# Patient Record
Sex: Female | Born: 1940 | Race: White | Hispanic: No | Smoking: Former smoker
Health system: Southern US, Community
[De-identification: ages and names within clinical notes are randomized; demographics above are authoritative.]

## PROBLEM LIST (undated history)

## (undated) DIAGNOSIS — E785 Hyperlipidemia, unspecified: Secondary | ICD-10-CM

## (undated) DIAGNOSIS — K589 Irritable bowel syndrome without diarrhea: Secondary | ICD-10-CM

## (undated) DIAGNOSIS — K5900 Constipation, unspecified: Secondary | ICD-10-CM

## (undated) DIAGNOSIS — K219 Gastro-esophageal reflux disease without esophagitis: Secondary | ICD-10-CM

## (undated) DIAGNOSIS — K222 Esophageal obstruction: Secondary | ICD-10-CM

## (undated) DIAGNOSIS — K449 Diaphragmatic hernia without obstruction or gangrene: Secondary | ICD-10-CM

## (undated) DIAGNOSIS — I7300 Raynaud's syndrome without gangrene: Secondary | ICD-10-CM

## (undated) DIAGNOSIS — E039 Hypothyroidism, unspecified: Secondary | ICD-10-CM

## (undated) DIAGNOSIS — G43009 Migraine without aura, not intractable, without status migrainosus: Secondary | ICD-10-CM

## (undated) DIAGNOSIS — Z9289 Personal history of other medical treatment: Secondary | ICD-10-CM

## (undated) DIAGNOSIS — R079 Chest pain, unspecified: Secondary | ICD-10-CM

## (undated) HISTORY — DX: Hypothyroidism, unspecified: E03.9

## (undated) HISTORY — DX: Diaphragmatic hernia without obstruction or gangrene: K44.9

## (undated) HISTORY — DX: Raynaud's syndrome without gangrene: I73.00

## (undated) HISTORY — DX: Personal history of other medical treatment: Z92.89

## (undated) HISTORY — DX: Esophageal obstruction: K22.2

## (undated) HISTORY — DX: Irritable bowel syndrome without diarrhea: K58.9

## (undated) HISTORY — DX: Hyperlipidemia, unspecified: E78.5

## (undated) HISTORY — DX: Chest pain, unspecified: R07.9

## (undated) HISTORY — PX: UPPER GASTROINTESTINAL ENDOSCOPY: SHX188

## (undated) HISTORY — DX: Gastro-esophageal reflux disease without esophagitis: K21.9

## (undated) HISTORY — DX: Constipation, unspecified: K59.00

## (undated) HISTORY — PX: COLONOSCOPY: SHX174

## (undated) HISTORY — DX: Migraine without aura, not intractable, without status migrainosus: G43.009

---

## 1968-11-22 HISTORY — PX: ECTOPIC PREGNANCY SURGERY: SHX613

## 1974-11-22 HISTORY — PX: ABDOMINAL HYSTERECTOMY: SHX81

## 1978-11-22 HISTORY — PX: RHINOPLASTY: SUR1284

## 1988-11-22 HISTORY — PX: TONSILLECTOMY: SUR1361

## 1995-11-23 HISTORY — PX: OTHER SURGICAL HISTORY: SHX169

## 1999-11-13 ENCOUNTER — Other Ambulatory Visit: Admission: RE | Admit: 1999-11-13 | Discharge: 1999-11-13 | Payer: Self-pay | Admitting: Obstetrics and Gynecology

## 1999-12-31 ENCOUNTER — Encounter: Payer: Self-pay | Admitting: Obstetrics and Gynecology

## 1999-12-31 ENCOUNTER — Encounter: Admission: RE | Admit: 1999-12-31 | Discharge: 1999-12-31 | Payer: Self-pay | Admitting: Obstetrics and Gynecology

## 2000-04-07 ENCOUNTER — Other Ambulatory Visit: Admission: RE | Admit: 2000-04-07 | Discharge: 2000-04-07 | Payer: Self-pay | Admitting: Gastroenterology

## 2000-07-19 ENCOUNTER — Encounter: Payer: Self-pay | Admitting: Obstetrics and Gynecology

## 2000-07-19 ENCOUNTER — Encounter: Admission: RE | Admit: 2000-07-19 | Discharge: 2000-07-19 | Payer: Self-pay | Admitting: Obstetrics and Gynecology

## 2000-10-05 ENCOUNTER — Ambulatory Visit (HOSPITAL_COMMUNITY): Admission: RE | Admit: 2000-10-05 | Discharge: 2000-10-05 | Payer: Self-pay | Admitting: Neurology

## 2000-10-05 ENCOUNTER — Encounter: Payer: Self-pay | Admitting: Neurology

## 2000-12-06 ENCOUNTER — Other Ambulatory Visit: Admission: RE | Admit: 2000-12-06 | Discharge: 2000-12-06 | Payer: Self-pay | Admitting: Obstetrics and Gynecology

## 2001-01-06 ENCOUNTER — Encounter: Admission: RE | Admit: 2001-01-06 | Discharge: 2001-01-06 | Payer: Self-pay | Admitting: Obstetrics and Gynecology

## 2001-01-06 ENCOUNTER — Encounter: Payer: Self-pay | Admitting: Obstetrics and Gynecology

## 2001-01-16 ENCOUNTER — Encounter: Payer: Self-pay | Admitting: Obstetrics and Gynecology

## 2001-01-16 ENCOUNTER — Encounter: Admission: RE | Admit: 2001-01-16 | Discharge: 2001-01-16 | Payer: Self-pay | Admitting: Obstetrics and Gynecology

## 2001-04-16 ENCOUNTER — Inpatient Hospital Stay (HOSPITAL_COMMUNITY): Admission: EM | Admit: 2001-04-16 | Discharge: 2001-04-18 | Payer: Self-pay

## 2001-04-16 ENCOUNTER — Encounter: Payer: Self-pay | Admitting: Pediatrics

## 2001-04-17 ENCOUNTER — Encounter: Payer: Self-pay | Admitting: Pediatrics

## 2001-08-16 ENCOUNTER — Encounter (INDEPENDENT_AMBULATORY_CARE_PROVIDER_SITE_OTHER): Payer: Self-pay | Admitting: Specialist

## 2001-08-16 ENCOUNTER — Encounter: Payer: Self-pay | Admitting: Gastroenterology

## 2001-08-16 ENCOUNTER — Other Ambulatory Visit: Admission: RE | Admit: 2001-08-16 | Discharge: 2001-08-16 | Payer: Self-pay | Admitting: Gastroenterology

## 2001-08-16 LAB — HM COLONOSCOPY

## 2002-01-24 ENCOUNTER — Encounter: Payer: Self-pay | Admitting: Internal Medicine

## 2002-01-24 ENCOUNTER — Encounter: Admission: RE | Admit: 2002-01-24 | Discharge: 2002-01-24 | Payer: Self-pay | Admitting: Internal Medicine

## 2002-01-31 ENCOUNTER — Encounter: Payer: Self-pay | Admitting: Internal Medicine

## 2002-01-31 ENCOUNTER — Encounter: Admission: RE | Admit: 2002-01-31 | Discharge: 2002-01-31 | Payer: Self-pay | Admitting: Internal Medicine

## 2003-01-11 ENCOUNTER — Encounter: Payer: Self-pay | Admitting: Internal Medicine

## 2003-01-11 ENCOUNTER — Encounter: Admission: RE | Admit: 2003-01-11 | Discharge: 2003-01-11 | Payer: Self-pay | Admitting: Internal Medicine

## 2003-02-07 ENCOUNTER — Encounter: Admission: RE | Admit: 2003-02-07 | Discharge: 2003-02-07 | Payer: Self-pay | Admitting: Internal Medicine

## 2003-02-07 ENCOUNTER — Encounter: Payer: Self-pay | Admitting: Internal Medicine

## 2004-02-17 ENCOUNTER — Encounter: Admission: RE | Admit: 2004-02-17 | Discharge: 2004-02-17 | Payer: Self-pay | Admitting: Internal Medicine

## 2004-06-12 ENCOUNTER — Encounter: Admission: RE | Admit: 2004-06-12 | Discharge: 2004-06-12 | Payer: Self-pay | Admitting: Internal Medicine

## 2004-06-30 ENCOUNTER — Encounter: Admission: RE | Admit: 2004-06-30 | Discharge: 2004-06-30 | Payer: Self-pay | Admitting: Internal Medicine

## 2005-04-29 ENCOUNTER — Encounter: Admission: RE | Admit: 2005-04-29 | Discharge: 2005-04-29 | Payer: Self-pay | Admitting: Sports Medicine

## 2005-06-10 ENCOUNTER — Encounter: Admission: RE | Admit: 2005-06-10 | Discharge: 2005-06-10 | Payer: Self-pay | Admitting: Internal Medicine

## 2005-09-10 ENCOUNTER — Ambulatory Visit: Payer: Self-pay | Admitting: Gastroenterology

## 2005-09-30 ENCOUNTER — Ambulatory Visit: Payer: Self-pay | Admitting: Gastroenterology

## 2006-03-10 ENCOUNTER — Ambulatory Visit: Payer: Self-pay | Admitting: Gastroenterology

## 2006-06-16 ENCOUNTER — Encounter: Admission: RE | Admit: 2006-06-16 | Discharge: 2006-06-16 | Payer: Self-pay | Admitting: Internal Medicine

## 2007-01-23 ENCOUNTER — Ambulatory Visit: Payer: Self-pay | Admitting: Internal Medicine

## 2007-06-20 ENCOUNTER — Encounter: Admission: RE | Admit: 2007-06-20 | Discharge: 2007-06-20 | Payer: Self-pay | Admitting: Internal Medicine

## 2007-10-15 ENCOUNTER — Ambulatory Visit: Payer: Self-pay | Admitting: Cardiology

## 2007-10-15 ENCOUNTER — Emergency Department (HOSPITAL_COMMUNITY): Admission: EM | Admit: 2007-10-15 | Discharge: 2007-10-15 | Payer: Self-pay | Admitting: Emergency Medicine

## 2007-11-08 ENCOUNTER — Ambulatory Visit: Payer: Self-pay | Admitting: Cardiology

## 2007-11-13 ENCOUNTER — Ambulatory Visit: Payer: Self-pay

## 2008-01-11 ENCOUNTER — Encounter: Payer: Self-pay | Admitting: Internal Medicine

## 2008-07-18 ENCOUNTER — Encounter: Admission: RE | Admit: 2008-07-18 | Discharge: 2008-07-18 | Payer: Self-pay | Admitting: Internal Medicine

## 2009-01-09 ENCOUNTER — Encounter: Payer: Self-pay | Admitting: Internal Medicine

## 2009-04-30 DIAGNOSIS — K222 Esophageal obstruction: Secondary | ICD-10-CM

## 2009-04-30 DIAGNOSIS — K449 Diaphragmatic hernia without obstruction or gangrene: Secondary | ICD-10-CM

## 2009-04-30 DIAGNOSIS — I7300 Raynaud's syndrome without gangrene: Secondary | ICD-10-CM | POA: Insufficient documentation

## 2009-04-30 DIAGNOSIS — K5900 Constipation, unspecified: Secondary | ICD-10-CM

## 2009-04-30 DIAGNOSIS — R079 Chest pain, unspecified: Secondary | ICD-10-CM

## 2009-04-30 DIAGNOSIS — K589 Irritable bowel syndrome without diarrhea: Secondary | ICD-10-CM | POA: Insufficient documentation

## 2009-04-30 DIAGNOSIS — K219 Gastro-esophageal reflux disease without esophagitis: Secondary | ICD-10-CM | POA: Insufficient documentation

## 2009-04-30 DIAGNOSIS — G43009 Migraine without aura, not intractable, without status migrainosus: Secondary | ICD-10-CM | POA: Insufficient documentation

## 2009-04-30 DIAGNOSIS — E039 Hypothyroidism, unspecified: Secondary | ICD-10-CM

## 2009-04-30 HISTORY — DX: Raynaud's syndrome without gangrene: I73.00

## 2009-04-30 HISTORY — DX: Irritable bowel syndrome, unspecified: K58.9

## 2009-04-30 HISTORY — DX: Migraine without aura, not intractable, without status migrainosus: G43.009

## 2009-04-30 HISTORY — DX: Chest pain, unspecified: R07.9

## 2009-04-30 HISTORY — DX: Esophageal obstruction: K22.2

## 2009-04-30 HISTORY — DX: Hypothyroidism, unspecified: E03.9

## 2009-04-30 HISTORY — DX: Gastro-esophageal reflux disease without esophagitis: K21.9

## 2009-04-30 HISTORY — DX: Diaphragmatic hernia without obstruction or gangrene: K44.9

## 2009-04-30 HISTORY — DX: Constipation, unspecified: K59.00

## 2009-05-01 ENCOUNTER — Ambulatory Visit: Payer: Self-pay | Admitting: Gastroenterology

## 2009-06-20 ENCOUNTER — Encounter: Admission: RE | Admit: 2009-06-20 | Discharge: 2009-06-20 | Payer: Self-pay | Admitting: Family Medicine

## 2009-07-21 ENCOUNTER — Encounter: Admission: RE | Admit: 2009-07-21 | Discharge: 2009-07-21 | Payer: Self-pay | Admitting: Internal Medicine

## 2010-01-12 ENCOUNTER — Ambulatory Visit: Payer: Self-pay | Admitting: Internal Medicine

## 2010-01-14 ENCOUNTER — Telehealth: Payer: Self-pay | Admitting: Internal Medicine

## 2010-01-14 ENCOUNTER — Encounter: Payer: Self-pay | Admitting: Internal Medicine

## 2010-04-13 ENCOUNTER — Encounter: Payer: Self-pay | Admitting: Internal Medicine

## 2010-07-23 ENCOUNTER — Encounter: Admission: RE | Admit: 2010-07-23 | Discharge: 2010-07-23 | Payer: Self-pay | Admitting: Internal Medicine

## 2010-09-28 ENCOUNTER — Encounter: Payer: Self-pay | Admitting: Internal Medicine

## 2010-09-30 ENCOUNTER — Encounter: Payer: Self-pay | Admitting: Internal Medicine

## 2010-09-30 ENCOUNTER — Ambulatory Visit: Payer: Self-pay | Admitting: Internal Medicine

## 2010-09-30 LAB — CONVERTED CEMR LAB
ALT: 20 units/L (ref 0–35)
Albumin: 3.7 g/dL (ref 3.5–5.2)
BUN: 12 mg/dL (ref 6–23)
CO2: 32 meq/L (ref 19–32)
Chloride: 104 meq/L (ref 96–112)
Creatinine, Ser: 0.6 mg/dL (ref 0.4–1.2)
Eosinophils Absolute: 0.1 10*3/uL (ref 0.0–0.7)
Eosinophils Relative: 1.6 % (ref 0.0–5.0)
Glucose, Bld: 103 mg/dL — ABNORMAL HIGH (ref 70–99)
Hemoglobin: 12.8 g/dL (ref 12.0–15.0)
Monocytes Absolute: 0.5 10*3/uL (ref 0.1–1.0)
Neutrophils Relative %: 55.5 % (ref 43.0–77.0)
Potassium: 3.9 meq/L (ref 3.5–5.1)
RDW: 13.6 % (ref 11.5–14.6)
Total Protein: 6.1 g/dL (ref 6.0–8.3)

## 2010-10-01 ENCOUNTER — Encounter: Payer: Self-pay | Admitting: Internal Medicine

## 2010-10-07 ENCOUNTER — Ambulatory Visit: Payer: Self-pay | Admitting: Internal Medicine

## 2010-10-07 DIAGNOSIS — J020 Streptococcal pharyngitis: Secondary | ICD-10-CM

## 2010-10-07 LAB — CONVERTED CEMR LAB: Rapid Strep: POSITIVE

## 2010-10-28 ENCOUNTER — Encounter (INDEPENDENT_AMBULATORY_CARE_PROVIDER_SITE_OTHER): Payer: Self-pay | Admitting: *Deleted

## 2010-10-28 ENCOUNTER — Telehealth: Payer: Self-pay | Admitting: Gastroenterology

## 2010-10-29 ENCOUNTER — Ambulatory Visit: Payer: Self-pay | Admitting: Gastroenterology

## 2010-11-04 ENCOUNTER — Ambulatory Visit: Payer: Self-pay | Admitting: Gastroenterology

## 2010-12-24 NOTE — Progress Notes (Signed)
Summary: dysphagia   Phone Note Call from Patient Call back at Work Phone [phone]   Caller: Patient Call For: Dr. Jarold Motto Reason for Call: Talk to Nurse Summary of Call: after taking thyroid med and Protonix, pt feels like it is stuck in her esophagus Initial call taken by: Vallarie Mare,  October 28, 2010 9:44 AM  Follow-up for Phone Call        I spoke with the patient and states yesterday when taking her protonix and synthroid she feel one got "stuck".  She reports chest discomfort "like something is stuck there".  She reports that she has no history of recent dysphagia to liquids or solids.  Is tolerating a normal diet eating and drinking normally yesterday and this am.  Dr Jarold Motto please advise. Follow-up by: Darcey Nora RN, CGRN,  October 28, 2010 9:59 AM  Additional Follow-up for Phone Call Additional follow up Details #1::        HX. OS SCHATZSKI RING...NEEDS REPEAT ENDO/DILATION Additional Follow-up by: Mardella Layman MD [facility],  October 28, 2010 11:46 AM    Additional Follow-up for Phone Call Additional follow up Details #2::    EGD scheduled for 11/04/10 11:30, pre-visit 10/29/10 10:00 Follow-up by: Darcey Nora RN, CGRN,  October 28, 2010 2:54 PM

## 2010-12-24 NOTE — Procedures (Signed)
Summary: Upper Endoscopy  Patient: [name] Note: All result statuses are Final unless otherwise noted.  Tests: (1) Upper Endoscopy (EGD)   EGD Upper Endoscopy       DONE     [facility]     [address]     [place], [place]  [address]           ENDOSCOPY PROCEDURE REPORT           PATIENT:  [name], [name]  MR#:  [id number]     BIRTHDATE:  04-07-1941, 69 yrs. old  GENDER:  female           ENDOSCOPIST:  Vania Rea. Jarold Motto, MD, [facility]     Referred by:           PROCEDURE DATE:  11/04/2010     PROCEDURE:  EGD, diagnostic 43235, Maloney Dilation of Esophagus     ASA CLASS:  Class II     INDICATIONS:  dysphagia           MEDICATIONS:   Fentanyl 50 mcg IV, Versed 4 mg IV     TOPICAL ANESTHETIC:           DESCRIPTION OF PROCEDURE:   After the risks benefits and     alternatives of the procedure were thoroughly explained, informed     consent was obtained.  The LB GIF-H180 [id number] endoscope was     introduced through the mouth and advanced to the second portion of     the duodenum, without limitations.  The instrument was slowly     withdrawn as the mucosa was fully examined.     <<PROCEDUREIMAGES>>           A Schatzki's ring was found at the gastroesophageal junction.     Dilation with maloney dilator #73F PASSED EASILY.NO HEME OR PAIN.     Otherwise the examination was normal.    Retroflexed views revealed     a hiatal hernia.  3 CM HH NOTED,NO ESOPHAGITIS.  The scope was     then withdrawn from the patient and the procedure completed.           COMPLICATIONS:  None           ENDOSCOPIC IMPRESSION:     1) Schatzki's ring at the gastroesophageal junction     2) Otherwise normal examination     3) A hiatal hernia     RECOMMENDATIONS:     1) continue current medications     2) post dilation instructions     DILATE PRN.           REPEAT EXAM:  No           ______________________________     Vania Rea. Jarold Motto, MD, [name]           CC:  [name],  MD           n.     [name] Kitchen   Vania Rea. Sadat Sliwa at 11/04/2010 12:30 PM           Cleora Fleet, [phone]  Note: An exclamation mark (!) indicates a result that was not dispersed into the flowsheet. Document Creation Date: 11/04/2010 1:09 PM _______________________________________________________________________  (1) Order result status: Final Collection or observation date-time: 11/04/2010 12:24 Requested date-time:  Receipt date-time:  Reported date-time:  Referring Physician:   Ordering Physician: Sheryn Bison [phone]) Specimen Source:  Source: Launa Grill Order Number: [id number] Lab site:

## 2010-12-24 NOTE — Letter (Signed)
Summary: Patient's Medical Hx/Patient  Patient's Medical Hx/Patient   Imported By: Sherian Rein 10/05/2010 10:45:45  _____________________________________________________________________  External Attachment:    Type:   Image     Comment:   External Document

## 2010-12-24 NOTE — Progress Notes (Signed)
Summary: Calling about a medication   Phone Note Call from Patient Call back at [facility] Phone [phone]   Caller: Patient Summary of Call: Pt calling regarding a medication that Dr.Christean Silvestri was going to call in for her. Initial call taken by: Judie Grieve,  January 14, 2010 9:31 AM  Follow-up for Phone Call        Called home and work phones .Marland Kitchen..no answer at either number...unable to leave a message. Follow-up by: J REISS RN     Appended Document: Calling about a medication Patient called back...she states that Dr.Keron Koffman forgot to send in a script for Protonix 40 mg for her at her office visit the other day...she wants it sent to [facility] in [place]..order completed...will let Dr.Mela Perham know.

## 2010-12-24 NOTE — Assessment & Plan Note (Signed)
Summary: f2y per pt call/jss  Medications Added ARMOUR THYROID 120 MG TABS (THYROID) 1 tab once daily RA VITAMIN A 67893 UNIT CAPS (VITAMIN A) take one by mouth once daily prn * CITRAMINS Take 2 tablets daily( pt out) * ZINC daily * VITAMIN D daily * VITAMIN K daily XANAX 0.25 MG TABS (ALPRAZOLAM) as needed      Allergies Added: NKDA  Visit Type:  Follow-up Primary Provider:  Allena Napoleon, MD   History of Present Illness: [name] is a 70 year old with a hisotry of chest pain,  She was last seen in 2008. She said that last Thursday night at about 2 AM she developed chest pressure.  She denied shortness of breath.  No diaphoresis.  The discomfort went away on its on.  ON Friday she felt fine. She notes occasional L arm pain not associated with activity.  Occasional back pain She is activel.  Works out 3x per week on an ellipiticall.  No change in her ability to do this.  Current Medications (verified): 1)  Aspirin 325 Mg Tabs (Aspirin) .... Take One By Mouth Once Daily 2)  Armour Thyroid 120 Mg Tabs (Thyroid) .Marland Kitchen.. 1 Tab Once Daily 3)  Co Q-10 120 Mg Caps (Coenzyme Q10) .... Take One By Mouth Once Daily 4)  Fish Oil 1000 Mg Caps (Omega-3 Fatty Acids) .... Take One By Mouth Once Daily 5)  Ra Vitamin A 81017 Unit Caps (Vitamin A) .... Take One By Mouth Once Daily Prn 6)  B-12 1500 Mcg Cr-Tabs (Cyanocobalamin) .... Take One By Mouth Once Daily 7)  Vitamin C 1000 Mg Tabs (Ascorbic Acid) .... Take One By Mouth Once Daily 8)  Calmatrix 1800mg  .... Once Daily 9)  Ultra Synergist E 500iu .... Once Daily 10)  Citramins .... Take 2 Tablets Daily( Pt Out) 11)  Selenium 200 Mcg Tabs (Selenium) .... Once Daily 12)  Zinc .... Daily 13)  Vitamin D .... Daily 14)  Vitamin K .... Daily 15)  Xanax 0.25 Mg Tabs (Alprazolam) .... As Needed  Allergies (verified): No Known Drug Allergies  Past History:  Past Medical History: Last updated: 04/30/2009 Current Problems:  CHEST PAIN  (ICD-786.50) COMMON MIGRAINE (ICD-346.10) HYPOTHYROIDISM (ICD-244.9) RAYNAUD'S DISEASE (ICD-443.0) HIATAL HERNIA (ICD-553.3) GERD (ICD-530.81) ESOPHAGEAL STRICTURE (ICD-530.3) IRRITABLE BOWEL SYNDROME (ICD-564.1) CONSTIPATION (ICD-564.00)  Past Surgical History: Last updated: 04/30/2009 Hysterectomy 1976 Tonsillectomy 1990 ectopic preg 1970 Rhinoplasty 1980 Foot surgery 1997  Social History: Last updated: 04/30/2009 Patient is a former smoker. quit 1985 Alcohol Use - yes 1-2 per week Illicit Drug Use - no Patient gets regular exercise. Occupation: Nature conservation officer Married with 2 children  Review of Systems       All systems reivewed.  Negative to the above problem except as noted above.  Vital Signs:  Patient profile:   70 year old female Height:      60 inches Weight:      122 pounds BMI:     23.91 Pulse rate:   96 / minute BP sitting:   119 / 64  (left arm) Cuff size:   regular  Vitals Entered By: Burnett Kanaris, CNA (January 12, 2010 2:13 PM)  Physical Exam  Additional Exam:  pateint is in NAD HEENT:  Normocephalic, atraumatic. EOMI, PERRLA.  Neck: JVP is normal. No thyromegaly. No bruits.  Lungs: clear to auscultation. No rales no wheezes.  Heart: Regular rate and rhythm. Normal S1, S2. No S3.   No significant murmurs. PMI not displaced.  Abdomen:  Supple, nontender. Normal bowel sounds. No masses. No hepatomegaly.  Extremities:   Good distal pulses throughout. No lower extremity edema.  Musculoskeletal :moving all extremities.  Neuro:   alert and oriented x3.    EKG  Procedure date:  01/12/2010  Findings:      NSR>  96 bpm.  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  I encouraged her to stay active.  She has had a normal myoview in 12/08. Her updated medication list for this problem includes:    Aspirin 325 Mg Tabs (Aspirin) .Marland Kitchen... Take  one by mouth once daily  Orders: EKG w/ Interpretation (93000)  Patient Instructions: 1)  Continue to stay active.  f/u as needed.

## 2010-12-24 NOTE — Letter (Signed)
Summary: Results Follow-up Letter  [facility]  [address] [place], [place] [address]   Phone: [phone]  Fax: [phone]    10/01/2010  [address] [place], [place]  [address]  Dear Ms. Mayford Knife,   The following are the results of your recent test(s):  Test     Result     Thyroid     dose is too high CBC       normal Liver/kidney   normal Chest xray     normal   _________________________________________________________  Please call for an appointment soon _________________________________________________________ _________________________________________________________ _________________________________________________________  Sincerely,  Sanda Linger MD [facility]

## 2010-12-24 NOTE — Assessment & Plan Note (Signed)
Summary: NEW/ SECURE HORIZIONS/[employer]  #   Vital Signs:  Patient profile:   70 year old female Menstrual status:  hysterectomy Height:      60 inches Weight:      120 pounds BMI:     23.52 O2 Sat:      99 % on Room air Temp:     98.6 degrees F oral Pulse rate:   95 / minute Pulse rhythm:   regular Resp:     16 per minute BP sitting:   126 / 70  (left arm) Cuff size:   regular  Vitals Entered By: Alysia Penna (September 30, 2010 2:44 PM)  O2 Flow:  Room air CC: pt here as new pt. /cp sma Is Patient Diabetic? No Pain Assessment Patient in pain? no          Menstrual Status hysterectomy   Primary Care Catelin Manthe:  Allena Napoleon, MD  CC:  pt here as new pt. /cp sma.  History of Present Illness:       This is a 70 year old female who presents with Chest pain.  The symptoms began 4 weeks ago.  On a scale of 1 to 10, the intensity is described as a 2.  The patient reports resting chest pain, but denies exertional chest pain, nausea, vomiting, diaphoresis, shortness of breath, palpitations, dizziness, light headedness, syncope, and indigestion.  The pain is described as intermittent and sharp.  The pain is located in the left anterior chest and left upper chest.  The pain radiates to the back.  Episodes of chest pain last 2-5 minutes.  The pain is brought on or made worse by upper body movement.    Dyspepsia History:      She has no alarm features of dyspepsia including no history of melena, hematochezia, dysphagia, persistent vomiting, or involuntary weight loss > 5%.  There is a prior history of GERD.  The patient does not have a prior history of documented ulcer disease.  The dominant symptom is heartburn or acid reflux.  An H-2 blocker medication is currently being taken.  She notes that the symptoms have improved with the H-2 blocker therapy.  Symptoms have not persisted after 4 weeks of H-2 blocker treatment.  A prior EGD has been done.    Preventive Screening-Counseling &  Management  Alcohol-Tobacco     Alcohol drinks/day: <1     Alcohol type: wine     >5/day in last 3 mos: no     Alcohol Counseling: not indicated; use of alcohol is not excessive or problematic     Feels need to cut down: no     Feels annoyed by complaints: no     Feels guilty re: drinking: no     Needs 'eye opener' in am: no     Smoking Status: quit     Tobacco Counseling: to remain off tobacco products  Hep-HIV-STD-Contraception     Hepatitis Risk: no risk noted     HIV Risk: no risk noted     STD Risk: no risk noted      Sexual History:  currently monogamous.        Drug Use:  never.        Blood Transfusions:  no.    Medications Prior to Update: 1)  Aspirin 325 Mg Tabs (Aspirin) .... Take One By Mouth Once Daily 2)  Armour Thyroid 120 Mg Tabs (Thyroid) .Marland Kitchen.. 1 Tab Once Daily 3)  Co Q-10 120 Mg Caps (  Coenzyme Q10) .... Take One By Mouth Once Daily 4)  Fish Oil 1000 Mg Caps (Omega-3 Fatty Acids) .... Take One By Mouth Once Daily 5)  Ra Vitamin A 60454 Unit Caps (Vitamin A) .... Take One By Mouth Once Daily Prn 6)  B-12 1500 Mcg Cr-Tabs (Cyanocobalamin) .... Take One By Mouth Once Daily 7)  Vitamin C 1000 Mg Tabs (Ascorbic Acid) .... Take One By Mouth Once Daily 8)  Calmatrix 1800mg  .... Once Daily 9)  Ultra Synergist E 500iu .... Once Daily 10)  Citramins .... Take 2 Tablets Daily( Pt Out) 11)  Selenium 200 Mcg Tabs (Selenium) .... Once Daily 12)  Zinc .... Daily 13)  Vitamin D .... Daily 14)  Vitamin K .... Daily 15)  Xanax 0.25 Mg Tabs (Alprazolam) .... As Needed 16)  Protonix 40 Mg Tbec (Pantoprazole Sodium) .Marland Kitchen.. 1 Pill Every Day  Current Medications (verified): 1)  Aspirin 325 Mg Tabs (Aspirin) .... Take One By Mouth Once Daily 2)  Armour Thyroid 120 Mg Tabs (Thyroid) .Marland Kitchen.. 1 Tab Once Daily 3)  Co Q-10 120 Mg Caps (Coenzyme Q10) .... Take One By Mouth Once Daily 4)  Fish Oil 1000 Mg Caps (Omega-3 Fatty Acids) .... Take One By Mouth Once Daily 5)  Ra Vitamin A 09811  Unit Caps (Vitamin A) .... Take One By Mouth Once Daily Prn 6)  B-12 1500 Mcg Cr-Tabs (Cyanocobalamin) .... Take One By Mouth Once Daily 7)  Vitamin C 1000 Mg Tabs (Ascorbic Acid) .... Take One By Mouth Once Daily 8)  Calmatrix 1800mg  .... Once Daily 9)  Ultra Synergist E 500iu .... Once Daily 10)  Citramins .... Take 2 Tablets Daily( Pt Out) 11)  Selenium 200 Mcg Tabs (Selenium) .... Once Daily 12)  Zinc .... Daily 13)  Vitamin D .... Daily 14)  Vitamin K .... Daily 15)  Xanax 0.25 Mg Tabs (Alprazolam) .... As Needed 16)  Protonix 40 Mg Tbec (Pantoprazole Sodium) .Marland Kitchen.. 1 Pill Every Day  Allergies (verified): No Known Drug Allergies  Past History:  Past Medical History: Last updated: 04/30/2009 Current Problems:  CHEST PAIN (ICD-786.50) COMMON MIGRAINE (ICD-346.10) HYPOTHYROIDISM (ICD-244.9) RAYNAUD'S DISEASE (ICD-443.0) HIATAL HERNIA (ICD-553.3) GERD (ICD-530.81) ESOPHAGEAL STRICTURE (ICD-530.3) IRRITABLE BOWEL SYNDROME (ICD-564.1) CONSTIPATION (ICD-564.00)  Past Surgical History: Last updated: 04/30/2009 Hysterectomy 1976 Tonsillectomy 1990 ectopic preg 1970 Rhinoplasty 1980 Foot surgery 1997  Family History: Last updated: 04/30/2009 Family History of Breast Cancer: sister No FH of Colon Cancer: Family History of Diabetes: grandmother, aunt, uncle Family History of Heart Disease: father, paternal grandparents Family History of Lung cancer: aunt  Social History: Last updated: 04/30/2009 Patient is a former smoker. quit 1985 Alcohol Use - yes 1-2 per week Illicit Drug Use - no Patient gets regular exercise. Occupation: Nature conservation officer Married with 2 children  Risk Factors: Alcohol Use: <1 (09/30/2010) >5 drinks/d w/in last 3 months: no (09/30/2010) Exercise: yes (04/30/2009)  Risk Factors: Smoking Status: quit (09/30/2010)  Family History: Reviewed history from 04/30/2009 and no changes required. Family History of Breast Cancer: sister No FH of  Colon Cancer: Family History of Diabetes: grandmother, aunt, uncle Family History of Heart Disease: father, paternal grandparents Family History of Lung cancer: aunt  Social History: Reviewed history from 04/30/2009 and no changes required. Patient is a former smoker. quit 1985 Alcohol Use - yes 1-2 per week Illicit Drug Use - no Patient gets regular exercise. 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  The patient denies decreased hearing, hoarseness, syncope, dyspnea on exertion, peripheral edema, prolonged cough, headaches, hemoptysis, abdominal pain, melena, hematochezia, severe indigestion/heartburn, hematuria, suspicious skin lesions, transient blindness, difficulty walking, depression, abnormal bleeding, enlarged lymph nodes, angioedema, and breast masses.   General:  Denies chills, fatigue, fever, loss of appetite, malaise, and sleep disorder. CV:  Complains of chest pain or discomfort; denies bluish discoloration of lips or nails, difficulty breathing at night, difficulty breathing while lying down, fainting, fatigue, leg cramps with exertion, lightheadness, near fainting, palpitations, shortness of breath with exertion, and swelling of feet. Resp:  Denies chest pain with inspiration, cough, coughing up blood, pleuritic, shortness of breath, sputum productive, and wheezing. Endo:  Denies cold intolerance, excessive hunger, excessive thirst, excessive urination, heat intolerance, polyuria, and weight change.  Physical Exam  General:  alert, well-developed, well-nourished, well-hydrated, appropriate dress, normal appearance, healthy-appearing, cooperative to examination, and good hygiene.   Head:  normocephalic, atraumatic, no abnormalities  observed, and no abnormalities palpated.   Eyes:  vision grossly intact, pupils equal, pupils round, and pupils reactive to light.   Ears:  R ear normal and L ear normal.   Nose:  External nasal examination shows no deformity or inflammation. Nasal mucosa are pink and moist without lesions or exudates. Mouth:  Oral mucosa and oropharynx without lesions or exudates.  Teeth in good repair. Neck:  supple, full ROM, no masses, no thyromegaly, no JVD, normal carotid upstroke, no carotid bruits, no cervical lymphadenopathy, and no neck tenderness.   Chest Wall:  no deformities, no tenderness, and no mass.   Breasts:  No mass, nodules, thickening, tenderness, bulging, retraction, inflamation, nipple discharge or skin changes noted.   Lungs:  normal respiratory effort, no intercostal retractions, no accessory muscle use, normal breath sounds, no dullness, no fremitus, no crackles, and no wheezes.   Heart:  normal rate, regular rhythm, no murmur, no gallop, no rub, and no JVD.   Abdomen:  soft, non-tender, normal bowel sounds, no distention, no masses, no guarding, no rigidity, no rebound tenderness, no abdominal hernia, no inguinal hernia, no hepatomegaly, and no splenomegaly.   Msk:  normal ROM, no joint tenderness, no joint swelling, no joint warmth, no redness over joints, no joint deformities, no joint instability, and no crepitation.   Pulses:  R and L carotid,radial,femoral,dorsalis pedis and posterior tibial pulses are full and equal bilaterally Extremities:  No clubbing, cyanosis, edema, or deformity noted with normal full range of motion of all joints.   Neurologic:  No cranial nerve deficits noted. Station and gait are normal. Plantar reflexes are down-going bilaterally. DTRs are symmetrical throughout. Sensory, motor and coordinative functions appear intact. Skin:  turgor normal, color normal, no rashes, no suspicious lesions, no ecchymoses, no petechiae, no purpura, no ulcerations, and no edema.     Cervical Nodes:  no anterior cervical adenopathy and no posterior cervical adenopathy.   Axillary Nodes:  no R axillary adenopathy and no L axillary adenopathy.   Inguinal Nodes:  no R inguinal adenopathy and no L inguinal adenopathy.   Psych:  Cognition and judgment appear intact. Alert and cooperative with normal attention span and concentration. No apparent delusions, illusions, hallucinations   Impression & Recommendations:  Problem # 1:  CHEST PAIN (ICD-786.50) Assessment Deteriorated she has had an extensive prior  work-up for cardiac pain and all was negative, today her EKG is normal, I will look at a chest xray for structural and pulmonary lesions and check labs for PE and other systemic causes, I think this is musculoskeletal and that she should take tylenol +/- motrin Orders: Venipuncture (41324) TLB-BMP (Basic Metabolic Panel-BMET) (80048-METABOL) TLB-CBC Platelet - w/Differential (85025-CBCD) TLB-Hepatic/Liver Function Pnl (80076-HEPATIC) TLB-TSH (Thyroid Stimulating Hormone) (40102-VOZ) T-D-Dimer Fibrin Derivatives Quantitive (36644-03474) T-2 View CXR (71020TC) EKG w/ Interpretation (93000)  Problem # 2:  HYPOTHYROIDISM (ICD-244.9) Assessment: Unchanged  Her updated medication list for this problem includes:    Armour Thyroid 120 Mg Tabs (Thyroid) .Marland Kitchen... 1 tab once daily  Orders: Venipuncture (25956) TLB-BMP (Basic Metabolic Panel-BMET) (80048-METABOL) TLB-CBC Platelet - w/Differential (85025-CBCD) TLB-Hepatic/Liver Function Pnl (80076-HEPATIC) TLB-TSH (Thyroid Stimulating Hormone) (38756-EPP) T-D-Dimer Fibrin Derivatives Quantitive ([id number])  Problem # 3:  GERD (ICD-530.81) Assessment: Improved  Her updated medication list for this problem includes:    Protonix 40 Mg Tbec (Pantoprazole sodium) .Marland Kitchen... 1 pill every day  EGD: Location: [facility]   (08/16/2001)  Complete Medication List: 1)  Aspirin 325 Mg Tabs (Aspirin) .... Take one by  mouth once daily 2)  Armour Thyroid 120 Mg Tabs (Thyroid) .Marland Kitchen.. 1 tab once daily 3)  Co Q-10 120 Mg Caps (Coenzyme q10) .... Take one by mouth once daily 4)  Fish Oil 1000 Mg Caps (Omega-3 fatty acids) .... Take one by mouth once daily 5)  Ra Vitamin A 06301 Unit Caps (Vitamin a) .... Take one by mouth once daily prn 6)  B-12 1500 Mcg Cr-tabs (Cyanocobalamin) .... Take one by mouth once daily 7)  Vitamin C 1000 Mg Tabs (Ascorbic acid) .... Take one by mouth once daily 8)  Calmatrix 1800mg   .... Once daily 9)  Ultra Synergist E 500iu  .... Once daily 10)  Citramins  .... Take 2 tablets daily( pt out) 11)  Selenium 200 Mcg Tabs (Selenium) .... Once daily 12)  Zinc  .... Daily 13)  Vitamin D  .... Daily 14)  Vitamin K  .... Daily 15)  Xanax 0.25 Mg Tabs (Alprazolam) .... As needed 16)  Protonix 40 Mg Tbec (Pantoprazole sodium) .Marland Kitchen.. 1 pill every day  Mammogram Screening:    Last Mammogram:  07/23/2010  Mammogram Results:    Date of Exam:  07/23/2010    Results:  Normal Bilateral  Osteoporosis Risk Assessment:  Risk Factors for Fracture or Low Bone Density:   Race (White or Asian):     yes   Smoking status:       quit   Thyroid replacement:     yes   Thyroid disease:     yes  Immunization & Chemoprophylaxis:    Tetanus vaccine: Tdap  (09/28/2010)    Influenza vaccine: Fluvax MCR  (08/13/2010)    Pneumovax: Pneumovax  (08/13/2010)   Patient Instructions: 1)  Please schedule a follow-up appointment in 1 month. 2)  Avoid foods high in acid (tomatoes, citrus juices, spicy foods). Avoid eating within two hours of lying down or before exercising. Do not over eat; try smaller more frequent meals. Elevate head of bed twelve inches when sleeping. 3)  Take 650-1000mg  of Tylenol every 4-6 hours as needed for relief of pain or comfort of fever AVOID taking more than 4000mg   in a 24 hour period (can cause liver damage in higher doses). 4)  Take 400-600mg  of Ibuprofen (Advil, Motrin) with food  every 4-6 hours as needed for relief of pain or comfort  of fever.   Orders Added: 1)  Venipuncture [36415] 2)  TLB-BMP (Basic Metabolic Panel-BMET) [80048-METABOL] 3)  TLB-CBC Platelet - w/Differential [85025-CBCD] 4)  TLB-Hepatic/Liver Function Pnl [80076-HEPATIC] 5)  TLB-TSH (Thyroid Stimulating Hormone) [84443-TSH] 6)  T-D-Dimer Fibrin Derivatives Quantitive [[id number]] 7)  T-2 View CXR [71020TC] 8)  Est. Patient Level IV [21308] 9)  EKG w/ Interpretation [93000]   Immunization History:  Tetanus/Td Immunization History:    Tetanus/Td:  historical (04/30/2008)    Tetanus/Td:  tdap (09/28/2010)  Influenza Immunization History:    Influenza:  fluvax mcr (08/13/2010)  Pneumovax Immunization History:    Pneumovax:  pneumovax (08/13/2010)   Immunization History:  Tetanus/Td Immunization History:    Tetanus/Td:  Historical (04/30/2008)    Tetanus/Td:  Tdap (09/28/2010)  Influenza Immunization History:    Influenza:  Fluvax MCR (08/13/2010)  Pneumovax Immunization History:    Pneumovax:  Pneumovax (08/13/2010)

## 2010-12-24 NOTE — Assessment & Plan Note (Signed)
Summary: thyoid med dosage too high/#/cd   Vital Signs:  Patient profile:   70 year old female Menstrual status:  hysterectomy Height:      60 inches Weight:      121 pounds BMI:     23.72 O2 Sat:      99 % on Room air Temp:     98.1 degrees F oral Pulse rate:   80 / minute Pulse rhythm:   regular Resp:     16 per minute BP sitting:   112 / 62  (left arm) Cuff size:   regular  Vitals Entered By: Rock Nephew CMA (October 07, 2010 3:32 PM)  O2 Flow:  Room air CC: Patient c/o R ear pain pressure, sore throat, bodyache, URI symptoms Is Patient Diabetic? No Pain Assessment Patient in pain? no       Does patient need assistance? Functional Status Self care Ambulation Normal   Primary Care Danaisha Celli:  [name] MD  CC:  Patient c/o R ear pain pressure, sore throat, bodyache, and URI symptoms.  History of Present Illness:  URI Symptoms      This is a 70 year old woman who presents with URI symptoms.  The symptoms began 3 days ago.  The severity is described as moderate.  The patient reports sore throat, but denies nasal congestion, clear nasal discharge, purulent nasal discharge, dry cough, productive cough, earache, and sick contacts.  Associated symptoms include low-grade fever (<100.5 degrees).  The patient denies stiff neck, dyspnea, wheezing, rash, vomiting, diarrhea, use of an antipyretic, and response to antipyretic.  The patient also reports muscle aches and severe fatigue.  The patient denies itchy throat, sneezing, and headache.  Risk factors for Strep sinusitis include Strep exposure, tender adenopathy, and absence of cough.  The patient denies the following risk factors for Strep sinusitis: unilateral facial pain and unilateral nasal discharge.    Preventive Screening-Counseling & Management  Alcohol-Tobacco     Alcohol drinks/day: <1     Alcohol type: wine     >5/day in last 3 mos: no     Alcohol Counseling: not indicated; use of alcohol is not excessive  or problematic     Feels need to cut down: no     Feels annoyed by complaints: no     Feels guilty re: drinking: no     Needs 'eye opener' in am: no     Smoking Status: quit     Tobacco Counseling: to remain off tobacco products  Hep-HIV-STD-Contraception     Hepatitis Risk: no risk noted     HIV Risk: no risk noted     STD Risk: no risk noted      Sexual History:  currently monogamous.        Drug Use:  never.        Blood Transfusions:  no.    Clinical Review Panels:  Prevention   Last Mammogram:  Normal Bilateral (07/23/2010)   Last Colonoscopy:  Location:  [facility].  (08/16/2001)  Immunizations   Last Tetanus Booster:  Tdap (09/28/2010)   Last Flu Vaccine:  Fluvax MCR (08/13/2010)   Last Pneumovax:  Pneumovax (08/13/2010)  Diabetes Management   Creatinine:  0.6 (09/30/2010)   Last Flu Vaccine:  Fluvax MCR (08/13/2010)   Last Pneumovax:  Pneumovax (08/13/2010)  CBC   WBC:  4.2 (09/30/2010)   RBC:  4.24 (09/30/2010)   Hgb:  12.8 (09/30/2010)   Hct:  37.3 (09/30/2010)   Platelets:  193.0 (09/30/2010)   MCV  88.2 (09/30/2010)   MCHC  34.4 (09/30/2010)   RDW  13.6 (09/30/2010)   PMN:  55.5 (09/30/2010)   Lymphs:  31.3 (09/30/2010)   Monos:  11.2 (09/30/2010)   Eosinophils:  1.6 (09/30/2010)   Basophil:  0.4 (09/30/2010)  Complete Metabolic Panel   Glucose:  103 (09/30/2010)   Sodium:  142 (09/30/2010)   Potassium:  3.9 (09/30/2010)   Chloride:  104 (09/30/2010)   CO2:  32 (09/30/2010)   BUN:  12 (09/30/2010)   Creatinine:  0.6 (09/30/2010)   Albumin:  3.7 (09/30/2010)   Total Protein:  6.1 (09/30/2010)   Calcium:  9.3 (09/30/2010)   Total Bili:  0.6 (09/30/2010)   Alk Phos:  81 (09/30/2010)   SGPT (ALT):  20 (09/30/2010)   SGOT (AST):  23 (09/30/2010)   Medications Prior to Update: 1)  Aspirin 325 Mg Tabs (Aspirin) .... Take One By Mouth Once Daily 2)  Armour Thyroid 120 Mg Tabs (Thyroid) .Marland Kitchen.. 1 Tab Once Daily 3)  Co Q-10 120 Mg  Caps (Coenzyme Q10) .... Take One By Mouth Once Daily 4)  Fish Oil 1000 Mg Caps (Omega-3 Fatty Acids) .... Take One By Mouth Once Daily 5)  Ra Vitamin A 16109 Unit Caps (Vitamin A) .... Take One By Mouth Once Daily Prn 6)  B-12 1500 Mcg Cr-Tabs (Cyanocobalamin) .... Take One By Mouth Once Daily 7)  Vitamin C 1000 Mg Tabs (Ascorbic Acid) .... Take One By Mouth Once Daily 8)  Calmatrix 1800mg  .... Once Daily 9)  Ultra Synergist E 500iu .... Once Daily 10)  Citramins .... Take 2 Tablets Daily( Pt Out) 11)  Selenium 200 Mcg Tabs (Selenium) .... Once Daily 12)  Zinc .... Daily 13)  Vitamin D .... Daily 14)  Vitamin K .... Daily 15)  Xanax 0.25 Mg Tabs (Alprazolam) .... As Needed 16)  Protonix 40 Mg Tbec (Pantoprazole Sodium) .Marland Kitchen.. 1 Pill Every Day  Current Medications (verified): 1)  Aspirin 325 Mg Tabs (Aspirin) .... Take One By Mouth Once Daily 2)  Co Q-10 120 Mg Caps (Coenzyme Q10) .... Take One By Mouth Once Daily 3)  Fish Oil 1000 Mg Caps (Omega-3 Fatty Acids) .... Take One By Mouth Once Daily 4)  Ra Vitamin A 60454 Unit Caps (Vitamin A) .... Take One By Mouth Once Daily Prn 5)  B-12 1500 Mcg Cr-Tabs (Cyanocobalamin) .... Take One By Mouth Once Daily 6)  Vitamin C 1000 Mg Tabs (Ascorbic Acid) .... Take One By Mouth Once Daily 7)  Calmatrix 1800mg  .... Once Daily 8)  Ultra Synergist E 500iu .... Once Daily 9)  Citramins .... Take 2 Tablets Daily( Pt Out) 10)  Selenium 200 Mcg Tabs (Selenium) .... Once Daily 11)  Zinc .... Daily 12)  Vitamin D .... Daily 13)  Vitamin K .... Daily 14)  Xanax 0.25 Mg Tabs (Alprazolam) .... As Needed 15)  Protonix 40 Mg Tbec (Pantoprazole Sodium) .Marland Kitchen.. 1 Pill Every Day 16)  Armour Thyroid 90 Mg Tabs (Thyroid) .... One By Mouth Once Daily 17)  Amoxicillin 500 Mg Cap (Amoxicillin) .... Take 1 Capsule By Mouth Three Times A Day X 10 Days  Allergies (verified): No Known Drug Allergies  Past History:  Past Medical History: Last updated:  04/30/2009 Current Problems:  CHEST PAIN (ICD-786.50) COMMON MIGRAINE (ICD-346.10) HYPOTHYROIDISM (ICD-244.9) RAYNAUD'S DISEASE (ICD-443.0) HIATAL HERNIA (ICD-553.3) GERD (ICD-530.81) ESOPHAGEAL STRICTURE (ICD-530.3) IRRITABLE BOWEL SYNDROME (ICD-564.1) CONSTIPATION (ICD-564.00)  Past Surgical History: Last updated: 04/30/2009 Hysterectomy 1976 Tonsillectomy 1990  ectopic preg 1970 Rhinoplasty 1980 Foot surgery 1997  Family History: Last updated: 04/30/2009 Family History of Breast Cancer: sister No FH of Colon Cancer: Family History of Diabetes: grandmother, aunt, uncle Family History of Heart Disease: father, paternal grandparents Family History of Lung cancer: aunt  Social History: Last updated: 04/30/2009 Patient is a former smoker. quit 1985 Alcohol Use - yes 1-2 per week Illicit Drug Use - no Patient gets regular exercise. Occupation: Nature conservation officer Married with 2 children  Risk Factors: Alcohol Use: <1 (10/07/2010) >5 drinks/d w/in last 3 months: no (10/07/2010) Exercise: yes (04/30/2009)  Risk Factors: Smoking Status: quit (10/07/2010)  Family History: Reviewed history from 04/30/2009 and no changes required. Family History of Breast Cancer: sister No FH of Colon Cancer: Family History of Diabetes: grandmother, aunt, uncle Family History of Heart Disease: father, paternal grandparents Family History of Lung cancer: aunt  Social History: Reviewed history from 04/30/2009 and no changes required. Patient is a former smoker. quit 1985 Alcohol Use - yes 1-2 per week Illicit Drug Use - no Patient gets regular exercise. 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   Endo:  Denies cold  intolerance, excessive hunger, excessive thirst, excessive urination, heat intolerance, polyuria, and weight change.  Physical Exam  General:  alert, well-developed, well-nourished, well-hydrated, appropriate dress, normal appearance, healthy-appearing, cooperative to examination, and good hygiene.   Head:  normocephalic, atraumatic, no abnormalities observed, and no abnormalities palpated.   Eyes:  vision grossly intact, pupils equal, pupils round, and pupils reactive to light.   Ears:  R ear normal and L ear normal.   Nose:  External nasal examination shows no deformity or inflammation. Nasal mucosa are pink and moist without lesions or exudates. Mouth:  good dentition, no exudates, no posterior lymphoid hypertrophy, no postnasal drip, no pharyngeal crowing, no lesions, no aphthous ulcers, no erosions, no tongue abnormalities, no leukoplakia, no petechiae, and pharyngeal erythema.   Neck:  supple, full ROM, no masses, no thyromegaly, no thyroid nodules or tenderness, normal carotid upstroke, and no cervical lymphadenopathy.   Lungs:  normal respiratory effort, no intercostal retractions, no accessory muscle use, normal breath sounds, no dullness, no fremitus, no crackles, and no wheezes.   Heart:  normal rate, regular rhythm, no murmur, no gallop, no rub, and no JVD.   Abdomen:  soft, non-tender, normal bowel sounds, no distention, no masses, no guarding, no rigidity, no rebound tenderness, no abdominal hernia, no inguinal hernia, no hepatomegaly, and no splenomegaly.   Msk:  No deformity or scoliosis noted of thoracic or lumbar spine.   Pulses:  R and L carotid,radial,femoral,dorsalis pedis and posterior tibial pulses are full and equal bilaterally Extremities:  No clubbing, cyanosis, edema, or deformity noted with normal full range of motion of all joints.   Neurologic:  No cranial nerve deficits noted. Station and gait are normal. Plantar reflexes are down-going bilaterally. DTRs are  symmetrical throughout. Sensory, motor and coordinative functions appear intact. Skin:  Intact without suspicious lesions or rashes Cervical Nodes:  no anterior cervical adenopathy and no posterior cervical adenopathy.   Axillary Nodes:  no R axillary adenopathy and no L axillary adenopathy.   Psych:  Cognition and judgment appear intact. Alert and cooperative with normal attention span and concentration. No apparent delusions, illusions, hallucinations   Impression &  Recommendations:  Problem # 1:  STREP THROAT (ICD-034.0) Assessment New  Her updated medication list for this problem includes:    Aspirin 325 Mg Tabs (Aspirin) .Marland Kitchen... Take one by mouth once daily    Amoxicillin 500 Mg Cap (Amoxicillin) .Marland Kitchen... Take 1 capsule by mouth three times a day x 10 days  Problem # 2:  HYPOTHYROIDISM (ICD-244.9) Assessment: Unchanged  The following medications were removed from the medication list:    Armour Thyroid 120 Mg Tabs (Thyroid) .Marland Kitchen... 1 tab once daily Her updated medication list for this problem includes:    Armour Thyroid 90 Mg Tabs (Thyroid) ..... One by mouth once daily  Labs Reviewed: TSH: 0.01 (09/30/2010)     Complete Medication List: 1)  Aspirin 325 Mg Tabs (Aspirin) .... Take one by mouth once daily 2)  Co Q-10 120 Mg Caps (Coenzyme q10) .... Take one by mouth once daily 3)  Fish Oil 1000 Mg Caps (Omega-3 fatty acids) .... Take one by mouth once daily 4)  Ra Vitamin A 16109 Unit Caps (Vitamin a) .... Take one by mouth once daily prn 5)  B-12 1500 Mcg Cr-tabs (Cyanocobalamin) .... Take one by mouth once daily 6)  Vitamin C 1000 Mg Tabs (Ascorbic acid) .... Take one by mouth once daily 7)  Calmatrix 1800mg   .... Once daily 8)  Ultra Synergist E 500iu  .... Once daily 9)  Citramins  .... Take 2 tablets daily( pt out) 10)  Selenium 200 Mcg Tabs (Selenium) .... Once daily 11)  Zinc  .... Daily 12)  Vitamin D  .... Daily 13)  Vitamin K  .... Daily 14)  Xanax 0.25 Mg Tabs  (Alprazolam) .... As needed 15)  Protonix 40 Mg Tbec (Pantoprazole sodium) .Marland Kitchen.. 1 pill every day 16)  Armour Thyroid 90 Mg Tabs (Thyroid) .... One by mouth once daily 17)  Amoxicillin 500 Mg Cap (Amoxicillin) .... Take 1 capsule by mouth three times a day x 10 days  Patient Instructions: 1)  Please schedule a follow-up appointment in 2 months. 2)  Get plenty of rest, drink lots of clear liquids, and use Tylenol or Ibuprofen for fever and comfort. Return in 7-10 days if you're not better:sooner if you're feeling worse. 3)  Take 650-1000mg  of Tylenol every 4-6 hours as needed for relief of pain or comfort of fever AVOID taking more than 4000mg   in a 24 hour period (can cause liver damage in higher doses). 4)  Take your antibiotic as prescribed until ALL of it is gone, but stop if you develop a rash or swelling and contact our office as soon as possible. Prescriptions: AMOXICILLIN 500 MG CAP (AMOXICILLIN) Take 1 capsule by mouth three times a day X 10 days  #30 x 0   Entered and Authorized by:   [name] MD   Signed by:   [name] MD on 10/07/2010   Method used:   Print then Give to Patient   RxID:   [id number] ARMOUR THYROID 90 MG TABS (THYROID) One by mouth once daily  #30 x 11   Entered and Authorized by:   [name] MD   Signed by:   [name] MD on 10/07/2010   Method used:   Print then Give to Patient   RxID:   [id number]    Orders Added: 1)  Est. Patient Level IV [29528]    Laboratory Results    Other Tests  Rapid Strep: positive

## 2010-12-24 NOTE — Miscellaneous (Signed)
Summary: [facility] Pervisit/prep  Clinical Lists Changes  Observations: Added new observation of NKA: T (10/29/2010 9:53)

## 2010-12-24 NOTE — Miscellaneous (Signed)
  Clinical Lists Changes  Medications: Added new medication of PROTONIX 40 MG TBEC (PANTOPRAZOLE SODIUM) 1 pill every day - Signed Rx of PROTONIX 40 MG TBEC (PANTOPRAZOLE SODIUM) 1 pill every day;  #30 x 6;  Signed;  Entered by: Layne Benton, RN, BSN;  Authorized by: Sherrill Raring, MD, [facility];  Method used: Electronically to [facility], [place], [place], [place]  [address], Ph: [phone], Fax: [phone]    Prescriptions: PROTONIX 40 MG TBEC (PANTOPRAZOLE SODIUM) 1 pill every day  #30 x 6   Entered by:   Layne Benton, RN, BSN   Authorized by:   Sherrill Raring, MD, [facility]   Signed by:   Layne Benton, RN, BSN on 01/14/2010   Method used:   Electronically to        [facility]  [address] (retail)       [address]       [place], [place]  [address]       Ph: [phone]       Fax: [phone]   RxID:   [id number]

## 2010-12-24 NOTE — Letter (Signed)
Summary: EGD Instructions  [facility]  [address] [place], [place] [address]   Phone: [phone]  Fax: [phone]       [name]    [birth year]    MRN: [id number]       Procedure Day Dorna Bloom:  [facility]  11/04/10     Arrival Time:   10:30AM     Procedure Time:  11:30AM     Location of Procedure:                    _ X _ [facility] (4th Floor)  PREPARATION FOR ENDOSCOPY   On 11/04/10 THE DAY OF THE PROCEDURE:  1.   No solid foods, milk or milk products are allowed after midnight the night before your procedure.  2.   Do not drink anything colored red or purple.  Avoid juices with pulp.  No orange juice.  3.  You may drink clear liquids until 9:30AM, which is 2 hours before your procedure.                                                                                                CLEAR LIQUIDS INCLUDE: Water Jello Ice Popsicles Tea (sugar ok, no milk/cream) Powdered fruit flavored drinks Coffee (sugar ok, no milk/cream) Gatorade Juice: apple, white grape, white cranberry  Lemonade Clear bullion, consomm, broth Carbonated beverages (any kind) Strained chicken noodle soup Hard Candy   MEDICATION INSTRUCTIONS  Unless otherwise instructed, you should take regular prescription medications with a small sip of water as early as possible the morning of your procedure.        OTHER INSTRUCTIONS  You will need a responsible adult at least 69 years of age to accompany you and drive you home.   This person must remain in the waiting room during your procedure.  Wear loose fitting clothing that is easily removed.  Leave jewelry and other valuables at home.  However, you may wish to bring a book to read or an iPod/MP3 player to listen to music as you wait for your procedure to start.  Remove all body piercing jewelry and leave at home.  Total time from sign-in until discharge is approximately 2-3 hours.  You should go home  directly after your procedure and rest.  You can resume normal activities the day after your procedure.  The day of your procedure you should not:   Drive   Make legal decisions   Operate machinery   Drink alcohol   Return to work  You will receive specific instructions about eating, activities and medications before you leave.    The above instructions have been reviewed and explained to me by   Wyona Almas RN  October 29, 2010 10:32 AM     I fully understand and can verbalize these instructions _____________________________ Date _________

## 2011-04-06 NOTE — Consult Note (Signed)
NAME:  [name], BEEVERS NO.:  [id number]   MEDICAL RECORD NO.:  [id number]          PATIENT TYPE:  EMS   LOCATION:  [facility]                         FACILITY:  [facility]   PHYSICIAN:  Luis Abed, MD, FACCDATE OF BIRTH:  [birth year]   DATE OF CONSULTATION:  10/15/2007  DATE OF DISCHARGE:  10/15/2007                                 CONSULTATION   REASON FOR EVALUATION:  The patient complains of chest pain and neck  pain.   HISTORY OF PRESENT ILLNESS:  A 70 year old Caucasian female with no  known history of coronary artery disease but history of TIA on the left  in 2002, was admitted to the ER with complaints of left-sided chest pain  and left neck pain, felt as if blood is not getting to the left side of  her body.  It began yesterday in the morning with symptoms worsening  today with no associated symptoms of shortness of breath, nausea,  vomiting, dizziness, focal deficits or weakness.  The patient normally  sees a primary care physician, Dr. Allena Napoleon, who is also a  holistic physician, and when she has these episodes the patient states  that she gets a shot of magnesium and symptoms are usually alleviated.  She states that she has not been taking her magnesium like she should at  home.   The patient did have a neurologic workup by Dr. Sharene Skeans back in 2002  which did include an MRI, carotid Dopplers, and this was found to be  negative for any acute findings or findings associated with carotid  artery disease.  She was diagnosed with a migraine and has been treated  as an outpatient.  She has seen Dr. Dietrich Pates back in March 2008, and  at that time, the patient was diagnosed with GERD placed on Protonix  which seemed to help the symptoms for a short period of time.  The  patient also had a stress Myoview in 2004 which was negative for  ischemia signs of prior infarction.  The patient was last seen by  neurology and was to follow up with her primary care  physician  concerning her migraines.  She states that these symptoms are very  similar to the type of symptoms she had when she had her migraines.  She  did not feel it was related to her heart, but ER physician had requested  our evaluation.   REVIEW OF SYSTEMS:  Positive for a left-sided neck pain, facial  numbness, tingling, and weakness on the left side.  I feel like blood  is leaving the left side of my body.  The patient has no focal deficits  however.   PAST MEDICAL HISTORY:  1. Right TIA in 2002.  2. Migraine headaches.  3. Raynaud syndrome.  4. Hypothyroidism.  5. 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  6. Remote tobacco abuse.   The patient had a stress Myoview in 2004 revealing normal, no evidence  of ischemia or infarction with an EF of 80%.   SOCIAL HISTORY:  She lives in [place] with her husband.  She is retired.  She has 2 children and a grandchild.  She walks everyday.  Drinks wine  occasionally.   FAMILY HISTORY:  Father died at age 21 of CHF.  She has 2 sisters and  her mother who is alive and well.   CURRENT MEDICATIONS:  1. Aspirin 325 once a day.  2. Armour Thyroid 30 mg once a day.  3. Aciphex 20 mg once a day.  4. __________ 1800 mg once a day.  5. Coenzyme Q10 daily.  6. Fish oil one gram daily.  7. Magnesium tablets once a day.  8. Multivitamins once a day.  9. Vitamin C, vitamin B-12 and B-6 once a day.   ALLERGIES:  No known drug allergies.   CURRENT LABS:  Sodium 131, potassium 3.7, chloride 98, CO2 26, BUN 11,  creatinine 0.6, glucose 127.  Hemoglobin 13.7, hematocrit 40.5, white  blood cells 4.5, platelets 236.  AST 35, ALT 33.  Total protein 6.9,  albumin 3.8, calcium 9.0, magnesium 2.1.  Troponin less than 0.001.  EKG  revealed a normal sinus tachycardia with ventricular of 90-107 beats per  minute.   PHYSICAL EXAM:  VITAL SIGNS:  Blood pressure 117/68, heart rate 90-100  respirations 16, temperature 97.3, O2 sat 96% on room  air.  HEENT:  Head is normocephalic and atraumatic.  Eyes:  PERRLA.  Mucous  membranes and mouth pink and moist.  Tongue midline.  NECK:  Supple.  No JVD.  No carotid bruits. No thyromegaly noted.  She  does complain of neck tingling on palpation.  CARDIOVASCULAR:  Regular rate and rhythm.  Sinus tachycardia without  murmurs, rubs, or gallops.  Pulses are 2+ and equal bilaterally.  LUNGS:  Clear to auscultation without wheezes, rales, or rhonchi.  ABDOMEN:  Soft, nontender, with 2+ bowel sounds.  EXTREMITIES:  Without clubbing, cyanosis, or edema.  NEUROLOGIC:  Cranial nerves II-XII are grossly intact.  No focal  deficits; however, the patient expresses weakness on the left.   IMPRESSION:  1. Atypical chest pain.  2. History of left-sided transient ischemic attack in 2002.  3. History of hypothyroidism.   PLAN:  The patient was seen and examined by myself and Dr. Zackery Barefoot  in the emergency room.  At this time, the patient symptoms are not  cardiac etiology.  We will not suggest a cardiac workup at this time in  the hospital.  We will suggest further evaluation by the ER physician or  internal medicine or neurology.  This has been discussed with the ER  physician and the patient who verbalizes understanding.      Bettey Mare. Lyman Bishop, NP      Luis Abed, MD, [facility]  Electronically Signed    KML/MEDQ  D:  10/15/2007  T:  10/15/2007  Job:  [id number]   cc:   Allena Napoleon

## 2011-04-06 NOTE — Assessment & Plan Note (Signed)
[facility]                            CARDIOLOGY OFFICE NOTE   NAME:WILLIAMSLataisha, Colan                     MRN:          [id number]  DATE:11/08/2007                            DOB:          January 15, 1941    This is a patient of Dr. Dietrich Pates.  This is a 69 year old white female  patient who has known history of coronary artery disease, but was seen  in the emergency room by Dr. Myrtis Ser on October 15, 2007.  She has a  history of TIA in 2002 and a negative stress Myoview in 2004.  She  presented to the emergency room with left-sided chest and neck pain, and  felt as if her blood was not getting to the left side of her body.  She  typically is treated with shots of magnesium by Dr. Alessandra Bevels, but has not  been taking her magnesium like she should.  Dr. Myrtis Ser saw her with  Metta Clines, nurse practitioner, and did not feel her symptoms  were cardiac in origin, and suggested further workup by internal  medicine or neurology.  She did have a negative head CT, and blood work  was within normal limits, including negative CK-MB and troponin.  Today  the patient says that she still occasionally has sharp, shooting chest  pain.  She says they happen at any time.  They are short lived.  She has  no associated tightness, shortness of breath, dizziness, palpitations,  or presyncope.  She said she can be sitting down reading, and she will  just have a sharp shooting pain through the left side of her chest that  goes away as quickly as it starts.  She walks 2 miles without difficulty  and cares for her great granddaughter without any problems.  She does  not feel like she strained herself in any way.  She does have acid  reflux, but this is not necessarily associated when she has her reflux  symptoms.   CURRENT MEDICATIONS:  1. Aspirin 325 mg.  2. Armour thyroid 30 mg daily.  3. Aciphex 20 mg daily.  4. Cal Matrix 1800 mg daily.  5. Co-Q10 120 mg daily.  6. Fish  oil 1000 mg daily.  7. Magnesium glycinate 400 mg daily.  8. Ultra Nutrient 6 daily.  9. Vitamin A 10,000 international units daily.  10.Vitamin B12 1500 mg daily.  11.Vitamin C 1000 mg daily.  12.Vitamin D 2 drops daily.  13.Zinc 40 mg daily.   Cardiac risk factors are negative for hypertension, diabetes, negative  for hyperlipidemia.  Her father died at 35 of an MI, but had no cardiac  problems up until that point, and she is a nonsmoker.   PHYSICAL EXAMINATION:  This is a pleasant but anxious 70 year old white  female in no acute distress.  Blood pressure 118/76, pulse 67, weight 132.  NECK:  Without JVD, HJR, bruit or thyroid enlargement.  LUNGS:  Clear anterior, posterior and lateral.  HEART:  Regular rate and rhythm at 67 beats per minute.  Normal S1 and  S2, no murmur, rub, bruit, thrill, or heave  noted.  ABDOMEN:  Soft without organomegaly, masses, lesions, or abnormal  tenderness.  EXTREMITIES:  Without cyanosis, clubbing, or edema.  She had good distal  pulses.   EKG normal sinus rhythm, poor R-wave progression.  No acute change.   IMPRESSION:  1. Chest pains, somewhat atypical.  2. History of TIA in 2002.  3. History of migraine headaches.  4. Raynaud's syndrome.  5. Hyperthyroidism.  6. 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  7. Remote tobacco abuse.  8. Father died at 72 of an myocardial infarction.   PLAN:  At this time, to rule out ischemia, we will order a stress  Cardiolite as well as to give her some peace of mind.  If this is  normal, I told her she could follow up with her medical doctor, and will  not need further cardiac workup at this time.  If there is any  abnormality, she needs to see Dietrich Pates back in followup.      Jacolyn Reedy, PA-C  Electronically Signed      Noralyn Pick. Eden Emms, MD, [facility]  Electronically Signed   ML/MedQ  DD: 11/08/2007  DT: 11/08/2007  Job #: [id number]

## 2011-04-09 NOTE — Assessment & Plan Note (Signed)
[facility]                            CARDIOLOGY OFFICE NOTE   NAME:[name], [name]                     MRN:          [id number]  DATE:01/23/2007                            DOB:          [birth year]    [name] Little is a woman who is 70.  I actually saw her in the past back  in 2005.  No known history of coronary artery disease.  Has had chest  pain in the past.  In 2004, I saw her and Myoview scan was negative.  In  2005, her spell I was convinced was more musculoskeletal.   The patient comes in today for continued episodes of chest pain.  She  says that since January she has noted episodes a couple of times per  week that occur with and without activity.  It can be either stabbing, a  pressure or a heaviness.  They can last minutes to the whole day.  Again, no exacerbating or alleviating thing.   Since January, she also notes that she is preparing taxes and she is  busier with this.  Prior to this, she was walking and doing Pilates  without a problem.   She denies chest pressure with activity.  No shortness of breath with  activity.   CURRENT MEDICATIONS:  1. Include aspirin 325.  2. Armour Thyroid 30.  3. AcipHex 20.  4. Kalmex 1800.  5. Coenzyme Q10 120 daily.  6. Fish oil 1 gram daily.  7. Magnesium gluconate 400 daily.  8. Ultra Nutrient 6 times x3 days.  9. Vitamin A, vitamin B12, vitamin C, vitamin D, vitamin E and zinc.   PHYSICAL EXAMINATION:  The patient is in no distress.  Blood pressure  117/73, pulse is 92, weight 129.  LUNGS:  Clear.  CARDIAC EXAM:  Regular rate and rhythm.  S1-S2.  No S3, no murmurs.  ABDOMEN:  Benign.  CHEST:  Nontender.  EXTREMITIES:  Good pulses.  No edema.   EKG normal sinus rhythm, 75 beats per minute.  A 12-lead EKG shows  cholesterol of 507, LDL was 92, HDL was 56.   IMPRESSION:  The patient is a 70 year old I have seen in the past.  Family history is not strong for coronary disease.  She has  excellent  lipid control.  Her episodes of chest pain are very atypical for  cardiac.  She does have a history of reflux and actually esophageal  strictures.  She denies problems swallowing her pills.  Still, I would  recommend doubling up on her AcipHex.  I have also given her samples of  Protonix.  If her symptoms do not resolve in the next 10 days,  then I have scheduled for a stress test.  Otherwise, take activities as  tolerated.  She will call for follow-up and follow up p.r.n.     Pricilla Riffle, MD, [facility]  Electronically Signed    PVR/MedQ  DD: 01/23/2007  DT: 01/24/2007  Job #: [id number]   cc:   Allena Napoleon

## 2011-04-09 NOTE — Discharge Summary (Signed)
[place]  Patient:    [name], [name]                     MRN: [id number] Adm. Date:  [id number] Disc. Date: [id number] Attending:  Mick Sell                           Discharge Summary  DATE OF BIRTH:  1940/11/23  ADMISSION DIAGNOSES: 1. Right brain transient ischemic attack. 2. History of migraine.  DISCHARGE DIAGNOSES: 1. Episode of orthostasis with hypotension. 2. Right brain transient ischemic attack versus tiny subcortical infarct. 3. Transient left-sided numbness secondary to #2. 4. History of migraine.  CONDITION AT DISCHARGE:  Good.  DIET AT DISCHARGE:  Low salt/low fat/low cholesterol.  ACTIVITY:  Ad lib.  MEDICATIONS AT DISCHARGE: 1. Enteric-coated aspirin 325 mg p.o. q.d. 2. Multivitamin q.d. 3. Aleve and Tylenol No. 3 as needed.  STUDIES: 1. 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 2. 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 3. MRA of the brain revealing focal stenosis of left MCA branch at the    trifurcation. 4. Carotid Doppler study performed Apr 17, 2001 which revealed no significant    carotid artery stenosis. 5. Echocardiogram performed Apr 17, 2001 revealing normal LV systolic function    and no ventricular wall motion abnormalities. 6. EKG performed Apr 17, 2001 revealing nonspecific T wave abnormality.  HOSPITAL COURSE:  Please see admission H&P for full admission details. Briefly, this is a 70 year old woman with a history of migraine who was admitted for numbness involving the left face, arm, and leg occurring without headache.  She had no known risk factors for stroke.  She was admitted for a presumed stroke event, underwent the usual stroke workup with the above results.  By the next morning her numbness had essentially  resolved and was minimal at discharge with only some slight residual numbness in the face.  She was placed on heparin and when the results of her workup were complete she was stepped down to aspirin as this was deemed her appropriate stroke prophylaxis. On Apr 17, 2001 she had a brief episode of hypotension with orthostasis.  This resolved with recumbent positioning and IV fluids and she had no further problems with this throughout her hospitalization.  She was monitored on telemetry and this revealed no events.  Patient was deemed stable and was discharged to home on Apr 18, 2001.  FOLLOW-UP:  She will follow up with Dr. Orlin Hilding, her primary neurologist, as needed for her migraines and will pursue evaluation by primary care physician. D:  04/18/01 TD:  04/19/01 Job: [id number]

## 2011-04-09 NOTE — Consult Note (Signed)
[place]  Patient:    KAMIYA, ACORD                     MRN: [id number] Proc. Date: 04/16/01 Adm. Date:  [id number] Attending:  Mick Sell CC:         Gustavus Messing. Orlin Hilding, M.D.   Consultation Report  CHIEF COMPLAINT:  Numb left face, arm, and leg.  HISTORY OF PRESENT ILLNESS:  [name] is a 70 year old, right-handed, married, Caucasian woman who had onset of numbness and paresthesias in her left arm and leg when she came out of the shower around 11:00 this morning. The symptoms passed in 30 minutes and she did not think further of it.  She got ready to take her granddaughter to [place] (she lives in [place]) and she had the onset of a Novocain-like numbness in her left face on the inside and the outside, in the VII and VIII, at around 1 p.m. which has gradually improved.  She presented at [facility] at 15:54 hours.  A call was placed to me at 17:24 hours.  Her symptoms have since subsided.  PAST MEDICAL HISTORY: 1. Positive for migraine headaches without aura about two times a week since    young adulthood. 2. Fibrocystic breast disease. 3. Heavy menstrual periods. 4. Bunions.  REVIEW OF SYSTEMS:  The patient denied current headache, although she had one yesterday.  She had been out working in the head.  She has not had any other signs or symptoms.  No closed head injuries or nervous system infections.  No general infections in head, neck, lung, GI, or GU.  No bleeding dyscrasias. She has had palpitations, but no other cardiovascular complaints.  Review of systems in the 12 major systems is otherwise negative.  PAST SURGICAL HISTORY: 1. Hysterectomy. 2. Aspiration of cysts in her breasts. 3. Tonsillectomy and adenoidectomy. 4. Bunionectomies x 2.  MEDICATIONS: 1. Biotin 4000 mg to 5000 mg per day. 2. Centrum Silver 1 per day. 3. Tylenol #3 plus 600 mg ibuprofen as needed for migraines. 4.  Phenergan, rarely needed for nausea and vomiting.  ALLERGIES:  None known.  FAMILY HISTORY:  Mother is 45 years of age as of yesterday.  She is alive and well.  Father died at age 24 of complications of myocardial infarction.  The patients sister has breast cancer.  The other sister has fibrocystic breast disease.  Her children and grandchildren are healthy.  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  She has a high school degree and had one to two years of college.  She quit smoking tobacco 15 years ago.  She drinks occasional alcohol.  PHYSICAL EXAMINATION:  GENERAL:  On examination today, this is a very pleasant woman lying on the stretcher in no distress.  She is right-handed.  VITAL SIGNS:  Temperature 97, blood pressure 138/68, resting pulse 76, respirations 16, pulse oximetry 99% on room air.  ENT:  No signs of infection.  No bruits.  NECK:  Supple.  LUNGS:  Clear.  HEART:  No murmurs.  Pulse is normal.  ABDOMEN:  Soft.  Bowel sounds normal.  No hepatosplenomegaly.  EXTREMITIES:  Normal.  SKIN:  Normal.  NEUROLOGICAL:  Awake, alert.  No dysphasia or dyspraxia.  CRANIAL NERVE EXAMINATION:  Round reactive pupils.  Normal fundi.  Visual fields full to double simultaneous stimuli.  OKN responses equal bilaterally. Symmetric facial  strength and sensation.  Midline tongue and uvula.  Air conduction greater than bone conduction bilaterally.  MOTOR EXAMINATION:  Normal strength, tone, and mass.  Good fine motor movements.  No pronator drift.  Sensation intact to primary and cortical modalities.  CEREBELLAR EXAMINATION:  Good finger-to-nose and rapid repetitive movement. Gait and station normal.  Deep tendon reflexes are normal.  The patient had bilateral flexor plantar responses.  IMPRESSION:  Transient ischemic attack, right brain.  I have reviewed the CT scan personally, both November 2001,  and the one carried out today.  It shows a remote left frontal subcortical white matter lesion that measures under 0.5 cm in diameter.  This was present in both scans.  The ventricles and other gray and white matter structures of the brain appear entirely normal.  PLAN:  Admit with IV heparin.  MRI/MRA intracranial and extracranial, 2-D echocardiogram, and workup for other causes including fasting lipid profile, serum homocystine, and glycohemoglobin.  We will institute secondary stroke prevention as indicated.  We will likely place this patient on antiplatelet therapy.  There is no need for a therapist because her symptoms have subsided.  I suspect that this is a subcortical right brain process, possibly more posterior in the posterior limit of the internal capsule of her thalamus.  I appreciate the opportunity to see Ms. Carneal. DD:  04/16/01 TD:  04/17/01 Job: [id number]

## 2011-04-13 ENCOUNTER — Encounter: Payer: Self-pay | Admitting: Internal Medicine

## 2011-04-13 DIAGNOSIS — Z0000 Encounter for general adult medical examination without abnormal findings: Secondary | ICD-10-CM | POA: Insufficient documentation

## 2011-04-15 ENCOUNTER — Ambulatory Visit (INDEPENDENT_AMBULATORY_CARE_PROVIDER_SITE_OTHER): Payer: Medicare Other | Admitting: Internal Medicine

## 2011-04-15 ENCOUNTER — Telehealth: Payer: Self-pay | Admitting: Internal Medicine

## 2011-04-15 ENCOUNTER — Other Ambulatory Visit (INDEPENDENT_AMBULATORY_CARE_PROVIDER_SITE_OTHER): Payer: Medicare Other

## 2011-04-15 ENCOUNTER — Encounter: Payer: Self-pay | Admitting: Internal Medicine

## 2011-04-15 ENCOUNTER — Ambulatory Visit (INDEPENDENT_AMBULATORY_CARE_PROVIDER_SITE_OTHER)
Admission: RE | Admit: 2011-04-15 | Discharge: 2011-04-15 | Disposition: A | Discharge status: Home / Self Care | Payer: Medicare Other | Source: Ambulatory Visit | Attending: Internal Medicine | Admitting: Internal Medicine

## 2011-04-15 DIAGNOSIS — R079 Chest pain, unspecified: Secondary | ICD-10-CM | POA: Insufficient documentation

## 2011-04-15 DIAGNOSIS — M549 Dorsalgia, unspecified: Secondary | ICD-10-CM

## 2011-04-15 DIAGNOSIS — E039 Hypothyroidism, unspecified: Secondary | ICD-10-CM

## 2011-04-15 DIAGNOSIS — F410 Panic disorder [episodic paroxysmal anxiety] without agoraphobia: Secondary | ICD-10-CM

## 2011-04-15 LAB — TSH: TSH: 0.01 u[IU]/mL — ABNORMAL LOW (ref 0.35–5.50)

## 2011-04-15 MED ORDER — THYROID 30 MG PO TABS: 30.0000 mg | ORAL_TABLET | Freq: Every day | ORAL | Status: DC

## 2011-04-15 NOTE — Telephone Encounter (Signed)
Please call pt - TSH still near zero  (meaning on too much thyroid med)      1) stop the armour thyroid 90     2)   Start the armour thryoid 30     3)   Return for LAB only  In 4 wks:   TSH  244.8

## 2011-04-15 NOTE — Assessment & Plan Note (Signed)
For t-spine films as discussed for CP

## 2011-04-15 NOTE — Patient Instructions (Signed)
Continue all other medications as before You can take ibuprofen OTC prn pain for now Please go to XRAY in the Basement for the x-ray test Please go to LAB in the Basement for the blood and/or urine tests to be done today - the thyroid test Please call the phone number [phone] (the PhoneTree System) for results of testing in 2-3 days;  When calling, simply dial the number, and when prompted enter the MRN number above (the Medical Record Number) and the # key, then the message should start.

## 2011-04-15 NOTE — Assessment & Plan Note (Signed)
I suspect MSK strain, but cant r/o neuritic pain possibly even due to spine fx with lifting the window, has known osteopenia , and pain in radicular level approx t5 - will check cxr, left rib films and t-spine films;  If neg most likely MSK vs neuritic, and I suspect will cont to improve in the next few wks further; pt educated and reassured

## 2011-04-15 NOTE — Assessment & Plan Note (Signed)
onvercontrolled with reduction in thyroid med nov 2011, due for f/u TSH - will order

## 2011-04-15 NOTE — Progress Notes (Signed)
Quick Note:  Voice message left on PhoneTree system - lab is negative, normal or otherwise stable, pt to continue same tx ______ 

## 2011-04-15 NOTE — Assessment & Plan Note (Signed)
Ongoing, pt declines specific tx at this time such as ssri trial

## 2011-04-15 NOTE — Progress Notes (Signed)
Subjective:    Patient ID: [name], female    DOB: 1940/12/21, 70 y.o.   MRN: [id number]  HPI  Here to f/u ; pt of Dr Yetta Barre who is not available today; states was cleaing the window about 2 wks ago, and lifted it, felt a "pop" to the left side near the breast, unfort pain has persisted, severe to start, milder now, not worse to breath but worse to turning over in bed, with some soreness diffusely to the left back and chest as well.  No ST, cough, fever, wheezing, sob.  No radiation, palp's, n/v , diaphoresis, nonexertional. Pt mostly concerned b/c pain is persisted though milder.  Denies worsening depressive symptoms, suicidal ideation, and tends to panic attack occasionally, but not more than usual lately.  Takes prn xanax and does not want more than that.  Has not yet had a f/u TSH since thyroid med reduced nov 2011.   Past Medical History  Diagnosis Date  . CHEST PAIN 04/30/2009  . COMMON MIGRAINE 04/30/2009  . CONSTIPATION 04/30/2009  . ESOPHAGEAL STRICTURE 04/30/2009  . GERD 04/30/2009  . HIATAL HERNIA 04/30/2009  . HYPOTHYROIDISM 04/30/2009  . Irritable bowel syndrome 04/30/2009  . RAYNAUD'S DISEASE 04/30/2009   Past Surgical History  Procedure Date  . Abdominal hysterectomy 1976  . Tonsillectomy 1990  . Ectopic pregnancy surgery 1970  . Rhinoplasty 1980  . Foot surgury 1997    reports that she has quit smoking. She does not have any smokeless tobacco history on file. She reports that she drinks alcohol. She reports that she does not use illicit drugs. family history includes Cancer in her paternal aunt and sister; Diabetes in her maternal aunt, maternal grandmother, and maternal uncle; and Heart disease in her father, paternal grandfather, and paternal grandmother. No Known Allergies Current Outpatient Prescriptions on File Prior to Visit  Medication Sig Dispense Refill  . ALPRAZolam (XANAX) 0.25 MG tablet Take 0.25 mg by mouth as needed.        . Ascorbic Acid (VITAMIN C) 1000 MG tablet  Take 1,000 mg by mouth daily.        Marland Kitchen aspirin 325 MG tablet Take 325 mg by mouth daily.        . Coenzyme Q10 (CO Q 10) 10 MG CAPS Take by mouth daily.        . Cyanocobalamin (VITAMIN B-12 CR) 1500 MCG TBCR Take by mouth daily.        . Omega-3 Fatty Acids (FISH OIL) 1000 MG CAPS Take by mouth daily.        . pantoprazole (PROTONIX) 40 MG tablet Take 40 mg by mouth daily.        Marland Kitchen thyroid (ARMOUR) 90 MG tablet Take 90 mg by mouth daily.        . vitamin A (RA VITAMIN A) 10000 UNIT capsule Take 10,000 Units by mouth daily.        Marland Kitchen VITAMIN D, CHOLECALCIFEROL, PO Take by mouth daily.        Marland Kitchen DISCONTD: Selenium 200 MCG CAPS Take by mouth daily.        Marland Kitchen DISCONTD: VITAMIN K PO Take by mouth daily.         Review of Systems All otherwise neg per pt     Objective:   Physical Exam BP 122/82  Pulse 93  Temp(Src) 98.3 F (36.8 C) (Oral)  Ht 5' (1.524 m)  Wt 123 lb 2 oz (55.849 kg)  BMI 24.05 kg/m2  SpO2 99% Physical Exam  VS noted,  Constitutional: Pt appears well-developed and well-nourished.  HENT: Head: Normocephalic.  Right Ear: External ear normal.  Left Ear: External ear normal.  Eyes: Conjunctivae and EOM are normal. Pupils are equal, round, and reactive to light.  Neck: Normal range of motion. Neck supple.  Cardiovascular: Normal rate and regular rhythm.   Pulmonary/Chest: Effort normal and breath sounds normal.  No rash or palpable tender to left lateral chest Abd:  Soft, NT, non-distended, + BS Neurological: Pt is alert. No cranial nerve deficit. motor/sens/dtr's intact Skin: Skin is warm. No erythema.  Psychiatric: Pt behavior is normal. Thought content normal. 2-3+ nervous Has some left back paraspinal tender about t5 level, no swelling, rash       Assessment & Plan:

## 2011-04-16 NOTE — Telephone Encounter (Signed)
Pt advised, Rx has already pick up reduced Rx and also been scheduled for repeat TSH. Pt has been advised of Xray results as well

## 2011-04-26 ENCOUNTER — Emergency Department (HOSPITAL_COMMUNITY): Payer: Medicare Other

## 2011-04-26 ENCOUNTER — Inpatient Hospital Stay (HOSPITAL_COMMUNITY)
Admission: EM | Admit: 2011-04-26 | Discharge: 2011-04-27 | DRG: 313 | Disposition: A | Discharge status: Home / Self Care | Payer: Medicare Other | Attending: Cardiology | Admitting: Cardiology

## 2011-04-26 DIAGNOSIS — Z7982 Long term (current) use of aspirin: Secondary | ICD-10-CM

## 2011-04-26 DIAGNOSIS — Z79899 Other long term (current) drug therapy: Secondary | ICD-10-CM

## 2011-04-26 DIAGNOSIS — R0789 Other chest pain: Principal | ICD-10-CM | POA: Diagnosis present

## 2011-04-26 DIAGNOSIS — E039 Hypothyroidism, unspecified: Secondary | ICD-10-CM | POA: Diagnosis present

## 2011-04-26 DIAGNOSIS — K449 Diaphragmatic hernia without obstruction or gangrene: Secondary | ICD-10-CM | POA: Diagnosis present

## 2011-04-26 DIAGNOSIS — Z8673 Personal history of transient ischemic attack (TIA), and cerebral infarction without residual deficits: Secondary | ICD-10-CM

## 2011-04-26 DIAGNOSIS — K219 Gastro-esophageal reflux disease without esophagitis: Secondary | ICD-10-CM | POA: Diagnosis present

## 2011-04-26 DIAGNOSIS — R079 Chest pain, unspecified: Secondary | ICD-10-CM

## 2011-04-26 LAB — CBC
Hemoglobin: 14.9 g/dL (ref 12.0–15.0)
MCH: 30 pg (ref 26.0–34.0)
MCV: 87.5 fL (ref 78.0–100.0)
RBC: 4.96 MIL/uL (ref 3.87–5.11)
WBC: 7.2 10*3/uL (ref 4.0–10.5)

## 2011-04-26 LAB — CARDIAC PANEL(CRET KIN+CKTOT+MB+TROPI)
CK, MB: 1.1 ng/mL (ref 0.3–4.0)
Troponin I: <0.3 ng/mL (ref <0.30)

## 2011-04-26 LAB — DIFFERENTIAL
Basophils Relative: 0 % (ref 0–1)
Lymphs Abs: 0.5 10*3/uL — ABNORMAL LOW (ref 0.7–4.0)
Monocytes Relative: 9 % (ref 3–12)
Neutro Abs: 6 10*3/uL (ref 1.7–7.7)
Neutrophils Relative %: 83 % — ABNORMAL HIGH (ref 43–77)

## 2011-04-26 LAB — BASIC METABOLIC PANEL
CO2: 27 mEq/L (ref 19–32)
Chloride: 100 mEq/L (ref 96–112)
Creatinine, Ser: 0.55 mg/dL (ref 0.4–1.2)
GFR calc Af Amer: >60 mL/min (ref >60)
Potassium: 3.7 mEq/L (ref 3.5–5.1)

## 2011-04-26 LAB — TROPONIN I: Troponin I: <0.3 ng/mL (ref <0.30)

## 2011-04-26 LAB — CK TOTAL AND CKMB (NOT AT ARMC): Total CK: 35 U/L (ref 7–177)

## 2011-04-26 LAB — D-DIMER, QUANTITATIVE: D-Dimer, Quant: 0.61 ug/mL-FEU — ABNORMAL HIGH (ref 0.00–0.48)

## 2011-04-27 ENCOUNTER — Inpatient Hospital Stay (HOSPITAL_COMMUNITY): Payer: Medicare Other

## 2011-04-27 DIAGNOSIS — R079 Chest pain, unspecified: Secondary | ICD-10-CM

## 2011-04-27 LAB — CBC
HCT: 41.2 % (ref 36.0–46.0)
Hemoglobin: 14.1 g/dL (ref 12.0–15.0)
MCH: 29.7 pg (ref 26.0–34.0)
MCV: 86.9 fL (ref 78.0–100.0)
Platelets: 201 10*3/uL (ref 150–400)
RBC: 4.74 MIL/uL (ref 3.87–5.11)

## 2011-04-27 LAB — BASIC METABOLIC PANEL
Chloride: 101 mEq/L (ref 96–112)
GFR calc Af Amer: >60 mL/min (ref >60)
GFR calc non Af Amer: >60 mL/min (ref >60)
Potassium: 4.3 mEq/L (ref 3.5–5.1)
Sodium: 140 mEq/L (ref 135–145)

## 2011-04-27 LAB — CARDIAC PANEL(CRET KIN+CKTOT+MB+TROPI)
CK, MB: 1.2 ng/mL (ref 0.3–4.0)
Total CK: 35 U/L (ref 7–177)

## 2011-04-27 MED ORDER — TECHNETIUM TC 99M TETROFOSMIN IV KIT
10.0000 | PACK | Freq: Once | INTRAVENOUS | Status: AC | PRN
  Administered 2011-04-27: 10 via INTRAVENOUS

## 2011-04-28 ENCOUNTER — Other Ambulatory Visit (HOSPITAL_COMMUNITY): Payer: Medicare Other

## 2011-04-29 ENCOUNTER — Ambulatory Visit: Payer: Self-pay | Admitting: Internal Medicine

## 2011-05-06 NOTE — Discharge Summary (Signed)
[name]              ACCOUNT NO.:  [id number]  MEDICAL RECORD NO.:  [id number]  LOCATION:  3742                         FACILITY:  [facility]  PHYSICIAN:  [name], [name], [name]:  02/18/1941  DATE OF ADMISSION:  04/26/2011 DATE OF DISCHARGE:  04/27/2011                              DISCHARGE SUMMARY   PRIMARY CARDIOLOGIST:  [name], [name], [name]  PRIMARY CARE PHYSICIAN:  [name], [name]  DISCHARGE DIAGNOSIS:  Chest pain without objective evidence of ischemia.  SECONDARY DIAGNOSES: 1. History of atypical chest pain. 2. Gastroesophageal reflux disease. 3. Hiatal hernia. 4. History of esophageal stricture/Schatzki rings status post     dilatation, December 2011. 5. Hypothyroidism. 6. History of remote migraine headaches. 7. History of transient ischemic attack in 2002. 8. Status post hysterectomy. 9. Status post tonsillectomy. 10.History of ectopic pregnancy. 11.Status post rhinoplasty. 12.Status post foot surgery.  ALLERGIES:  No known drug allergies.  PROCEDURES:  Stress only exercise Myoview.  The patient walked for 11 minutes and 13 seconds achieving a maximal heart rate of 173 beats per minute.  There were no acute ST-T changes and stress imaging was normal. EF was overestimated due to the small cavity at 101%.  HISTORY OF PRESENT ILLNESS:  This is a 70 year old female with prior history of atypical chest pain status post negative Myoviews in the past who was in her usual state of health until approximately 2 days prior to admission when she began to experience sensation of chest, neck, and left arm discomfort which persisted for a period of about 48 hours.  On the day of admission, she noted that her heart rate was slightly elevated in the low 100s and she also had a low-grade fever of 99.3 without other localizing symptoms.  She presented to [name] where ECG was within normal limits and enzymes were negative.  She was admitted  for further evaluation.  Little COURSE:  The patient ruled out for MI.  This morning, she underwent stress only exercise Myoview with results as outlined above. The patient showed excellent exercise tolerance walking for 11 minutes and 13 seconds and stress imaging was normal.  The patient had no recurrence of symptoms and will be discharged home in good condition.  DISCHARGE LABORATORY DATA:  Hemoglobin 14.1, hematocrit 41.2, WBC 5.4, and platelets 201.  Sodium 140, potassium 4.3, chloride 101, CO2 29, BUN 9, creatinine 0.64, glucose 92, and calcium 8.9.  CK 35, MB 1.2, and troponin-I less than 0.30.  TSH 0.009 and free T4 0.94.  DISPOSITION:  The patient will be discharged home today in good condition.  FOLLOWUP PLANS AND APPOINTMENTS:  The patient is asked to follow up with her care provider for continued management of thyroid issues.  She will follow with [name] on May 24, 2011 at 2:00 p.m.  DISCHARGE MEDICATIONS: 1. Magnesium over-the-counter 4 tablets daily. 2. Omega Red Krill Oil over-the-counter 1 tablet daily. 3. Alprazolam 0.25 mg t.i.d. p.r.n. 4. Aspirin 81 mg daily. 5. Protonix 40 mg daily. 6. Calcium 600 mg daily. 7. Armour Thyroid 30 mg daily. 8. Coenzyme Q-10 one tablet daily. 9. Vitamin B12 5000 mcg 4 tablets daily. 10.Vitamin  D 5000 units daily.  OUTSTANDING LABORATORY STUDIES:  None.  DURATION OF DISCHARGE ENCOUNTER:  60 minutes including physician time.     [name], [name]   ______________________________ [name], [name], [name]    CB/MEDQ  D:  04/27/2011  T:  04/28/2011  Job:  [id number]  cc:   [name], [name]  Electronically Signed by [name] [name] on 05/05/2011 04:23:31 PM Electronically Signed by [name] [name] [name] on 05/06/2011 04:44:50 PM

## 2011-05-06 NOTE — H&P (Addendum)
NAMEMarland Little  JEFF, MCCALLUM NO.:  [id number]  MEDICAL RECORD NO.:  [id number]  LOCATION:  3742                         FACILITY:  [facility]  PHYSICIAN:  Rollene Rotunda, MD, FACCDATE OF BIRTH:  Jan 15, 1941  DATE OF ADMISSION:  04/26/2011 DATE OF DISCHARGE:                             HISTORY & PHYSICAL   PRIMARY CARDIOLOGIST:  Pricilla Riffle, MD, [facility]  CHIEF COMPLAINT:  Chest pain.  HISTORY OF PRESENT ILLNESS:  [name] is a 70 year old female with no prior cardiac history, was then followed by Dr. Tenny Craw in the past for atypical chest pain with most recent negative stress test in December 2008.  She also has hypothyroidism with recent dose adjustments of her Armour Thyroid downward, as well as GERD.  She has had a sensation of chest discomfort since Saturday morning, which got progressively worse. She describes it as full feeling/pain in her chest and neck.  She just does not feel right.  Yesterday, she had some diarrhea and indigestion. She denies any shortness of breath, nausea, vomiting, or any exertional symptoms.  Nothing makes it worse.  It is relieved by relaxing. Currently, she feels somewhat better, but still has some neck discomfort and does not quite feel like herself.  Today, her heart rate has been prior than usual, low 100s and she has a mildly elevated temperature at 99.3.  Otherwise, no overt fever or chills at home, cough, or dysuria. Cardiac enzymes negative x1.  Her white blood cell count is normal, but she did have increased neutrophils.  EKG essentially unchanged from February 2011.  PAST MEDICAL HISTORY: 1. Atypical chest pain, was negative nuclear study is March 2004 and     December 2008, per Dr. Tenny Craw' office note. 2. GERD/hiatal hernia. 3. Esophageal stricture/Schatzki ring, status post dilation in     December 2011. 4. Hypothyroidism. 5. Migraines remotely, since resolved. 6. TIA in 2002.  PAST SURGICAL HISTORY:  Hysterectomy,  tonsillectomy, ectopic pregnancy, rhinoplasty, and foot surgery.  MEDICATIONS: 1. Aspirin 325 mg daily. 2. Protonix unsure of dose. 3. Armour Thyroid 30 mg daily, which is a recent contact. 4. She also takes various vitamins including magnesium, CoQ10, fish     oil, B12, vitamin D and calcium  ALLERGIES:  No known drug allergies.  SOCIAL HISTORY:  [name] is married and has 2 children.  She quit smoking in 1985, and drinks occasional wine.  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  FAMILY HISTORY:  Her mother is still alive at age 71.  Father died at age 64 of an MI.  REVIEW OF SYSTEMS:  See HPI for pertinent positives.  All other systems reviewed and otherwise negative except for those noted above.  LABORATORY DATA:  WBC 7.2, hemoglobin 14.9, hematocrit 43.4, platelet count 197.  Sodium 136, potassium 3.2, chloride 100, CO2 27, glucose 104, BUN 10, creatinine 0.55.  Cardiac enzymes negative x1.  EKG, normal sinus rhythm, T-wave inversion aVL, her Q waves of 132 similar appearance of February 2011.  RADIOLOGY:  Chest x-ray showed no acute cardiopulmonary process.  PHYSICAL EXAMINATION:  VITAL SIGNS:  Temperature  99.6, pulse 93, respiratory rate 17, blood  pressure 126/64, pulse ox 97% on room air. GENERAL:  This is a pleasant well-appearing white female in no acute distress. HEENT:  Normocephalic, atraumatic with extraocular movements intact. Clear sclerae.  Nares are without discharge. NECK:  Supple without carotid bruits. HEART:  Auscultation of the heart reveals tachycardiac with regular rate and rhythm without murmurs, rubs, or gallops. LUNGS:  Clear to auscultation bilaterally without wheezes, rales, or rhonchi. ABDOMEN:  Soft, nontender, and nondistended.  Positive bowel sounds. EXTREMITIES:  Warm and dry without edema.  She has 2+ pedal pulses bilaterally. NEUROLOGIC:  She is alert and oriented x3 and  responds to questions appropriately with a normal affect.  ASSESSMENT/PLAN:  The patient was seen and examined by Dr. Antoine Poche and myself.  This is a 70 year old female with no prior known history of coronary artery disease, negative stress test in December 2008, hypothyroidism with recent dose adjustments, as well as gastroesophageal reflux disease and esophageal stricture who presents with complaints of chest pain.  She now presents with ongoing pain on left breast, dull, no exertional component.  It is not associated with shortness of breath. It was possibly improved with nitroglycerin.  There were no acute EKG changes and cardiac enzymes were negative x1.  At this time, this is chest pain is felt more atypical than typical.  There is no objective evidence of ischemia.  At this time, we will continue her home aspirin, and arrange her inpatient nuclear stress test.  Tomorrow her enzymes should remain negative.  She does have a mildly elevated D-dimer and sinus tachycardia, but no complaints of shortness of breath and her oxygen saturation are fine. In the setting of the low-grade fever, this may reflect an underlying viral etiology versus residual customization to her new thyroid dose. Dr. Antoine Poche does not feel any a further workup is needed at this time. The patient was informed with the plan and is in agreement.     Ronie Spies, P.A.C.   ______________________________ Rollene Rotunda, MD, [facility]    DD/MEDQ  D:  04/26/2011  T:  04/27/2011  Job:  [id number]  cc:   Pricilla Riffle, MD, [facility] Dr. Jonny Ruiz  Electronically Signed by Ronie Spies  on 05/06/2011 09:25:51 AM Electronically Signed by Rollene Rotunda MD [facility] on 05/12/2011 11:45:12 AM

## 2011-05-13 ENCOUNTER — Other Ambulatory Visit (INDEPENDENT_AMBULATORY_CARE_PROVIDER_SITE_OTHER): Payer: Medicare Other

## 2011-05-13 DIAGNOSIS — E039 Hypothyroidism, unspecified: Secondary | ICD-10-CM

## 2011-05-13 LAB — TSH: TSH: 0.53 u[IU]/mL (ref 0.35–5.50)

## 2011-05-13 NOTE — Progress Notes (Signed)
Quick Note:  Voice message left on PhoneTree system - lab is negative, normal or otherwise stable, pt to continue same tx ______ 

## 2011-05-24 ENCOUNTER — Ambulatory Visit: Payer: Self-pay | Admitting: Internal Medicine

## 2011-05-24 ENCOUNTER — Encounter: Payer: Self-pay | Admitting: Internal Medicine

## 2011-05-28 ENCOUNTER — Encounter: Payer: Self-pay | Admitting: Internal Medicine

## 2011-06-14 ENCOUNTER — Encounter: Payer: Self-pay | Admitting: Internal Medicine

## 2011-06-14 ENCOUNTER — Other Ambulatory Visit (INDEPENDENT_AMBULATORY_CARE_PROVIDER_SITE_OTHER): Payer: Medicare Other

## 2011-06-14 ENCOUNTER — Ambulatory Visit (INDEPENDENT_AMBULATORY_CARE_PROVIDER_SITE_OTHER): Payer: Medicare Other | Admitting: Internal Medicine

## 2011-06-14 ENCOUNTER — Other Ambulatory Visit: Payer: Self-pay | Admitting: Internal Medicine

## 2011-06-14 DIAGNOSIS — G4459 Other complicated headache syndrome: Secondary | ICD-10-CM

## 2011-06-14 DIAGNOSIS — G459 Transient cerebral ischemic attack, unspecified: Secondary | ICD-10-CM

## 2011-06-14 DIAGNOSIS — K5900 Constipation, unspecified: Secondary | ICD-10-CM

## 2011-06-14 DIAGNOSIS — E039 Hypothyroidism, unspecified: Secondary | ICD-10-CM

## 2011-06-14 DIAGNOSIS — Z79899 Other long term (current) drug therapy: Secondary | ICD-10-CM

## 2011-06-14 DIAGNOSIS — F410 Panic disorder [episodic paroxysmal anxiety] without agoraphobia: Secondary | ICD-10-CM

## 2011-06-14 LAB — COMPREHENSIVE METABOLIC PANEL
ALT: 22 U/L (ref 0–35)
AST: 25 U/L (ref 0–37)
Albumin: 4.8 g/dL (ref 3.5–5.2)
BUN: 11 mg/dL (ref 6–23)
Calcium: 9.3 mg/dL (ref 8.4–10.5)
Chloride: 101 mEq/L (ref 96–112)
Potassium: 4.3 mEq/L (ref 3.5–5.1)

## 2011-06-14 LAB — LIPID PANEL
Total CHOL/HDL Ratio: 3
VLDL: 10.2 mg/dL (ref 0.0–40.0)

## 2011-06-14 LAB — SEDIMENTATION RATE: Sed Rate: 10 mm/hr (ref 0–22)

## 2011-06-14 LAB — LDL CHOLESTEROL, DIRECT: Direct LDL: 146.1 mg/dL

## 2011-06-14 LAB — CBC WITH DIFFERENTIAL/PLATELET
Basophils Relative: 0.5 % (ref 0.0–3.0)
Eosinophils Absolute: 0 10*3/uL (ref 0.0–0.7)
Lymphocytes Relative: 39.3 % (ref 12.0–46.0)
MCHC: 33.6 g/dL (ref 30.0–36.0)
Monocytes Relative: 7.4 % (ref 3.0–12.0)
Neutrophils Relative %: 51.8 % (ref 43.0–77.0)
RBC: 4.6 Mil/uL (ref 3.87–5.11)
WBC: 3.9 10*3/uL — ABNORMAL LOW (ref 4.5–10.5)

## 2011-06-14 MED ORDER — PREGABALIN 50 MG PO CAPS: 50.0000 mg | ORAL_CAPSULE | Freq: Every day | ORAL | Status: DC

## 2011-06-14 NOTE — Assessment & Plan Note (Signed)
I have given her samples of lyrica to help with this

## 2011-06-14 NOTE — Progress Notes (Signed)
Subjective:    Patient ID: [name], female    DOB: 12-Mar-1941, 70 y.o.   MRN: [id number]  Headache  This is a recurrent problem. The current episode started more than 1 month ago. The problem occurs intermittently. The problem has been gradually worsening. The pain is located in the bilateral region. The pain does not radiate. The pain quality is not similar to prior headaches. The quality of the pain is described as aching and dull. The pain is at a severity of 3/10. The pain is moderate. Associated symptoms include insomnia and tingling (left face, arm, and leg). Pertinent negatives include no abdominal pain, abnormal behavior, anorexia, back pain, blurred vision, coughing, dizziness, drainage, ear pain, eye pain, eye redness, eye watering, facial sweating, fever, hearing loss, loss of balance, muscle aches, nausea, neck pain, numbness, phonophobia, photophobia, rhinorrhea, scalp tenderness, seizures, sinus pressure, sore throat, swollen glands, tinnitus, visual change, vomiting, weakness or weight loss. The symptoms are aggravated by nothing. She has tried acetaminophen and NSAIDs for the symptoms. The treatment provided no relief. Her past medical history is significant for migraine headaches and migraines in the family. There is no history of cancer, cluster headaches, hypertension, immunosuppression, obesity, pseudotumor cerebri, recent head traumas, sinus disease or TMJ.      Review of Systems  Constitutional: Negative for fever, chills, weight loss, diaphoresis, activity change, appetite change, fatigue and unexpected weight change.  HENT: Negative for hearing loss, ear pain, nosebleeds, congestion, sore throat, facial swelling, rhinorrhea, sneezing, drooling, mouth sores, trouble swallowing, neck pain, neck stiffness, dental problem, voice change, postnasal drip, sinus pressure, tinnitus and ear discharge.   Eyes: Negative for blurred vision, photophobia, pain, discharge, redness,  itching and visual disturbance.  Respiratory: Negative for apnea, cough, choking, chest tightness, shortness of breath, wheezing and stridor.   Cardiovascular: Negative for chest pain, palpitations and leg swelling.  Gastrointestinal: Negative for nausea, vomiting, abdominal pain, diarrhea, constipation, blood in stool, abdominal distention, anal bleeding and anorexia.  Genitourinary: Negative for dysuria, urgency, frequency, hematuria, flank pain, decreased urine volume, enuresis and difficulty urinating.  Musculoskeletal: Negative for myalgias, back pain, joint swelling, arthralgias and gait problem.  Skin: Negative for color change, pallor, rash and wound.  Neurological: Positive for tingling (left face, arm, and leg) and headaches. Negative for dizziness, tremors, seizures, syncope, facial asymmetry, speech difficulty, weakness, light-headedness, numbness and loss of balance.  Hematological: Negative for adenopathy. Does not bruise/bleed easily.  Psychiatric/Behavioral: Positive for sleep disturbance. Negative for suicidal ideas, hallucinations, behavioral problems, confusion, self-injury, dysphoric mood, decreased concentration and agitation. The patient is nervous/anxious and has insomnia. The patient is not hyperactive.        Objective:   Physical Exam  Vitals reviewed. Constitutional: She is oriented to person, place, and time. She appears well-developed and well-nourished. No distress.  HENT:  Head: Normocephalic and atraumatic.  Right Ear: External ear normal.  Left Ear: External ear normal.  Nose: Nose normal.  Mouth/Throat: Oropharynx is clear and moist. No oropharyngeal exudate.  Eyes: Conjunctivae and EOM are normal. Pupils are equal, round, and reactive to light. Right eye exhibits no discharge. Left eye exhibits no discharge. No scleral icterus.  Neck: Normal range of motion. Neck supple. No JVD present. No tracheal deviation present. No thyromegaly present.  Cardiovascular:  Normal rate, regular rhythm, normal heart sounds and intact distal pulses.  Exam reveals no gallop and no friction rub.   No murmur heard. Pulmonary/Chest: Effort normal and breath sounds normal. No stridor. No respiratory  distress. She has no wheezes. She has no rales. She exhibits no tenderness.  Abdominal: Soft. Bowel sounds are normal. She exhibits no distension and no mass. There is no tenderness. There is no rebound and no guarding.  Musculoskeletal: Normal range of motion. She exhibits no edema and no tenderness.  Lymphadenopathy:    She has no cervical adenopathy.  Neurological: She is alert and oriented to person, place, and time. She has normal strength. She is not disoriented. She displays no atrophy, no tremor and normal reflexes. No cranial nerve deficit or sensory deficit. She exhibits normal muscle tone. She displays a negative Romberg sign. She displays no seizure activity. Coordination and gait normal. She displays no Babinski's sign on the right side. She displays no Babinski's sign on the left side.  Reflex Scores:      Tricep reflexes are 1+ on the right side and 1+ on the left side.      Bicep reflexes are 1+ on the right side and 1+ on the left side.      Brachioradialis reflexes are 1+ on the right side and 1+ on the left side.      Patellar reflexes are 1+ on the right side and 1+ on the left side.      Achilles reflexes are 1+ on the right side and 1+ on the left side. Skin: Skin is warm and dry. No rash noted. She is not diaphoretic. No erythema. No pallor.  Psychiatric: She has a normal mood and affect. Her behavior is normal. Judgment and thought content normal.          Assessment & Plan:

## 2011-06-14 NOTE — Assessment & Plan Note (Signed)
She tells me that this happened 10 years ago, I will check her MRI to look for residual effects and will do some labs to look for risk factor reduction

## 2011-06-14 NOTE — Assessment & Plan Note (Signed)
I will check her TSH today 

## 2011-06-14 NOTE — Assessment & Plan Note (Addendum)
I will get a brain MRI done to look for lesions and will check labs to look for arteritis/vasculitis, I will offer an HS dose of lyrica to help with the early AM headache and also to help with her insomnia and disturbed sleep

## 2011-06-14 NOTE — Patient Instructions (Signed)
Headache, General, Without Cause The specific cause of your headache may not have been found today. There are many causes and types of headache. A few common ones are:  Tension headache.  Migraine.   Infections (examples: dental and sinus infections).  Bone and/or joint problems in the neck or jaw.   Depression.  Eye problems.   These headaches are not life threatening.  Headaches can sometimes be diagnosed by a patient history and a physical exam. Sometimes, lab and imaging studies (such as x-ray and/or CT scan) are used to rule out more serious problems. In some cases, a spinal tap (lumbar puncture) may be requested. There are many times when your exam and tests may be normal on the first visit even when there is a serious problem causing your headaches. Because of that, it is very important to follow up with your doctor or local clinic for further evaluation. HOME CARE INSTRUCTIONS  Keep follow-up appointments with your caregiver, or any specialist referral.   Only take over-the-counter or prescription medicines for pain, discomfort, or fever as directed by your caregiver.   Biofeedback, massage, or other relaxation techniques may be helpful.   Ice packs or heat applied to the head and neck can be used. Do this three to four times per day, or as needed.   Call your doctor if you have any questions or concerns.   If you smoke, you should quit.  FINDING OUT THE RESULTS OF TESTS  If a radiology test was performed, a radiologist will review your results.   You will be contacted by the emergency department or your physician if any test results require a change in your treatment plan.   Not all test results may be available during your visit. If your test results are not back during the visit, make an appointment with your caregiver to find out the results. Do not assume everything is normal if you have not heard from your caregiver or the medical facility. It is important for you to  follow up on all of your test results.  SEEK MEDICAL CARE IF:  You develop problems with medications prescribed.   You do not respond to or obtain relief from medications.   You have a change from the usual headache.   You develop nausea or vomiting.  SEEK IMMEDIATE MEDICAL CARE IF:   If your headache becomes severe.   You have an unexplained oral temperature above 100.5, or as your caregiver suggests.   You have a stiff neck.   You have loss of vision.   You have muscular weakness.   You have loss of muscular control.   You develop severe symptoms different from your first symptoms.   You start losing your balance or have trouble walking.   You feel faint or pass out.  MAKE SURE YOU:   Understand these instructions.   Will watch your condition.   Will get help right away if you are not doing well or get worse.  Document Released: 11/08/2005 Document Re-Released: 10/21/2008 [employer] Patient Information 2011 [employer]. 

## 2011-06-15 ENCOUNTER — Other Ambulatory Visit: Payer: Self-pay | Admitting: Internal Medicine

## 2011-06-15 DIAGNOSIS — Z1231 Encounter for screening mammogram for malignant neoplasm of breast: Secondary | ICD-10-CM

## 2011-06-18 ENCOUNTER — Ambulatory Visit (HOSPITAL_COMMUNITY)
Admission: RE | Admit: 2011-06-18 | Discharge: 2011-06-18 | Disposition: A | Discharge status: Home / Self Care | Payer: Medicare Other | Source: Ambulatory Visit | Attending: Internal Medicine | Admitting: Internal Medicine

## 2011-06-18 DIAGNOSIS — I6789 Other cerebrovascular disease: Secondary | ICD-10-CM | POA: Insufficient documentation

## 2011-06-18 DIAGNOSIS — R51 Headache: Secondary | ICD-10-CM | POA: Insufficient documentation

## 2011-06-18 DIAGNOSIS — G459 Transient cerebral ischemic attack, unspecified: Secondary | ICD-10-CM

## 2011-06-18 DIAGNOSIS — G4459 Other complicated headache syndrome: Secondary | ICD-10-CM

## 2011-06-25 ENCOUNTER — Encounter: Payer: Self-pay | Admitting: Internal Medicine

## 2011-07-02 ENCOUNTER — Ambulatory Visit (INDEPENDENT_AMBULATORY_CARE_PROVIDER_SITE_OTHER): Payer: Medicare Other | Admitting: Internal Medicine

## 2011-07-02 ENCOUNTER — Encounter: Payer: Self-pay | Admitting: Internal Medicine

## 2011-07-02 VITALS — BP 126/78 | HR 82 | Temp 98.7°F | Resp 16 | Wt 121.5 lb

## 2011-07-02 DIAGNOSIS — E039 Hypothyroidism, unspecified: Secondary | ICD-10-CM

## 2011-07-02 DIAGNOSIS — R209 Unspecified disturbances of skin sensation: Secondary | ICD-10-CM

## 2011-07-02 DIAGNOSIS — R200 Anesthesia of skin: Secondary | ICD-10-CM

## 2011-07-02 NOTE — Assessment & Plan Note (Signed)
She has symptoms but a normal exam and a normal MRI so I have asked her to do a NCS/EMG of her legs

## 2011-07-02 NOTE — Progress Notes (Signed)
Subjective:    Patient ID: [name], female    DOB: [birth year], 70 y.o.   MRN: [id number]  HPI She returns c/o persistent numbness and tingling in her left lower leg.   Review of Systems  Constitutional: Negative for fever, chills, diaphoresis, activity change, appetite change, fatigue and unexpected weight change.  HENT: Negative.   Eyes: Negative.   Respiratory: Negative.   Cardiovascular: Negative.   Gastrointestinal: Negative.   Genitourinary: Negative for dysuria, urgency, frequency, hematuria, decreased urine volume, enuresis, difficulty urinating and dyspareunia.  Musculoskeletal: Negative for myalgias, back pain, joint swelling, arthralgias and gait problem.  Skin: Negative for color change, pallor, rash and wound.  Neurological: Positive for numbness. Negative for dizziness, tremors, seizures, syncope, facial asymmetry, speech difficulty, weakness, light-headedness and headaches.  Psychiatric/Behavioral: Negative.        Objective:   Physical Exam  Vitals reviewed. Constitutional: She appears well-developed and well-nourished. No distress.  HENT:  Head: Normocephalic and atraumatic.  Right Ear: External ear normal.  Left Ear: External ear normal.  Nose: Nose normal.  Mouth/Throat: Oropharynx is clear and moist. No oropharyngeal exudate.  Eyes: Conjunctivae and EOM are normal. Pupils are equal, round, and reactive to light. Right eye exhibits no discharge. Left eye exhibits no discharge. No scleral icterus.  Neck: Normal range of motion. Neck supple. No JVD present. No tracheal deviation present. No thyromegaly present.  Cardiovascular: Normal rate, regular rhythm, normal heart sounds and intact distal pulses.  Exam reveals no gallop and no friction rub.   No murmur heard. Pulmonary/Chest: Effort normal and breath sounds normal. No stridor. No respiratory distress. She has no wheezes. She has no rales. She exhibits no tenderness.  Abdominal: Soft. Bowel sounds  are normal. She exhibits no distension and no mass. There is no tenderness. There is no rebound and no guarding.  Musculoskeletal: Normal range of motion. She exhibits no edema and no tenderness.  Lymphadenopathy:    She has no cervical adenopathy.  Neurological: She is alert. She has normal strength. She displays no atrophy, no tremor and normal reflexes. No cranial nerve deficit or sensory deficit. She exhibits normal muscle tone. She displays a negative Romberg sign. She displays no seizure activity. Coordination and gait normal. She displays no Babinski's sign on the right side. She displays no Babinski's sign on the left side.  Reflex Scores:      Tricep reflexes are 1+ on the right side and 1+ on the left side.      Bicep reflexes are 1+ on the right side and 1+ on the left side.      Brachioradialis reflexes are 1+ on the right side and 1+ on the left side.      Patellar reflexes are 1+ on the right side and 1+ on the left side.      Achilles reflexes are 1+ on the right side and 1+ on the left side. Skin: Skin is warm and dry. No rash noted. She is not diaphoretic. No erythema. No pallor.  Psychiatric: She has a normal mood and affect. Her behavior is normal. Judgment and thought content normal.          Lab Results  Component Value Date   WBC 3.9* 06/14/2011   HGB 14.1 06/14/2011   HCT 42.1 06/14/2011   PLT 224.0 06/14/2011   CHOL 246* 06/14/2011   TRIG 51.0 06/14/2011   HDL 83.40 06/14/2011   LDLDIRECT 146.1 06/14/2011   ALT 22 06/14/2011   AST 25 06/14/2011  NA 136 06/14/2011   K 4.3 06/14/2011   CL 101 06/14/2011   CREATININE 0.9 06/14/2011   BUN 11 06/14/2011   CO2 30 06/14/2011   TSH 2.38 06/14/2011   HGBA1C 5.9 06/14/2011   Assessment & Plan:

## 2011-07-02 NOTE — Assessment & Plan Note (Signed)
Recent TSH was normal 

## 2011-07-21 ENCOUNTER — Ambulatory Visit: Payer: Medicare Other | Admitting: Neurology

## 2011-07-29 ENCOUNTER — Ambulatory Visit
Admission: RE | Admit: 2011-07-29 | Discharge: 2011-07-29 | Disposition: A | Discharge status: Home / Self Care | Payer: Medicare Other | Source: Ambulatory Visit | Attending: Internal Medicine | Admitting: Internal Medicine

## 2011-07-29 DIAGNOSIS — Z1231 Encounter for screening mammogram for malignant neoplasm of breast: Secondary | ICD-10-CM

## 2011-07-30 LAB — HM MAMMOGRAPHY: HM Mammogram: NORMAL

## 2011-08-03 ENCOUNTER — Other Ambulatory Visit: Payer: Self-pay | Admitting: Internal Medicine

## 2011-08-03 ENCOUNTER — Encounter: Payer: Self-pay | Admitting: Internal Medicine

## 2011-08-03 ENCOUNTER — Ambulatory Visit (INDEPENDENT_AMBULATORY_CARE_PROVIDER_SITE_OTHER): Payer: Medicare Other | Admitting: Internal Medicine

## 2011-08-03 ENCOUNTER — Other Ambulatory Visit (INDEPENDENT_AMBULATORY_CARE_PROVIDER_SITE_OTHER): Payer: Medicare Other

## 2011-08-03 DIAGNOSIS — E039 Hypothyroidism, unspecified: Secondary | ICD-10-CM

## 2011-08-03 DIAGNOSIS — E7800 Pure hypercholesterolemia, unspecified: Secondary | ICD-10-CM

## 2011-08-03 DIAGNOSIS — R209 Unspecified disturbances of skin sensation: Secondary | ICD-10-CM

## 2011-08-03 DIAGNOSIS — Z23 Encounter for immunization: Secondary | ICD-10-CM

## 2011-08-03 DIAGNOSIS — R200 Anesthesia of skin: Secondary | ICD-10-CM

## 2011-08-03 DIAGNOSIS — E785 Hyperlipidemia, unspecified: Secondary | ICD-10-CM | POA: Insufficient documentation

## 2011-08-03 LAB — LIPID PANEL
HDL: 79.2 mg/dL (ref >39.00)
VLDL: 7.8 mg/dL (ref 0.0–40.0)

## 2011-08-03 LAB — LDL CHOLESTEROL, DIRECT: Direct LDL: 130 mg/dL

## 2011-08-03 LAB — COMPREHENSIVE METABOLIC PANEL
ALT: 20 U/L (ref 0–35)
Albumin: 4.2 g/dL (ref 3.5–5.2)
CO2: 27 mEq/L (ref 19–32)
Chloride: 104 mEq/L (ref 96–112)
GFR: 75.46 mL/min (ref >60.00)
Glucose, Bld: 95 mg/dL (ref 70–99)
Potassium: 4.3 mEq/L (ref 3.5–5.1)
Sodium: 141 mEq/L (ref 135–145)
Total Protein: 7.1 g/dL (ref 6.0–8.3)

## 2011-08-03 LAB — TSH: TSH: 2.71 u[IU]/mL (ref 0.35–5.50)

## 2011-08-03 NOTE — Assessment & Plan Note (Signed)
NCS and EMG will be done soon

## 2011-08-03 NOTE — Patient Instructions (Signed)

## 2011-08-03 NOTE — Progress Notes (Signed)
Addended by: Rock Nephew T on: 08/03/2011 10:52 AM   Modules accepted: Orders

## 2011-08-03 NOTE — Assessment & Plan Note (Signed)
I will check her TSH today 

## 2011-08-03 NOTE — Assessment & Plan Note (Signed)
Check FLP today. 

## 2011-08-03 NOTE — Progress Notes (Signed)
  Subjective:    Patient ID: [name], female    DOB: [birth year], 70 y.o.   MRN: [id number]  Hyperlipidemia This is a chronic problem. The current episode started more than 1 year ago. The problem is uncontrolled. Recent lipid tests were reviewed and are variable. Exacerbating diseases include hypothyroidism. She has no history of chronic renal disease, diabetes, liver disease, obesity or nephrotic syndrome. Factors aggravating her hyperlipidemia include no known factors. Pertinent negatives include no chest pain, focal sensory loss, focal weakness, leg pain, myalgias or shortness of breath. She is currently on no antihyperlipidemic treatment. There are no compliance problems.       Review of Systems  Constitutional: Negative.   HENT: Negative.   Eyes: Negative.   Respiratory: Negative.  Negative for shortness of breath.   Cardiovascular: Negative.  Negative for chest pain.  Gastrointestinal: Negative.   Genitourinary: Negative.   Musculoskeletal: Negative.  Negative for myalgias.  Neurological: Negative for focal weakness.  Hematological: Negative.   Psychiatric/Behavioral: Negative.        Objective:   Physical Exam  Vitals reviewed. Constitutional: She is oriented to person, place, and time. She appears well-developed and well-nourished. No distress.  HENT:  Mouth/Throat: Oropharynx is clear and moist. No oropharyngeal exudate.  Eyes: Conjunctivae are normal. Right eye exhibits no discharge. Left eye exhibits no discharge. No scleral icterus.  Neck: Normal range of motion. Neck supple. No JVD present. No tracheal deviation present. No thyromegaly present.  Cardiovascular: Normal rate, regular rhythm, normal heart sounds and intact distal pulses.  Exam reveals no gallop and no friction rub.   No murmur heard. Pulmonary/Chest: Effort normal and breath sounds normal. No stridor. No respiratory distress. She has no wheezes. She has no rales. She exhibits no tenderness.    Abdominal: Soft. Bowel sounds are normal. She exhibits no distension and no mass. There is no tenderness. There is no rebound and no guarding.  Musculoskeletal: Normal range of motion. She exhibits no edema and no tenderness.  Lymphadenopathy:    She has no cervical adenopathy.  Neurological: She is oriented to person, place, and time. She displays normal reflexes. She exhibits normal muscle tone.  Skin: Skin is warm and dry. No rash noted. She is not diaphoretic. No erythema. No pallor.  Psychiatric: She has a normal mood and affect. Her behavior is normal. Judgment and thought content normal.      Lab Results  Component Value Date   WBC 3.9* 06/14/2011   HGB 14.1 06/14/2011   HCT 42.1 06/14/2011   PLT 224.0 06/14/2011   CHOL 246* 06/14/2011   TRIG 51.0 06/14/2011   HDL 83.40 06/14/2011   LDLDIRECT 146.1 06/14/2011   ALT 22 06/14/2011   AST 25 06/14/2011   NA 136 06/14/2011   K 4.3 06/14/2011   CL 101 06/14/2011   CREATININE 0.9 06/14/2011   BUN 11 06/14/2011   CO2 30 06/14/2011   TSH 2.38 06/14/2011   HGBA1C 5.9 06/14/2011      Assessment & Plan:

## 2011-08-04 ENCOUNTER — Encounter: Payer: Self-pay | Admitting: Internal Medicine

## 2011-08-17 ENCOUNTER — Encounter: Payer: Self-pay | Admitting: Internal Medicine

## 2011-08-31 LAB — D-DIMER, QUANTITATIVE: D-Dimer, Quant: 0.29

## 2011-08-31 LAB — DIFFERENTIAL
Basophils Absolute: 0
Basophils Relative: 0
Lymphocytes Relative: 17
Monocytes Relative: 6
Neutro Abs: 3.4
Neutrophils Relative %: 77

## 2011-08-31 LAB — CK TOTAL AND CKMB (NOT AT ARMC)
CK, MB: 1.2
Relative Index: INVALID
Total CK: 49

## 2011-08-31 LAB — CBC
MCHC: 33.8
Platelets: 236
RDW: 13.3

## 2011-08-31 LAB — COMPREHENSIVE METABOLIC PANEL
Alkaline Phosphatase: 84
BUN: 11
Creatinine, Ser: 0.66
Glucose, Bld: 127 — ABNORMAL HIGH
Potassium: 3.7
Total Protein: 6.9

## 2011-10-19 ENCOUNTER — Encounter: Payer: Self-pay | Admitting: Gastroenterology

## 2011-10-28 ENCOUNTER — Encounter: Payer: Self-pay | Admitting: Gastroenterology

## 2011-10-28 ENCOUNTER — Telehealth: Payer: Self-pay | Admitting: *Deleted

## 2011-10-28 ENCOUNTER — Ambulatory Visit (AMBULATORY_SURGERY_CENTER): Payer: Medicare Other | Admitting: *Deleted

## 2011-10-28 VITALS — Ht 60.0 in | Wt 120.2 lb

## 2011-10-28 DIAGNOSIS — Z1211 Encounter for screening for malignant neoplasm of colon: Secondary | ICD-10-CM

## 2011-10-28 MED ORDER — PEG-KCL-NACL-NASULF-NA ASC-C 100 G PO SOLR: 1.0000 | Freq: Once | ORAL | Status: DC

## 2011-10-28 NOTE — Telephone Encounter (Signed)
Pt notified to keep colonoscopy appointment as planned. [name]

## 2011-10-28 NOTE — Telephone Encounter (Signed)
Dr. Jarold Motto; pt's last colonoscopy was 2002- normal except for constipation.  Recall assessment sheet says 2007 for colonoscopy recall; however you have written no to recall.  I have put the chart on your desk for review.  Thank, Ezra Sites

## 2011-10-28 NOTE — Telephone Encounter (Signed)
Needs 10y f/u

## 2011-11-08 ENCOUNTER — Encounter: Payer: Self-pay | Admitting: Gastroenterology

## 2011-11-08 ENCOUNTER — Ambulatory Visit (AMBULATORY_SURGERY_CENTER): Payer: Medicare Other | Admitting: Gastroenterology

## 2011-11-08 VITALS — BP 125/83 | HR 68 | Temp 97.6°F | Resp 13 | Ht 61.0 in | Wt 120.0 lb

## 2011-11-08 DIAGNOSIS — Z1211 Encounter for screening for malignant neoplasm of colon: Secondary | ICD-10-CM

## 2011-11-08 MED ORDER — SODIUM CHLORIDE 0.9 % IV SOLN: 500.0000 mL | INTRAVENOUS | Status: DC

## 2011-11-08 NOTE — Progress Notes (Signed)
Propofol administered by Sheila Camp, CRNA. Maw  Pt tolerated the colonoscopy very well. maw 

## 2011-11-08 NOTE — Op Note (Signed)
[facility] [address] [place], [place]  [address]  COLONOSCOPY PROCEDURE REPORT  PATIENT:  [name], Little  MR#:  [id number] BIRTHDATE:  09-14-1941, 70 yrs. old  GENDER:  female ENDOSCOPIST:  Vania Rea. Jarold Motto, MD, [facility] REF. BY: PROCEDURE DATE:  11/08/2011 PROCEDURE:  Average-risk screening colonoscopy G0121 ASA CLASS:  Class II INDICATIONS:  Routine Risk Screening MEDICATIONS:   propofol (Diprivan) 180 mg IV  DESCRIPTION OF PROCEDURE:   After the risks and benefits and of the procedure were explained, informed consent was obtained. Digital rectal exam was performed and revealed no abnormalities. The LB CF-[id number] endoscope was introduced through the anus and advanced to the cecum, which was identified by both the appendix and ileocecal valve.  The quality of the prep was excellent, using MoviPrep.  The instrument was then slowly withdrawn as the colon was fully examined. <<PROCEDUREIMAGES>>  FINDINGS:  No polyps or cancers were seen.  This was otherwise a normal examination of the colon.   Retroflexed views in the rectum revealed no abnormalities.    The scope was then withdrawn from the patient and the procedure completed.  COMPLICATIONS:  None ENDOSCOPIC IMPRESSION: 1) No polyps or cancers 2) Otherwise normal examination RECOMMENDATIONS: 1) Continue current colorectal screening recommendations for "routine risk" patients with a repeat colonoscopy in 10 years.  REPEAT EXAM:  No  ______________________________ Vania Rea. Jarold Motto, MD, [name]  CC:  [name], MD  n. [name] Kitchen   Vania Rea. Patterson at 11/08/2011 10:04 AM  Cleora Fleet, [id number]

## 2011-11-08 NOTE — Progress Notes (Signed)
Patient did not experience any of the following events: a burn prior to discharge; a fall within the facility; wrong site/side/patient/procedure/implant event; or a hospital transfer or hospital admission upon discharge from the facility. (G8907) Patient did not have preoperative order for IV antibiotic SSI prophylaxis. (G8918)  

## 2011-11-08 NOTE — Patient Instructions (Signed)
Discharge instructions given with verbal understanding.  Normal exam.  Resume previous medications. 

## 2011-11-09 ENCOUNTER — Telehealth: Payer: Self-pay | Admitting: *Deleted

## 2011-11-09 NOTE — Telephone Encounter (Signed)

## 2012-01-06 ENCOUNTER — Ambulatory Visit
Admission: RE | Admit: 2012-01-06 | Discharge: 2012-01-06 | Disposition: A | Discharge status: Home / Self Care | Payer: Medicare Other | Source: Ambulatory Visit | Attending: Internal Medicine | Admitting: Internal Medicine

## 2012-01-06 ENCOUNTER — Other Ambulatory Visit: Payer: Self-pay | Admitting: Internal Medicine

## 2012-01-06 DIAGNOSIS — M25569 Pain in unspecified knee: Secondary | ICD-10-CM

## 2012-03-23 ENCOUNTER — Telehealth: Payer: Self-pay | Admitting: Gastroenterology

## 2012-03-23 NOTE — Telephone Encounter (Signed)
Pt reports a new onset on pain under her sternum; she thinks she has an ulcer. Pt states she knows she has GERD, but this is lower down. Reports the pain started Tuesday and is constant unless she eats every 2 hours; she is on Protonix. Pt given an appt tomorrow at 10am.

## 2012-03-24 ENCOUNTER — Ambulatory Visit (INDEPENDENT_AMBULATORY_CARE_PROVIDER_SITE_OTHER): Payer: Medicare Other | Admitting: Gastroenterology

## 2012-03-24 ENCOUNTER — Encounter: Payer: Self-pay | Admitting: Gastroenterology

## 2012-03-24 VITALS — BP 112/72 | HR 60 | Ht 60.0 in | Wt 118.0 lb

## 2012-03-24 DIAGNOSIS — R1010 Upper abdominal pain, unspecified: Secondary | ICD-10-CM

## 2012-03-24 DIAGNOSIS — R079 Chest pain, unspecified: Secondary | ICD-10-CM

## 2012-03-24 DIAGNOSIS — R109 Unspecified abdominal pain: Secondary | ICD-10-CM

## 2012-03-24 DIAGNOSIS — K219 Gastro-esophageal reflux disease without esophagitis: Secondary | ICD-10-CM

## 2012-03-24 MED ORDER — SUCRALFATE 1 GM/10ML PO SUSP: 1.0000 g | Freq: Three times a day (TID) | ORAL | Status: DC

## 2012-03-24 MED ORDER — PANTOPRAZOLE SODIUM 40 MG PO TBEC: 40.0000 mg | DELAYED_RELEASE_TABLET | Freq: Two times a day (BID) | ORAL | Status: DC

## 2012-03-24 NOTE — Progress Notes (Signed)
This is a very nice 71 year old Caucasian female with 2 weeks worth and subxiphoid pain despite Protonix 40 mg chronically every morning. She denies any specific hepatobiliary, cardiac, or pulmonary complaints. She recently had an exercise stress test which was normal, CPX, and apparently labs were normal. Previous endoscopy has revealed hiatal hernia and acid reflux changes. The patient does not smoke, use alcohol or NSAIDs. She denies painful swallowing, dysphagia, or any systemic complaints. She takes daily Metamucil and follows a  high-fiber diet for mild constipation. Colonoscopy in December of 2012 was unremarkable. . Family history is noncontributory. Review of her labs shows a normal CBC a metabolic profile.  Current Medications, Allergies, Past Medical History, Past Surgical History, Family History and Social History were reviewed in [employer] record.  Pertinent Review of Systems Negative... no history of drainage the mom or symptoms of collagen vascular disease. She also denies cough, sputum production, hemoptocysis palpitations, chest pain with exertion, or any specific hepatobiliary complaints, history of chest or abdominal trauma. She is on aspirin 81 mg a day. She has not been on recent antibiotics or steroid preparations.   Physical Exam: Healthy-appearing patient in no distress appearing her stated age. Blood pressure 112/72 and pulse 60 and regular. Weight 118 pounds. I cannot appreciate stigmata of chronic liver disease. She is in a regular rhythm without murmurs gallops or rubs. Chest is clear to percussion and auscultation. I cannot appreciate hepatosplenomegaly, abdominal masses or tenderness. Bowel sounds are normal. Mental status is normal.  Extremities are unremarkable without evidence of phlebitis, swollen joints or edema.    Assessment and Plan: Worsening acid reflux with probable enlarging hiatal hernia. I've schedule ultrasound to exclude  cholelithiasis. I have increased her Protonix to 40 mg twice a day with PC and each bedtime Carafate suspension, when necessary sublingual Levsin, strict antireflux maneuvers, and office followup in 2 weeks' time. She is to continue her other medications as listed and reviewed per primary care. Encounter Diagnosis  Name Primary?  Marland Kitchen Upper abdominal pain Yes

## 2012-03-24 NOTE — Patient Instructions (Signed)
We have sent the following medications to your pharmacy for you to pick up at your convenience: Protonix and Carafate.  Patient advised to avoid spicy, acidic, citrus, chocolate, mints, fruit and fruit juices.  Limit the intake of caffeine, alcohol and Soda.  Don't exercise too soon after eating.  Don't lie down within 3-4 hours of eating.  Elevate the head of your bed.   You have been scheduled for an abdominal ultrasound at [facility] (1st floor of hospital) on 03/27/12 at 8:00am. Please arrive 15 minutes prior to your appointment for registration. Make certain not to have anything to eat or drink 6 hours prior to your appointment. Should you need to reschedule your appointment, please contact radiology at [phone].  cc: Sanda Linger, MD

## 2012-03-27 ENCOUNTER — Other Ambulatory Visit (HOSPITAL_COMMUNITY): Payer: Medicare Other

## 2012-03-27 ENCOUNTER — Ambulatory Visit (HOSPITAL_COMMUNITY)
Admission: RE | Admit: 2012-03-27 | Discharge: 2012-03-27 | Disposition: A | Discharge status: Home / Self Care | Payer: Medicare Other | Source: Ambulatory Visit | Attending: Gastroenterology | Admitting: Gastroenterology

## 2012-03-27 DIAGNOSIS — R1010 Upper abdominal pain, unspecified: Secondary | ICD-10-CM

## 2012-03-27 DIAGNOSIS — R109 Unspecified abdominal pain: Secondary | ICD-10-CM | POA: Insufficient documentation

## 2012-03-27 DIAGNOSIS — K589 Irritable bowel syndrome without diarrhea: Secondary | ICD-10-CM | POA: Insufficient documentation

## 2012-03-27 DIAGNOSIS — E785 Hyperlipidemia, unspecified: Secondary | ICD-10-CM | POA: Insufficient documentation

## 2012-03-27 DIAGNOSIS — K219 Gastro-esophageal reflux disease without esophagitis: Secondary | ICD-10-CM | POA: Insufficient documentation

## 2012-03-27 DIAGNOSIS — K449 Diaphragmatic hernia without obstruction or gangrene: Secondary | ICD-10-CM | POA: Insufficient documentation

## 2012-04-09 ENCOUNTER — Other Ambulatory Visit: Payer: Self-pay | Admitting: Internal Medicine

## 2012-04-14 ENCOUNTER — Ambulatory Visit: Payer: Medicare Other | Admitting: Gastroenterology

## 2012-05-26 ENCOUNTER — Ambulatory Visit (INDEPENDENT_AMBULATORY_CARE_PROVIDER_SITE_OTHER)
Admission: RE | Admit: 2012-05-26 | Discharge: 2012-05-26 | Disposition: A | Discharge status: Home / Self Care | Payer: Medicare Other | Source: Ambulatory Visit | Attending: Internal Medicine | Admitting: Internal Medicine

## 2012-05-26 ENCOUNTER — Encounter: Payer: Self-pay | Admitting: Internal Medicine

## 2012-05-26 ENCOUNTER — Ambulatory Visit (INDEPENDENT_AMBULATORY_CARE_PROVIDER_SITE_OTHER): Payer: Medicare Other | Admitting: Internal Medicine

## 2012-05-26 ENCOUNTER — Other Ambulatory Visit (INDEPENDENT_AMBULATORY_CARE_PROVIDER_SITE_OTHER): Payer: Medicare Other

## 2012-05-26 VITALS — BP 120/80 | HR 64 | Temp 98.4°F | Resp 16 | Wt 116.8 lb

## 2012-05-26 DIAGNOSIS — G4459 Other complicated headache syndrome: Secondary | ICD-10-CM

## 2012-05-26 DIAGNOSIS — E7800 Pure hypercholesterolemia, unspecified: Secondary | ICD-10-CM

## 2012-05-26 DIAGNOSIS — E039 Hypothyroidism, unspecified: Secondary | ICD-10-CM

## 2012-05-26 LAB — CBC WITH DIFFERENTIAL/PLATELET
Basophils Relative: 0.7 % (ref 0.0–3.0)
Eosinophils Relative: 0.5 % (ref 0.0–5.0)
Hemoglobin: 13.1 g/dL (ref 12.0–15.0)
Lymphocytes Relative: 36 % (ref 12.0–46.0)
MCV: 93.2 fl (ref 78.0–100.0)
Monocytes Absolute: 0.4 10*3/uL (ref 0.1–1.0)
Neutro Abs: 2.3 10*3/uL (ref 1.4–7.7)
Neutrophils Relative %: 54.5 % (ref 43.0–77.0)
RBC: 4.24 Mil/uL (ref 3.87–5.11)
WBC: 4.2 10*3/uL — ABNORMAL LOW (ref 4.5–10.5)

## 2012-05-26 LAB — SEDIMENTATION RATE: Sed Rate: 11 mm/hr (ref 0–22)

## 2012-05-26 LAB — LIPID PANEL
HDL: 73.4 mg/dL (ref >39.00)
LDL Cholesterol: 117 mg/dL — ABNORMAL HIGH (ref 0–99)
Total CHOL/HDL Ratio: 3
Triglycerides: 45 mg/dL (ref 0.0–149.0)

## 2012-05-26 LAB — COMPREHENSIVE METABOLIC PANEL
ALT: 17 U/L (ref 0–35)
AST: 20 U/L (ref 0–37)
Alkaline Phosphatase: 45 U/L (ref 39–117)
Creatinine, Ser: 0.8 mg/dL (ref 0.4–1.2)
Sodium: 139 mEq/L (ref 135–145)
Total Bilirubin: 0.8 mg/dL (ref 0.3–1.2)
Total Protein: 7 g/dL (ref 6.0–8.3)

## 2012-05-26 NOTE — Assessment & Plan Note (Signed)
I will check her TSH today and will adjust her dose if needed 

## 2012-05-26 NOTE — Assessment & Plan Note (Signed)
I will recheck her FLP today 

## 2012-05-26 NOTE — Patient Instructions (Signed)

## 2012-05-26 NOTE — Assessment & Plan Note (Signed)
She has a new type of HA compared to her prior HA's and it is lasting longer so I will scan her brain today with a CT to look for bleed, mass, inflammation, also will check labs to look for vasculitis, anemia, abnormal lytes, etc

## 2012-05-26 NOTE — Progress Notes (Signed)
Subjective:    Patient ID: [name], female    DOB: 1941/02/07, 71 y.o.   MRN: [id number]  Headache  This is a new problem. Episode onset: one week ago, she got overheated in the yard and developed a right-sided HA. The problem occurs intermittently. The problem has been gradually improving. The pain is located in the right unilateral and temporal region. The pain does not radiate. The pain quality is similar to prior headaches. The quality of the pain is described as dull. The pain is at a severity of 2/10. The pain is mild. Pertinent negatives include no abdominal pain, abnormal behavior, anorexia, back pain, blurred vision, coughing, dizziness, drainage, ear pain, eye pain, eye redness, eye watering, facial sweating, fever, hearing loss, insomnia, loss of balance, muscle aches, nausea, neck pain, numbness, phonophobia, photophobia, rhinorrhea, scalp tenderness, seizures, sinus pressure, sore throat, swollen glands, tingling, tinnitus, visual change, vomiting, weakness or weight loss. She has tried nothing for the symptoms. Her past medical history is significant for migraine headaches. There is no history of cancer, cluster headaches, hypertension, immunosuppression or recent head traumas.      Review of Systems  Constitutional: Negative for fever, chills, weight loss, diaphoresis, activity change, appetite change, fatigue and unexpected weight change.  HENT: Negative for hearing loss, ear pain, congestion, sore throat, facial swelling, rhinorrhea, trouble swallowing, neck pain, neck stiffness, voice change, sinus pressure and tinnitus.   Eyes: Negative for blurred vision, photophobia, pain, discharge, redness, itching and visual disturbance.  Respiratory: Negative for cough.   Cardiovascular: Negative for chest pain, palpitations and leg swelling.  Gastrointestinal: Negative for nausea, vomiting, abdominal pain, diarrhea, constipation, blood in stool, abdominal distention, anal bleeding,  rectal pain and anorexia.  Genitourinary: Negative.   Musculoskeletal: Negative.  Negative for back pain.  Skin: Negative for color change, pallor, rash and wound.  Neurological: Positive for headaches. Negative for dizziness, tingling, tremors, seizures, syncope, facial asymmetry, speech difficulty, weakness, light-headedness, numbness and loss of balance.  Hematological: Negative for adenopathy. Does not bruise/bleed easily.  Psychiatric/Behavioral: Negative.  The patient does not have insomnia.        Objective:   Physical Exam  Vitals reviewed. Constitutional: She is oriented to person, place, and time. Vital signs are normal. She appears well-developed and well-nourished.  Non-toxic appearance. She does not have a sickly appearance. She does not appear ill. No distress.  HENT:  Head: Normocephalic and atraumatic.  Mouth/Throat: Oropharynx is clear and moist. No oropharyngeal exudate.  Eyes: Conjunctivae and EOM are normal. Pupils are equal, round, and reactive to light. Right eye exhibits no discharge. Left eye exhibits no discharge. No scleral icterus.  Neck: Normal range of motion. Neck supple. No JVD present. No tracheal deviation present. No thyromegaly present.  Cardiovascular: Normal rate, regular rhythm, normal heart sounds and intact distal pulses.  Exam reveals no gallop and no friction rub.   No murmur heard. Pulmonary/Chest: Effort normal and breath sounds normal. No stridor. No respiratory distress. She has no wheezes. She has no rales. She exhibits no tenderness.  Abdominal: Soft. Bowel sounds are normal. She exhibits no distension and no mass. There is no tenderness. There is no rebound and no guarding.  Musculoskeletal: Normal range of motion. She exhibits no edema and no tenderness.  Lymphadenopathy:    She has no cervical adenopathy.  Neurological: She is alert and oriented to person, place, and time. She displays no atrophy, no tremor and normal reflexes. No cranial  nerve deficit or sensory deficit.  She exhibits normal muscle tone. She displays no seizure activity. Coordination and gait normal. She displays no Babinski's sign on the right side. She displays no Babinski's sign on the left side.  Skin: Skin is warm and dry. No rash noted. She is not diaphoretic. No erythema. No pallor.  Psychiatric: She has a normal mood and affect. Her behavior is normal. Judgment and thought content normal.     Lab Results  Component Value Date   WBC 3.9* 06/14/2011   HGB 14.1 06/14/2011   HCT 42.1 06/14/2011   PLT 224.0 06/14/2011   GLUCOSE 95 08/03/2011   CHOL 215* 08/03/2011   TRIG 39.0 08/03/2011   HDL 79.20 08/03/2011   LDLDIRECT 130.0 08/03/2011   ALT 20 08/03/2011   AST 24 08/03/2011   NA 141 08/03/2011   K 4.3 08/03/2011   CL 104 08/03/2011   CREATININE 0.8 08/03/2011   BUN 14 08/03/2011   CO2 27 08/03/2011   TSH 2.71 08/03/2011   HGBA1C 5.9 06/14/2011       Assessment & Plan:

## 2012-06-19 ENCOUNTER — Encounter: Payer: Self-pay | Admitting: Internal Medicine

## 2012-06-19 ENCOUNTER — Other Ambulatory Visit: Payer: Self-pay | Admitting: Internal Medicine

## 2012-06-19 ENCOUNTER — Ambulatory Visit (INDEPENDENT_AMBULATORY_CARE_PROVIDER_SITE_OTHER): Payer: Medicare Other | Admitting: Internal Medicine

## 2012-06-19 VITALS — BP 130/80 | HR 80 | Temp 98.4°F | Resp 16 | Wt 115.0 lb

## 2012-06-19 DIAGNOSIS — F410 Panic disorder [episodic paroxysmal anxiety] without agoraphobia: Secondary | ICD-10-CM

## 2012-06-19 DIAGNOSIS — R040 Epistaxis: Secondary | ICD-10-CM

## 2012-06-19 DIAGNOSIS — E039 Hypothyroidism, unspecified: Secondary | ICD-10-CM

## 2012-06-19 DIAGNOSIS — Z1231 Encounter for screening mammogram for malignant neoplasm of breast: Secondary | ICD-10-CM

## 2012-06-19 MED ORDER — ALPRAZOLAM 0.25 MG PO TABS: 0.2500 mg | ORAL_TABLET | Freq: Three times a day (TID) | ORAL | Status: DC | PRN

## 2012-06-19 NOTE — Assessment & Plan Note (Signed)
Her recent TSH looks good 

## 2012-06-19 NOTE — Assessment & Plan Note (Signed)
She will stop taking asa, I have asked her to see an ENT to see if she needs to have a procedure done to prevent bleeding

## 2012-06-19 NOTE — Assessment & Plan Note (Signed)
She wants a slight increase in her xanax dose so I changed the Rx

## 2012-06-19 NOTE — Patient Instructions (Signed)

## 2012-06-19 NOTE — Progress Notes (Signed)
Subjective:    Patient ID: [name], female    DOB: 09/22/1941, 71 y.o.   MRN: [id number]  HPI  She returns for f/up and she tells me that she had another nosebleed yesterday. She had no warning and suddenly had blood coming from both sides of her nose, EMS came and packed her nose and the bleeding stopped. She feels well today.  Review of Systems  Constitutional: Negative.   HENT: Positive for nosebleeds. Negative for hearing loss, ear pain, congestion, sore throat, facial swelling, rhinorrhea, sneezing, drooling, mouth sores, trouble swallowing, neck pain, neck stiffness, dental problem, voice change, postnasal drip, sinus pressure, tinnitus and ear discharge.   Eyes: Negative.   Respiratory: Negative.   Cardiovascular: Negative for chest pain, palpitations and leg swelling.  Gastrointestinal: Negative.   Genitourinary: Negative.   Musculoskeletal: Negative.   Skin: Negative for color change, pallor, rash and wound.  Neurological: Negative for dizziness, tremors, seizures, syncope, facial asymmetry, speech difficulty, weakness, light-headedness, numbness and headaches.  Hematological: Negative for adenopathy. Does not bruise/bleed easily.  Psychiatric/Behavioral: Positive for disturbed wake/sleep cycle. Negative for suicidal ideas, hallucinations, behavioral problems, confusion, self-injury, dysphoric mood, decreased concentration and agitation. The patient is nervous/anxious. The patient is not hyperactive.        Objective:   Physical Exam  Vitals reviewed. Constitutional: She is oriented to person, place, and time. She appears well-developed and well-nourished. No distress.  HENT:  Head: Normocephalic and atraumatic. No trismus in the jaw.  Nose: No mucosal edema, rhinorrhea, nose lacerations, sinus tenderness, nasal deformity, septal deviation or nasal septal hematoma. Epistaxis (scant amount of dried blood on bilateral anterior nares, no active bleeding) is observed.  No  foreign bodies. Right sinus exhibits no maxillary sinus tenderness and no frontal sinus tenderness. Left sinus exhibits no maxillary sinus tenderness and no frontal sinus tenderness.  Mouth/Throat: Oropharynx is clear and moist and mucous membranes are normal. Mucous membranes are not pale, not dry and not cyanotic. No uvula swelling. No oropharyngeal exudate, posterior oropharyngeal edema, posterior oropharyngeal erythema or tonsillar abscesses.  Eyes: Conjunctivae are normal. Right eye exhibits no discharge. Left eye exhibits no discharge. No scleral icterus.  Neck: Normal range of motion. Neck supple. No JVD present. No tracheal deviation present. No thyromegaly present.  Cardiovascular: Normal rate, regular rhythm, normal heart sounds and intact distal pulses.  Exam reveals no gallop and no friction rub.   No murmur heard. Pulmonary/Chest: Effort normal and breath sounds normal. No stridor. No respiratory distress. She has no wheezes. She has no rales. She exhibits no tenderness.  Abdominal: Soft. Bowel sounds are normal. She exhibits no distension and no mass. There is no tenderness. There is no rebound and no guarding.  Musculoskeletal: Normal range of motion. She exhibits no edema and no tenderness.  Lymphadenopathy:    She has no cervical adenopathy.  Neurological: She is oriented to person, place, and time.  Skin: Skin is warm and dry. No rash noted. She is not diaphoretic. No erythema. No pallor.  Psychiatric: She has a normal mood and affect. Her behavior is normal. Judgment and thought content normal.      Lab Results  Component Value Date   WBC 4.2* 05/26/2012   HGB 13.1 05/26/2012   HCT 39.5 05/26/2012   PLT 215.0 05/26/2012   GLUCOSE 103* 05/26/2012   CHOL 199 05/26/2012   TRIG 45.0 05/26/2012   HDL 73.40 05/26/2012   LDLDIRECT 130.0 08/03/2011   LDLCALC 117* 05/26/2012   ALT 17  05/26/2012   AST 20 05/26/2012   NA 139 05/26/2012   K 5.5* 05/26/2012   CL 104 05/26/2012   CREATININE 0.8 05/26/2012     BUN 10 05/26/2012   CO2 28 05/26/2012   TSH 2.66 05/26/2012   HGBA1C 5.9 06/14/2011      Assessment & Plan:

## 2012-07-31 ENCOUNTER — Ambulatory Visit
Admission: RE | Admit: 2012-07-31 | Discharge: 2012-07-31 | Disposition: A | Discharge status: Home / Self Care | Payer: Medicare Other | Source: Ambulatory Visit | Attending: Internal Medicine | Admitting: Internal Medicine

## 2012-07-31 DIAGNOSIS — Z1231 Encounter for screening mammogram for malignant neoplasm of breast: Secondary | ICD-10-CM

## 2012-09-01 ENCOUNTER — Telehealth: Payer: Self-pay | Admitting: Internal Medicine

## 2012-09-01 NOTE — Telephone Encounter (Signed)
The patient called and is hoping to get "everything tested" with blood work.  I offered her an office visit but she stated she only wants to come in for blood work.   Does she need an ov for this type of blood work?   Callback [phone]

## 2012-09-01 NOTE — Telephone Encounter (Signed)
MEDICARE DOES NOT ALLOW BLOOD WORK WITHOUT A VISIT

## 2012-09-07 ENCOUNTER — Other Ambulatory Visit: Payer: Self-pay | Admitting: Internal Medicine

## 2012-09-07 DIAGNOSIS — E039 Hypothyroidism, unspecified: Secondary | ICD-10-CM

## 2012-09-15 ENCOUNTER — Encounter: Payer: Self-pay | Admitting: Internal Medicine

## 2012-09-15 ENCOUNTER — Ambulatory Visit (INDEPENDENT_AMBULATORY_CARE_PROVIDER_SITE_OTHER): Payer: Medicare Other | Admitting: Internal Medicine

## 2012-09-15 ENCOUNTER — Other Ambulatory Visit (INDEPENDENT_AMBULATORY_CARE_PROVIDER_SITE_OTHER): Payer: Medicare Other

## 2012-09-15 VITALS — BP 110/72 | HR 66 | Temp 98.6°F | Resp 16 | Wt 114.0 lb

## 2012-09-15 DIAGNOSIS — R7309 Other abnormal glucose: Secondary | ICD-10-CM

## 2012-09-15 DIAGNOSIS — Z23 Encounter for immunization: Secondary | ICD-10-CM

## 2012-09-15 DIAGNOSIS — M949 Disorder of cartilage, unspecified: Secondary | ICD-10-CM

## 2012-09-15 DIAGNOSIS — E039 Hypothyroidism, unspecified: Secondary | ICD-10-CM

## 2012-09-15 DIAGNOSIS — M8580 Other specified disorders of bone density and structure, unspecified site: Secondary | ICD-10-CM | POA: Insufficient documentation

## 2012-09-15 DIAGNOSIS — R739 Hyperglycemia, unspecified: Secondary | ICD-10-CM | POA: Insufficient documentation

## 2012-09-15 DIAGNOSIS — M899 Disorder of bone, unspecified: Secondary | ICD-10-CM

## 2012-09-15 DIAGNOSIS — E7800 Pure hypercholesterolemia, unspecified: Secondary | ICD-10-CM

## 2012-09-15 LAB — BASIC METABOLIC PANEL
CO2: 27 mEq/L (ref 19–32)
Calcium: 9 mg/dL (ref 8.4–10.5)
Creatinine, Ser: 0.7 mg/dL (ref 0.4–1.2)
Glucose, Bld: 79 mg/dL (ref 70–99)

## 2012-09-15 LAB — TSH: TSH: 2.52 u[IU]/mL (ref 0.35–5.50)

## 2012-09-15 NOTE — Progress Notes (Signed)
  Subjective:    Patient ID: [name], female    DOB: 11/25/1940, 71 y.o.   MRN: [id number]  Thyroid Problem Presents for follow-up visit. Patient reports no anxiety, cold intolerance, constipation, depressed mood, diaphoresis, diarrhea, dry skin, fatigue, hair loss, heat intolerance, hoarse voice, leg swelling, menstrual problem, nail problem, palpitations, tremors, visual change, weight gain or weight loss. The symptoms have been stable.      Review of Systems  Constitutional: Negative for fever, chills, weight loss, weight gain, diaphoresis, activity change, appetite change, fatigue and unexpected weight change.  HENT: Negative.  Negative for hoarse voice.   Eyes: Negative.   Respiratory: Negative for cough, chest tightness, shortness of breath, wheezing and stridor.   Cardiovascular: Negative for chest pain, palpitations and leg swelling.  Gastrointestinal: Negative for nausea, vomiting, abdominal pain, diarrhea, constipation and anal bleeding.  Genitourinary: Negative.  Negative for menstrual problem.  Musculoskeletal: Negative for myalgias, back pain, joint swelling, arthralgias and gait problem.  Skin: Negative for color change, pallor, rash and wound.  Neurological: Negative for dizziness, tremors, seizures, syncope, facial asymmetry, speech difficulty, weakness, light-headedness, numbness and headaches.  Hematological: Negative for cold intolerance, heat intolerance and adenopathy. Does not bruise/bleed easily.  Psychiatric/Behavioral: Negative.        Objective:   Physical Exam  Vitals reviewed. Constitutional: She is oriented to person, place, and time. She appears well-developed and well-nourished. No distress.  HENT:  Head: Normocephalic and atraumatic.  Mouth/Throat: Oropharynx is clear and moist. No oropharyngeal exudate.  Eyes: Conjunctivae normal are normal. Right eye exhibits no discharge. Left eye exhibits no discharge. No scleral icterus.  Neck: Normal  range of motion. Neck supple. No JVD present. No tracheal deviation present. No thyromegaly present.  Cardiovascular: Normal rate, regular rhythm, normal heart sounds and intact distal pulses.  Exam reveals no gallop and no friction rub.   No murmur heard. Pulmonary/Chest: Effort normal and breath sounds normal. No stridor. No respiratory distress. She has no wheezes. She has no rales. She exhibits no tenderness.  Abdominal: Soft. Bowel sounds are normal. She exhibits no distension and no mass. There is no tenderness. There is no rebound and no guarding.  Musculoskeletal: Normal range of motion. She exhibits no edema and no tenderness.  Lymphadenopathy:    She has no cervical adenopathy.  Neurological: She is oriented to person, place, and time.  Skin: Skin is warm and dry. No rash noted. She is not diaphoretic. No erythema. No pallor.  Psychiatric: She has a normal mood and affect. Her behavior is normal. Judgment and thought content normal.     Lab Results  Component Value Date   WBC 4.2* 05/26/2012   HGB 13.1 05/26/2012   HCT 39.5 05/26/2012   PLT 215.0 05/26/2012   GLUCOSE 103* 05/26/2012   CHOL 199 05/26/2012   TRIG 45.0 05/26/2012   HDL 73.40 05/26/2012   LDLDIRECT 130.0 08/03/2011   LDLCALC 117* 05/26/2012   ALT 17 05/26/2012   AST 20 05/26/2012   NA 139 05/26/2012   K 5.5* 05/26/2012   CL 104 05/26/2012   CREATININE 0.8 05/26/2012   BUN 10 05/26/2012   CO2 28 05/26/2012   TSH 2.66 05/26/2012   HGBA1C 5.9 06/14/2011       Assessment & Plan:

## 2012-09-15 NOTE — Assessment & Plan Note (Signed)
I will check her A1C to see if she has developed DM II 

## 2012-09-15 NOTE — Patient Instructions (Signed)

## 2012-09-15 NOTE — Assessment & Plan Note (Signed)
She is due for an updated DEXA scan 

## 2012-09-15 NOTE — Assessment & Plan Note (Signed)
I will check her TSH today 

## 2012-09-15 NOTE — Assessment & Plan Note (Signed)
CMP today

## 2012-09-18 ENCOUNTER — Telehealth: Payer: Self-pay | Admitting: Internal Medicine

## 2012-09-18 NOTE — Telephone Encounter (Signed)
Caller: [name]/Patient; Patient Name: [name]; PCP: Sanda Linger (Adults only); Best Callback Phone Number: [phone]. Reports pain, swelling, and slight redness to the right arm where the Shingles vaccine was given on 09/15/12. Onset of symptoms 09/15/12. Information regarding common side effects (redness, pain, itching, swelling, hard lump, warmth, or bruising at the injection site, headache) explained to patient. Information given to patient from Shingles vaccine (zostavax) website. Patient reports that the symptoms she is having have improved since getting the injection. Informed the patient to call back if symptoms have not gone away by 09/19/12. Patient verbalized understanding.

## 2013-01-08 ENCOUNTER — Other Ambulatory Visit: Payer: Self-pay | Admitting: Internal Medicine

## 2013-01-18 ENCOUNTER — Other Ambulatory Visit: Payer: Self-pay | Admitting: Internal Medicine

## 2013-01-18 DIAGNOSIS — M810 Age-related osteoporosis without current pathological fracture: Secondary | ICD-10-CM

## 2013-02-09 ENCOUNTER — Ambulatory Visit
Admission: RE | Admit: 2013-02-09 | Discharge: 2013-02-09 | Disposition: A | Discharge status: Home / Self Care | Payer: Medicare Other | Source: Ambulatory Visit | Attending: Internal Medicine | Admitting: Internal Medicine

## 2013-02-09 ENCOUNTER — Encounter: Payer: Self-pay | Admitting: Internal Medicine

## 2013-02-09 DIAGNOSIS — M810 Age-related osteoporosis without current pathological fracture: Secondary | ICD-10-CM

## 2013-02-09 LAB — HM DEXA SCAN: HM Dexa Scan: -1.9

## 2013-02-22 ENCOUNTER — Ambulatory Visit (INDEPENDENT_AMBULATORY_CARE_PROVIDER_SITE_OTHER): Payer: Medicare Other | Admitting: Gastroenterology

## 2013-02-22 ENCOUNTER — Encounter: Payer: Self-pay | Admitting: Gastroenterology

## 2013-02-22 VITALS — BP 118/64 | HR 80 | Ht 60.0 in | Wt 119.0 lb

## 2013-02-22 DIAGNOSIS — R1013 Epigastric pain: Secondary | ICD-10-CM

## 2013-02-22 DIAGNOSIS — R110 Nausea: Secondary | ICD-10-CM

## 2013-02-22 MED ORDER — CILIDINIUM-CHLORDIAZEPOXIDE 2.5-5 MG PO CAPS: 1.0000 | ORAL_CAPSULE | Freq: Two times a day (BID) | ORAL | Status: DC

## 2013-02-22 NOTE — Patient Instructions (Addendum)
  You have been scheduled for an abdominal ultrasound at [facility] (1st floor of hospital) on 02-23-13 at 9 AM. Please arrive 15 minutes prior to your appointment for registration. Make certain not to have anything to eat or drink 6 hours prior to your appointment. Should you need to reschedule your appointment, please contact radiology at [phone]. This test typically takes about 30 minutes to perform.  Librax was sent to your pharmacy, please take one capsule by mouth twice daily. ________________________________________________________________________________________________________________                                               We are excited to introduce MyChart, a new best-in-class service that provides you online access to important information in your electronic medical record. We want to make it easier for you to view your health information - all in one secure location - when and where you need it. We expect MyChart will enhance the quality of care and service we provide.  When you register for MyChart, you can:    View your test results.    Request appointments and receive appointment reminders via email.    Request medication renewals.    View your medical history, allergies, medications and immunizations.    Communicate with your physician's office through a password-protected site.    Conveniently print information such as your medication lists.  To find out if MyChart is right for you, please talk to a member of our clinical staff today. We will gladly answer your questions about this free health and wellness tool.  If you are age 71 or older and want a member of your family to have access to your record, you must provide written consent by completing a proxy form available at our office. Please speak to our clinical staff about guidelines regarding accounts for patients younger than age 46.  As you activate your MyChart account and need any technical  assistance, please call the MyChart technical support line at [phone]) or email your question to [email or web address]. If you email your question(s), please include your name, a return phone number and the best time to reach you.  If you have non-urgent health-related questions, you can send a message to our office through MyChart at [email or web address]. If you have a medical emergency, call 911.  Thank you for using MyChart as your new health and wellness resource!   MyChart licensed from [employer],  [id number]. Patents Pending.

## 2013-02-22 NOTE — Progress Notes (Signed)
This is a 72 year old Caucasian female with recurrent discomfort in her subxiphoid area which is been present for several years.  Previous endoscopic exams are been unremarkable, and she is on chronic Protonix 40 mg a day.  She describes the pain as feeling like" a hole" , and she seemed to do well he frequent small meals.  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  She denies lower GI or specific hepatobiliary complaints.  Previous ultrasound has suggested biliary sludge.  The patient relates her recent liver function test and CBC were normal by her primary care physician.  There is no anorexia, weight loss, or systemic complaints.  Current Medications, Allergies, Past Medical History, Past Surgical History, Family History and Social History were reviewed in [employer] record.  ROS: All systems were reviewed and are negative unless otherwise stated in the HPI.          Physical Exam: Healthy-appearing patient in no distress.  Blood pressure 118/64, pulse 80 and regular, and weight 119 the BMI of 23.24.  I cannot appreciate stigmata of chronic liver disease.  Her chest is clear and she is in a regular rhythm without murmurs gallops or rubs.  There is no organomegaly, abdominal masses, and only slight tenderness to deep palpation the subxiphoid area.  Bowel sounds are normal.    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  We will continue Protonix and add twice a day Librax as tolerated.  Repeat upper abdominal ultrasound exam order. Encounter Diagnoses  Name Primary?  . Abdominal pain, epigastric Yes  . Nausea alone

## 2013-02-23 ENCOUNTER — Ambulatory Visit (HOSPITAL_COMMUNITY)
Admission: RE | Admit: 2013-02-23 | Discharge: 2013-02-23 | Disposition: A | Discharge status: Home / Self Care | Payer: Medicare Other | Source: Ambulatory Visit | Attending: Gastroenterology | Admitting: Gastroenterology

## 2013-02-23 DIAGNOSIS — R1013 Epigastric pain: Secondary | ICD-10-CM | POA: Insufficient documentation

## 2013-02-23 DIAGNOSIS — R110 Nausea: Secondary | ICD-10-CM

## 2013-02-23 DIAGNOSIS — K838 Other specified diseases of biliary tract: Secondary | ICD-10-CM | POA: Insufficient documentation

## 2013-02-27 ENCOUNTER — Other Ambulatory Visit: Payer: Self-pay | Admitting: Gastroenterology

## 2013-02-27 ENCOUNTER — Telehealth: Payer: Self-pay | Admitting: Gastroenterology

## 2013-02-27 DIAGNOSIS — R109 Unspecified abdominal pain: Secondary | ICD-10-CM

## 2013-02-27 NOTE — Telephone Encounter (Signed)
Pt scheduled for HIDA scan at [facility] 03/19/13. Pt to arrive at 6:45am for a 7am appt, pt to be NPO after midnight. Pt aware of appt date and time.

## 2013-02-27 NOTE — Telephone Encounter (Signed)
Had ultrasound Friday.  Wants to know if we have the results.

## 2013-03-16 ENCOUNTER — Other Ambulatory Visit: Payer: Self-pay | Admitting: Internal Medicine

## 2013-03-19 ENCOUNTER — Other Ambulatory Visit: Payer: Self-pay | Admitting: Internal Medicine

## 2013-03-19 ENCOUNTER — Encounter (HOSPITAL_COMMUNITY): Payer: Medicare Other

## 2013-03-19 DIAGNOSIS — M79609 Pain in unspecified limb: Secondary | ICD-10-CM

## 2013-03-21 ENCOUNTER — Ambulatory Visit
Admission: RE | Admit: 2013-03-21 | Discharge: 2013-03-21 | Disposition: A | Discharge status: Home / Self Care | Payer: Medicare Other | Source: Ambulatory Visit | Attending: Internal Medicine | Admitting: Internal Medicine

## 2013-03-21 DIAGNOSIS — M79609 Pain in unspecified limb: Secondary | ICD-10-CM

## 2013-03-23 ENCOUNTER — Ambulatory Visit
Admission: RE | Admit: 2013-03-23 | Discharge: 2013-03-23 | Disposition: A | Discharge status: Home / Self Care | Payer: Medicare Other | Source: Ambulatory Visit | Attending: Internal Medicine | Admitting: Internal Medicine

## 2013-03-23 DIAGNOSIS — M79609 Pain in unspecified limb: Secondary | ICD-10-CM

## 2013-04-11 ENCOUNTER — Telehealth: Payer: Self-pay | Admitting: Internal Medicine

## 2013-04-11 ENCOUNTER — Encounter (HOSPITAL_COMMUNITY): Payer: Self-pay | Admitting: Family Medicine

## 2013-04-11 ENCOUNTER — Emergency Department (HOSPITAL_COMMUNITY): Payer: Medicare Other

## 2013-04-11 ENCOUNTER — Emergency Department (HOSPITAL_COMMUNITY)
Admission: EM | Admit: 2013-04-11 | Discharge: 2013-04-11 | Disposition: A | Discharge status: Home / Self Care | Payer: Medicare Other | Attending: Emergency Medicine | Admitting: Emergency Medicine

## 2013-04-11 DIAGNOSIS — Z8679 Personal history of other diseases of the circulatory system: Secondary | ICD-10-CM | POA: Insufficient documentation

## 2013-04-11 DIAGNOSIS — K589 Irritable bowel syndrome without diarrhea: Secondary | ICD-10-CM | POA: Insufficient documentation

## 2013-04-11 DIAGNOSIS — Z87891 Personal history of nicotine dependence: Secondary | ICD-10-CM | POA: Insufficient documentation

## 2013-04-11 DIAGNOSIS — Z8719 Personal history of other diseases of the digestive system: Secondary | ICD-10-CM | POA: Insufficient documentation

## 2013-04-11 DIAGNOSIS — E785 Hyperlipidemia, unspecified: Secondary | ICD-10-CM | POA: Insufficient documentation

## 2013-04-11 DIAGNOSIS — Z79899 Other long term (current) drug therapy: Secondary | ICD-10-CM | POA: Insufficient documentation

## 2013-04-11 DIAGNOSIS — E039 Hypothyroidism, unspecified: Secondary | ICD-10-CM | POA: Insufficient documentation

## 2013-04-11 DIAGNOSIS — R0789 Other chest pain: Secondary | ICD-10-CM | POA: Insufficient documentation

## 2013-04-11 DIAGNOSIS — K219 Gastro-esophageal reflux disease without esophagitis: Secondary | ICD-10-CM | POA: Insufficient documentation

## 2013-04-11 DIAGNOSIS — M542 Cervicalgia: Secondary | ICD-10-CM | POA: Insufficient documentation

## 2013-04-11 LAB — BASIC METABOLIC PANEL
BUN: 9 mg/dL (ref 6–23)
Calcium: 9.3 mg/dL (ref 8.4–10.5)
Creatinine, Ser: 0.73 mg/dL (ref 0.50–1.10)
GFR calc Af Amer: >90 mL/min (ref >90)
GFR calc non Af Amer: 84 mL/min — ABNORMAL LOW (ref >90)
Glucose, Bld: 120 mg/dL — ABNORMAL HIGH (ref 70–99)

## 2013-04-11 LAB — CBC
HCT: 40.3 % (ref 36.0–46.0)
Hemoglobin: 13.9 g/dL (ref 12.0–15.0)
MCH: 31.2 pg (ref 26.0–34.0)
MCHC: 34.5 g/dL (ref 30.0–36.0)
RDW: 13.1 % (ref 11.5–15.5)

## 2013-04-11 MED ORDER — GI COCKTAIL ~~LOC~~
30.0000 mL | Freq: Once | ORAL | Status: AC
  Administered 2013-04-11: 30 mL via ORAL
  Filled 2013-04-11: qty 30

## 2013-04-11 MED ORDER — NITROGLYCERIN 2 % TD OINT
1.0000 [in_us] | TOPICAL_OINTMENT | Freq: Once | TRANSDERMAL | Status: DC
  Filled 2013-04-11: qty 1

## 2013-04-11 MED ORDER — PANTOPRAZOLE SODIUM 20 MG PO TBEC: 20.0000 mg | DELAYED_RELEASE_TABLET | Freq: Two times a day (BID) | ORAL | Status: DC

## 2013-04-11 MED ORDER — FAMOTIDINE IN NACL 20-0.9 MG/50ML-% IV SOLN
20.0000 mg | Freq: Once | INTRAVENOUS | Status: DC
  Filled 2013-04-11: qty 50

## 2013-04-11 MED ORDER — FAMOTIDINE 20 MG PO TABS
20.0000 mg | ORAL_TABLET | Freq: Once | ORAL | Status: AC
  Administered 2013-04-11: 20 mg via ORAL
  Filled 2013-04-11: qty 1

## 2013-04-11 NOTE — Telephone Encounter (Signed)
Patient Information:  Caller Name: Karel  Phone: [phone]  Patient: [name], [name]  Gender: Female  DOB: 1941/08/09  Age: 72 Years  PCP: Sanda Linger (Adults only)  Office Follow Up:  Does the office need to follow up with this patient?: No  Instructions For The Office: N/A   Symptoms  Reason For Call & Symptoms: Pt woke up having chest discomfort. She has been doing gardening and feels she may have pulled something while gardening. She is having chest pain over her breast into the back under her shoulder blade. Pt occasionally is feeling like the left side of her face is not getting any blood supply. Pain started at 6am and has been intermittent. Pt denies SOB.  Reviewed Health History In EMR: Yes  Reviewed Medications In EMR: Yes  Reviewed Allergies In EMR: Yes  Reviewed Surgeries / Procedures: Yes  Date of Onset of Symptoms: 04/11/2013  Guideline(s) Used:  Chest Pain  Disposition Per Guideline:   Go to ED Now  Reason For Disposition Reached:   Pain also present in shoulder(s) or arm(s) or jaw  Advice Given:  N/A  Patient Will Follow Care Advice:  YES

## 2013-04-11 NOTE — ED Notes (Signed)
Pt transported to xray 

## 2013-04-11 NOTE — ED Notes (Signed)
Per pt sts she woke up this am with left sided chest pain radiating into her back and neck. sts left side of her face feel funny. Denies SOB, N,V, cough.

## 2013-04-11 NOTE — ED Notes (Signed)
Discharge instructions reviewed with pt. Pt verbalized understanding.   

## 2013-04-11 NOTE — ED Provider Notes (Signed)
History     CSN: [id number]  Arrival date & time 04/11/13  1042   First MD Initiated Contact with Patient 04/11/13 1108      Chief Complaint  Patient presents with  . Chest Pain    (Consider location/radiation/quality/duration/timing/severity/associated sxs/prior treatment) HPI  Patient reports when she awakened at 5 AM which is her usual time to get up she had some chest discomfort and indicates the lower central area of her chest. She states the pain was described as aching and not burning. The pain does radiate to her back and she feels like the left side of her face feels tight like she is not getting enough circulation. She however has had this sensation before. She also states she had some discomfort in her left neck that she attributed to do yard work last week. She states she's also had this chest pain before with panic attacks or a GI etiology. She denies eating anything different yesterday or today. She states she takes magnesium to prevent abnormal feelings in her face and in her extremities. She states today she has not had nausea, vomiting, diaphoresis, shortness of breath, numbness of her extremities or face. She states the pain has been constant and waxes and wanes. She states nothing makes it feel better, nothing makes it feel worse however she states her pain at its worst today was an 8/10 and after taking Xanax and aspirin her pain improved to a 4/10. She denies any change in her diet or activity other than doing a lot of yard work last week.   Patient has been evaluated for this same chest pain in the past. She sees Dr. Huston Foley of [facility] cardiology. She states she was told her pain was GI in origin. She has had stress tests that were normal. She also reports she recently had an ultrasound which I thought she implied was of her abdomen.  Patient states her father died of an MI at age 79, she states her mother died at age 44 without heart disease.  PCP Dr Leitha Schuller [facility] Dr Antonieta Pert Cardiology Dr Lovina Reach, last stress test 2 years ago GI Dr Jarold Motto  Past Medical History  Diagnosis Date  . CHEST PAIN 04/30/2009  . COMMON MIGRAINE 04/30/2009  . CONSTIPATION 04/30/2009  . ESOPHAGEAL STRICTURE 04/30/2009  . GERD 04/30/2009  . HIATAL HERNIA 04/30/2009  . HYPOTHYROIDISM 04/30/2009  . Irritable bowel syndrome 04/30/2009  . RAYNAUD'S DISEASE 04/30/2009  . Hyperlipidemia     Past Surgical History  Procedure Laterality Date  . Abdominal hysterectomy  1976  . Tonsillectomy  1990  . Ectopic pregnancy surgery  1970  . Rhinoplasty  1980  . Foot surgury  1997  . Colonoscopy    . Upper gastrointestinal endoscopy      Family History  Problem Relation Age of Onset  . Heart disease Father   . Cancer Sister     breast cancer  . Diabetes Maternal Aunt   . Diabetes Maternal Uncle   . Cancer Paternal Aunt     lung cancer  . Diabetes Maternal Grandmother   . Heart disease Paternal Grandmother   . Heart disease Paternal Grandfather   . Colon cancer Neg Hx     History  Substance Use Topics  . Smoking status: Former Smoker    Quit date: 10/27/1984  . Smokeless tobacco: Never Used     Comment: quit 1985  . Alcohol Use: No     Comment: 1-2 per week  lives alone Drinks wine occassionally  OB History   Grav Para Term Preterm Abortions TAB SAB Ect Mult Living                  Review of Systems  All other systems reviewed and are negative.    Allergies  Review of patient's allergies indicates no known allergies.  Home Medications   Current Outpatient Rx  Name  Route  Sig  Dispense  Refill  . ALPHA-LIPOIC ACID PO   Oral   Take 50 mg by mouth daily.           Marland Kitchen ALPRAZolam (XANAX) 0.25 MG tablet   Oral   Take 1 tablet (0.25 mg total) by mouth 3 (three) times daily as needed for sleep or anxiety.   65 tablet   5   . AMBULATORY NON FORMULARY MEDICATION      Triest-Progesterone/Testosterone/DHEA Takes as directed         .  ARMOUR THYROID 30 MG tablet      TAKE ONE TABLET BY MOUTH EVERY DAY   30 tablet   5   . Ascorbic Acid (VITAMIN C) 1000 MG tablet   Oral   Take 1,000 mg by mouth daily.           Marland Kitchen CALCIUM PO   Oral   Take by mouth. Takes 800 mg daily          . Cholecalciferol (VITAMIN D3) 5000 UNITS TABS   Oral   Take by mouth.           . clidinium-chlordiazePOXIDE (LIBRAX) 2.5-5 MG per capsule   Oral   Take 1 capsule by mouth 2 (two) times daily.   60 capsule   1   . Coenzyme Q10 (CO Q 10) 10 MG CAPS   Oral   Take by mouth daily.           . Cyanocobalamin (VITAMIN B-12 CR) 1500 MCG TBCR   Oral   Take by mouth daily.          Marland Kitchen GLYCINE PO   Oral   Take 1,000 mg by mouth at bedtime.           . Magnesium Chloride (MAGNESIUM DR PO)   Oral   Take 1,000 mg by mouth.          . Magnesium Hydroxide (MILK OF MAGNESIA PO)   Oral   Take by mouth as needed.         . Multiple Vitamins-Minerals (OCUVITE PO)   Oral   Take by mouth daily.           . Omega-3 Fatty Acids (FISH OIL) 1000 MG CAPS   Oral   Take by mouth daily.           . pantoprazole (PROTONIX) 40 MG tablet   Oral   Take 1 tablet (40 mg total) by mouth 2 (two) times daily.   60 tablet   11   . psyllium (METAMUCIL) 58.6 % powder   Oral   Take 1 packet by mouth 3 (three) times daily. Mix 1 tbsp with juice          . vitamin A (RA VITAMIN A) 10000 UNIT capsule   Oral   Take 10,000 Units by mouth daily.           . Zinc 25 MG TABS   Oral   Take by mouth daily.  Aspirin 162 mg at bedtime  BP 124/71  Pulse 84  Temp(Src) 98.2 F (36.8 C)  Resp 14  SpO2 100%  Vital signs normal    Physical Exam  Nursing note and vitals reviewed. Constitutional: She is oriented to person, place, and time. She appears well-developed and well-nourished.  Non-toxic appearance. She does not appear ill. No distress.  HENT:  Head: Normocephalic and atraumatic.  Right Ear: External ear  normal.  Left Ear: External ear normal.  Nose: Nose normal. No mucosal edema or rhinorrhea.  Mouth/Throat: Oropharynx is clear and moist and mucous membranes are normal. No dental abscesses or edematous.  Eyes: Conjunctivae and EOM are normal. Pupils are equal, round, and reactive to light.  Neck: Normal range of motion and full passive range of motion without pain. Neck supple.  Cardiovascular: Normal rate, regular rhythm and normal heart sounds.  Exam reveals no gallop and no friction rub.   No murmur heard. Pulmonary/Chest: Effort normal and breath sounds normal. No respiratory distress. She has no wheezes. She has no rhonchi. She has no rales. She exhibits no tenderness and no crepitus.    Area of pain noted  Abdominal: Soft. Normal appearance and bowel sounds are normal. She exhibits no distension. There is no tenderness. There is no rebound and no guarding.  Musculoskeletal: Normal range of motion. She exhibits no edema and no tenderness.  Moves all extremities well.   Neurological: She is alert and oriented to person, place, and time. She has normal strength. No cranial nerve deficit.  Skin: Skin is warm, dry and intact. No rash noted. No erythema. No pallor.  Psychiatric: She has a normal mood and affect. Her speech is normal and behavior is normal. Her mood appears not anxious.    ED Course  Procedures (including critical care time) Medications  famotidine (PEPCID) tablet 20 mg (not administered)  gi cocktail (Maalox,Lidocaine,Donnatal) (30 mLs Oral Given 04/11/13 1257)   Recheck 12:58, pt hasn't gotten the medications that were ordered (IV pepcid, NTG paste, GI cocktail). States she is feeling better. Changed to oral pepcid and GI cocktail. Discussed increasing her protonix for short while.     Results for orders placed during the hospital encounter of 04/11/13  CBC      Result Value Range   WBC 7.2  4.0 - 10.5 K/uL   RBC 4.46  3.87 - 5.11 MIL/uL   Hemoglobin 13.9  12.0 -  15.0 g/dL   HCT 16.1  09.6 - 04.5 %   MCV 90.4  78.0 - 100.0 fL   MCH 31.2  26.0 - 34.0 pg   MCHC 34.5  30.0 - 36.0 g/dL   RDW 40.9  81.1 - 91.4 %   Platelets 236  150 - 400 K/uL  BASIC METABOLIC PANEL      Result Value Range   Sodium 139  135 - 145 mEq/L   Potassium 4.6  3.5 - 5.1 mEq/L   Chloride 103  96 - 112 mEq/L   CO2 24  19 - 32 mEq/L   Glucose, Bld 120 (*) 70 - 99 mg/dL   BUN 9  6 - 23 mg/dL   Creatinine, Ser 7.82  0.50 - 1.10 mg/dL   Calcium 9.3  8.4 - 95.6 mg/dL   GFR calc non Af Amer 84 (*) >90 mL/min   GFR calc Af Amer >90  >90 mL/min  POCT I-STAT TROPONIN I      Result Value Range   Troponin i, poc 0.00  0.00 - 0.08 ng/mL   Comment 3            Laboratory interpretation all normal except hyperglycemia   Dg Chest 2 View  04/11/2013   *RADIOLOGY REPORT*  Clinical Data: Left-sided chest pain.  Left shoulder and left-sided neck pain.  CHEST - 2 VIEW  Comparison: 04/26/2011  Findings: Heart size and pulmonary vascularity are normal and the lungs are clear.  No acute osseous abnormality.  No effusions.  IMPRESSION: No acute disease.   Original Report Authenticated By: Francene Boyers, M.D.   US Venous Img Lower Bilateral  03/21/2013   IMPRESSION: No evidence of deep vein thrombosis in either lower extremity.   Original Report Authenticated By: Judie Petit. Miles Costain, M.D.   US Arterial Seg Multiple  03/23/2013     IMPRESSION: No definite evidence of vasoocclusive disease within either lower extremity.   Original Report Authenticated By: Tacey Ruiz, MD      Date: 04/11/2013  Rate: 85  Rhythm: normal sinus rhythm  QRS Axis: normal  Intervals: normal  ST/T Wave abnormalities: normal  Conduction Disutrbances:none  Narrative Interpretation: Q waves septally  Old EKG Reviewed: unchanged from 04/26/11     1. Atypical chest pain     New Prescriptions   PANTOPRAZOLE (PROTONIX) 20 MG TABLET    Take 1 tablet (20 mg total) by mouth 2 (two) times daily.   Plan discharge  Devoria Albe, MD, FACEP    MDM          Ward Givens, MD 04/11/13 [phone]

## 2013-05-04 ENCOUNTER — Other Ambulatory Visit: Payer: Medicare Other

## 2013-05-04 ENCOUNTER — Ambulatory Visit (INDEPENDENT_AMBULATORY_CARE_PROVIDER_SITE_OTHER): Payer: Medicare Other | Admitting: Internal Medicine

## 2013-05-04 ENCOUNTER — Encounter: Payer: Self-pay | Admitting: Internal Medicine

## 2013-05-04 VITALS — BP 118/82 | HR 83 | Temp 98.3°F | Ht 64.0 in | Wt 120.0 lb

## 2013-05-04 DIAGNOSIS — J309 Allergic rhinitis, unspecified: Secondary | ICD-10-CM

## 2013-05-04 DIAGNOSIS — J029 Acute pharyngitis, unspecified: Secondary | ICD-10-CM

## 2013-05-04 NOTE — Addendum Note (Signed)
Addended by: Carin Primrose on: 05/04/2013 02:16 PM   Modules accepted: Orders

## 2013-05-04 NOTE — Progress Notes (Signed)
HPI  Pt presents to the clinic today with c/o cold symptoms x 2 weeks. She did have a tick bite around this same time. The worst part is the nasal congestion, sore throat and dry cough. She does not produce any sputum. She has tried salt water gargles and Vitamin C without relief. She denies fever,chills or body aches. She did go to Urgent Care for the same. They gave her Doxycycline for prophylaxis. She has no history of allergies or asthma that she is aware of. She has not had sick contacts but she does hang out with her granddaughters.  Review of Systems      Past Medical History  Diagnosis Date  . CHEST PAIN 04/30/2009  . COMMON MIGRAINE 04/30/2009  . CONSTIPATION 04/30/2009  . ESOPHAGEAL STRICTURE 04/30/2009  . GERD 04/30/2009  . HIATAL HERNIA 04/30/2009  . HYPOTHYROIDISM 04/30/2009  . Irritable bowel syndrome 04/30/2009  . RAYNAUD'S DISEASE 04/30/2009  . Hyperlipidemia     Family History  Problem Relation Age of Onset  . Heart disease Father   . Cancer Sister     breast cancer  . Diabetes Maternal Aunt   . Diabetes Maternal Uncle   . Cancer Paternal Aunt     lung cancer  . Diabetes Maternal Grandmother   . Heart disease Paternal Grandmother   . Heart disease Paternal Grandfather   . Colon cancer Neg Hx     History   Social History  . Marital Status: Married    Spouse Name: N/A    Number of Children: 2  . Years of Education: N/A   Occupational History  . Nature conservation officer    Social History Main Topics  . Smoking status: Former Smoker    Quit date: 10/27/1984  . Smokeless tobacco: Never Used     Comment: quit 1985  . Alcohol Use: No     Comment: 1-2 per week  . Drug Use: No  . Sexually Active: Not Currently    Birth Control/ Protection: Post-menopausal   Other Topics Concern  . Not on file   Social History Narrative   Patients gets regular exercise    No Known Allergies   Constitutional: Positive fatigue. Denies headache, fever or abrupt weight changes.   HEENT:  Positive sore throat. Denies eye redness, eye pain, pressure behind the eyes, facial pain, nasal congestion, ear pain, ringing in the ears, wax buildup, runny nose or bloody nose. Respiratory: Positive cough. Denies difficulty breathing or shortness of breath.  Cardiovascular: Denies chest pain, chest tightness, palpitations or swelling in the hands or feet.   No other specific complaints in a complete review of systems (except as listed in HPI above).  Objective:   BP 118/82  Pulse 83  Temp(Src) 98.3 F (36.8 C) (Oral)  Ht 5\' 4"  (1.626 m)  Wt 120 lb (54.432 kg)  BMI 20.59 kg/m2  SpO2 97% Wt Readings from Last 3 Encounters:  05/04/13 120 lb (54.432 kg)  02/22/13 119 lb (53.978 kg)  09/15/12 114 lb (51.71 kg)     General: Appears her stated age, well developed, well nourished in NAD. HEENT: Head: normal shape and size; Eyes: sclera white, no icterus, conjunctiva pink, PERRLA and EOMs intact; Ears: Tm's gray and intact, normal light reflex; Nose: mucosa pink and moist, septum midline; Throat/Mouth: + PND. Teeth present, mucosa erythematous and moist, no exudate noted, no lesions or ulcerations noted.  Neck: Mild tonsillar lymphadenopathy. Neck supple, trachea midline. No massses, lumps or thyromegaly present.  Cardiovascular: Normal  rate and rhythm. S1,S2 noted.  No murmur, rubs or gallops noted. No JVD or BLE edema. No carotid bruits noted. Pulmonary/Chest: Normal effort and positive vesicular breath sounds. No respiratory distress. No wheezes, rales or ronchi noted.      Assessment & Plan:   Allergic Rhinitis, new onset:  Get some rest and drink plenty of water Do salt water gargles for the sore throat Continue Doxycycline for tick borne illness prophylaxis Will due strep test and throat culture Get an OTC antihistamine such as Allegra to take daily  RTC as needed or if symptoms persist.

## 2013-05-04 NOTE — Patient Instructions (Signed)
Viral Pharyngitis Viral pharyngitis is a viral infection that produces redness, pain, and swelling (inflammation) of the throat. It can spread from person to person (contagious). CAUSES Viral pharyngitis is caused by inhaling a large amount of certain germs called viruses. Many different viruses cause viral pharyngitis. SYMPTOMS Symptoms of viral pharyngitis include:  Sore throat.  Tiredness.  Stuffy nose.  Low-grade fever.  Congestion.  Cough. TREATMENT Treatment includes rest, drinking plenty of fluids, and the use of over-the-counter medication (approved by your caregiver). HOME CARE INSTRUCTIONS   Drink enough fluids to keep your urine clear or pale yellow.  Eat soft, cold foods such as ice cream, frozen ice pops, or gelatin dessert.  Gargle with warm salt water (1 tsp salt per 1 qt of water).  If over age 7, throat lozenges may be used safely.  Only take over-the-counter or prescription medicines for pain, discomfort, or fever as directed by your caregiver. Do not take aspirin. To help prevent spreading viral pharyngitis to others, avoid:  Mouth-to-mouth contact with others.  Sharing utensils for eating and drinking.  Coughing around others. SEEK MEDICAL CARE IF:   You are better in a few days, then become worse.  You have a fever or pain not helped by pain medicines.  There are any other changes that concern you. Document Released: 08/18/2005 Document Revised: 01/31/2012 Document Reviewed: 01/14/2011 Exit[employer] Patient Information 2014 [employer].  

## 2013-06-22 ENCOUNTER — Encounter: Payer: Medicare Other | Admitting: Internal Medicine

## 2013-07-11 ENCOUNTER — Other Ambulatory Visit: Payer: Self-pay

## 2013-07-11 DIAGNOSIS — Z1231 Encounter for screening mammogram for malignant neoplasm of breast: Secondary | ICD-10-CM

## 2013-08-01 ENCOUNTER — Ambulatory Visit
Admission: RE | Admit: 2013-08-01 | Discharge: 2013-08-01 | Disposition: A | Discharge status: Home / Self Care | Payer: Medicare Other | Source: Ambulatory Visit

## 2013-08-01 DIAGNOSIS — Z1231 Encounter for screening mammogram for malignant neoplasm of breast: Secondary | ICD-10-CM

## 2013-08-01 LAB — HM MAMMOGRAPHY: HM Mammogram: NORMAL

## 2013-08-20 ENCOUNTER — Ambulatory Visit (INDEPENDENT_AMBULATORY_CARE_PROVIDER_SITE_OTHER): Payer: Medicare Other | Admitting: Internal Medicine

## 2013-08-20 ENCOUNTER — Encounter: Payer: Self-pay | Admitting: Internal Medicine

## 2013-08-20 VITALS — BP 140/80 | HR 75 | Ht 60.0 in | Wt 122.0 lb

## 2013-08-20 DIAGNOSIS — R0989 Other specified symptoms and signs involving the circulatory and respiratory systems: Secondary | ICD-10-CM

## 2013-08-20 DIAGNOSIS — F410 Panic disorder [episodic paroxysmal anxiety] without agoraphobia: Secondary | ICD-10-CM

## 2013-08-20 MED ORDER — NITROGLYCERIN 0.4 MG SL SUBL: 0.4000 mg | SUBLINGUAL_TABLET | SUBLINGUAL | Status: DC | PRN

## 2013-08-20 NOTE — Progress Notes (Signed)
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   In ER in May 2014  Went to ER that day  EKG done  [place] home Since then has had 2 episodes of tightness in neck  Then shoulder  Chest feels heavy.  Aug woke with heaviness in chest  Not severe  Not stressed  Exercises every day  Treadmill 5 out of 7 daysl  She just got back from [facility]  Very active while there  No Problems  No Known Allergies  Current Outpatient Prescriptions  Medication Sig Dispense Refill  . ALPHA-LIPOIC ACID PO Take 50 mg by mouth daily.        Marland Kitchen ALPRAZolam (XANAX) 0.25 MG tablet Take 1 tablet (0.25 mg total) by mouth 3 (three) times daily as needed for sleep or anxiety.  65 tablet  5  . AMBULATORY NON FORMULARY MEDICATION Triest-Progesterone/Testosterone/DHEA Takes as directed      . ARMOUR THYROID 30 MG tablet TAKE ONE TABLET BY MOUTH EVERY DAY  30 tablet  5  . Ascorbic Acid (VITAMIN C) 1000 MG tablet Take 1,000 mg by mouth daily.        Marland Kitchen CALCIUM PO Take by mouth. Takes 800 mg daily       . Cholecalciferol (VITAMIN D3) 5000 UNITS TABS Take by mouth.        . Coenzyme Q10 (CO Q 10) 10 MG CAPS Take by mouth daily.        . Cyanocobalamin (VITAMIN B-12 CR) 1500 MCG TBCR Take by mouth daily.       Marland Kitchen GLYCINE PO Take 1,000 mg by mouth at bedtime.        . Magnesium Chloride (MAGNESIUM DR PO) Take 1,000 mg by mouth.       . Magnesium Hydroxide (MILK OF MAGNESIA PO) Take by mouth as needed.      . Multiple Vitamins-Minerals (OCUVITE PO) Take by mouth daily.        . Omega-3 Fatty Acids (FISH OIL) 1000 MG CAPS Take by mouth daily.        . pantoprazole (PROTONIX) 40 MG tablet Take 1 tablet (40 mg total) by mouth 2 (two) times daily.  60 tablet  11  . psyllium (METAMUCIL) 58.6 % powder Take 1 packet by mouth 3 (three) times daily. Mix 1 tbsp with juice        . vitamin A (RA VITAMIN A) 10000 UNIT capsule Take 10,000 Units by mouth daily.        . Zinc 25 MG TABS Take by mouth daily.         No current facility-administered medications for this visit.    Past Medical History  Diagnosis Date  . CHEST PAIN 04/30/2009  . COMMON MIGRAINE 04/30/2009  . CONSTIPATION 04/30/2009  . ESOPHAGEAL STRICTURE 04/30/2009  . GERD 04/30/2009  . HIATAL HERNIA 04/30/2009  . HYPOTHYROIDISM 04/30/2009  . Irritable bowel syndrome 04/30/2009  . RAYNAUD'S DISEASE 04/30/2009  . Hyperlipidemia     Past Surgical History  Procedure Laterality Date  . Abdominal hysterectomy  1976  . Tonsillectomy  1990  . Ectopic pregnancy surgery  1970  . Rhinoplasty  1980  . Foot surgury  1997  . Colonoscopy    . Upper gastrointestinal endoscopy  Family History  Problem Relation Age of Onset  . Heart disease Father   . Cancer Sister     breast cancer  . Diabetes Maternal Aunt   . Diabetes Maternal Uncle   . Cancer Paternal Aunt     lung cancer  . Diabetes Maternal Grandmother   . Heart disease Paternal Grandmother   . Heart disease Paternal Grandfather   . Colon cancer Neg Hx     History   Social History  . Marital Status: Married    Spouse Name: N/A    Number of Children: 2  . Years of Education: N/A   Occupational History  . Nature conservation officer    Social History Main Topics  . Smoking status: Former Smoker    Quit date: 10/27/1984  . Smokeless tobacco: Never Used     Comment: quit 1985  . Alcohol Use: No     Comment: 1-2 per week  . Drug Use: No  . Sexual Activity: Not Currently    Birth Control/ Protection: Post-menopausal   Other Topics Concern  . Not on file   Social History Narrative   Patients gets regular exercise    Review of Systems:  All systems reviewed.  They are negative to the above problem except as previously stated.  Vital Signs: BP 140/80  Pulse 75  Ht 5' (1.524 m)  Wt 122 lb (55.339 kg)  BMI 23.83 kg/m2  Physical Exam Patient  is in NAD HEENT:  Normocephalic, atraumatic. EOMI, PERRLA.  Neck: JVP is normal.  No bruits.  Lungs: clear to auscultation. No rales no wheezes.  Heart: Regular rate and rhythm. Normal S1, S2. No S3.   No significant murmurs. PMI not displaced.  Abdomen:  Supple, nontender. Normal bowel sounds. No masses. No hepatomegaly.  Extremities:   Good distal pulses throughout. No lower extremity edema.  Musculoskeletal :moving all extremities.  Neuro:   alert and oriented x3.  CN II-XII grossly intact.  EKG  SR 75  Spetal infarct Assessment and Plan:  1.  CP  Atypical  Question GI  Would take protonix at night NTG given if develops CP again  Told her it would help for many kinds of pain  2.  ? Bruit  Set up for carotid USN

## 2013-08-20 NOTE — Patient Instructions (Addendum)
Your physician has requested that you have a carotid duplex. This test is an ultrasound of the carotid arteries in your neck. It looks at blood flow through these arteries that supply the brain with blood. Allow one hour for this exam. There are no restrictions or special instructions.  Your physician has recommended you make the following change in your medication: start taking Nitroglycerin for you chest pain. Please follow instructions given to you at todays visit and on medication bottle. Also, Please take Protonix at bedtime.  Your physician wants you to follow-up in: 1 year. You will receive a reminder letter in the mail two months in advance. If you don't receive a letter, please call our office to schedule the follow-up appointment.

## 2013-09-03 ENCOUNTER — Ambulatory Visit (HOSPITAL_COMMUNITY): Payer: Medicare Other | Attending: Cardiology

## 2013-09-03 DIAGNOSIS — R0989 Other specified symptoms and signs involving the circulatory and respiratory systems: Secondary | ICD-10-CM | POA: Insufficient documentation

## 2013-09-03 DIAGNOSIS — Z8673 Personal history of transient ischemic attack (TIA), and cerebral infarction without residual deficits: Secondary | ICD-10-CM | POA: Insufficient documentation

## 2013-09-03 DIAGNOSIS — E785 Hyperlipidemia, unspecified: Secondary | ICD-10-CM | POA: Insufficient documentation

## 2013-09-19 ENCOUNTER — Ambulatory Visit (INDEPENDENT_AMBULATORY_CARE_PROVIDER_SITE_OTHER): Payer: Medicare Other

## 2013-09-19 DIAGNOSIS — Z23 Encounter for immunization: Secondary | ICD-10-CM

## 2014-04-05 ENCOUNTER — Ambulatory Visit (INDEPENDENT_AMBULATORY_CARE_PROVIDER_SITE_OTHER): Payer: Medicare Other | Admitting: Physician Assistant

## 2014-04-05 ENCOUNTER — Encounter: Payer: Self-pay | Admitting: *Deleted

## 2014-04-05 ENCOUNTER — Encounter: Payer: Self-pay | Admitting: Physician Assistant

## 2014-04-05 ENCOUNTER — Ambulatory Visit
Admission: RE | Admit: 2014-04-05 | Discharge: 2014-04-05 | Disposition: A | Discharge status: Home / Self Care | Payer: Medicare Other | Source: Ambulatory Visit | Attending: Physician Assistant | Admitting: Physician Assistant

## 2014-04-05 VITALS — BP 142/72 | HR 73 | Ht 60.0 in

## 2014-04-05 DIAGNOSIS — K219 Gastro-esophageal reflux disease without esophagitis: Secondary | ICD-10-CM

## 2014-04-05 DIAGNOSIS — E039 Hypothyroidism, unspecified: Secondary | ICD-10-CM

## 2014-04-05 DIAGNOSIS — E7800 Pure hypercholesterolemia, unspecified: Secondary | ICD-10-CM

## 2014-04-05 DIAGNOSIS — R079 Chest pain, unspecified: Secondary | ICD-10-CM

## 2014-04-05 LAB — CBC WITH DIFFERENTIAL/PLATELET
BASOS ABS: 0 10*3/uL (ref 0.0–0.1)
Basophils Relative: 0.5 % (ref 0.0–3.0)
EOS PCT: 0.4 % (ref 0.0–5.0)
Eosinophils Absolute: 0 10*3/uL (ref 0.0–0.7)
HCT: 41.7 % (ref 36.0–46.0)
Hemoglobin: 14.1 g/dL (ref 12.0–15.0)
Lymphocytes Relative: 23.8 % (ref 12.0–46.0)
Lymphs Abs: 1.1 10*3/uL (ref 0.7–4.0)
MCHC: 33.9 g/dL (ref 30.0–36.0)
MCV: 92.2 fl (ref 78.0–100.0)
MONO ABS: 0.3 10*3/uL (ref 0.1–1.0)
MONOS PCT: 6.7 % (ref 3.0–12.0)
NEUTROS PCT: 68.6 % (ref 43.0–77.0)
Neutro Abs: 3.2 10*3/uL (ref 1.4–7.7)
PLATELETS: 218 10*3/uL (ref 150.0–400.0)
RBC: 4.52 Mil/uL (ref 3.87–5.11)
RDW: 13.5 % (ref 11.5–15.5)
WBC: 4.7 10*3/uL (ref 4.0–10.5)

## 2014-04-05 LAB — TROPONIN I

## 2014-04-05 LAB — BASIC METABOLIC PANEL
BUN: 12 mg/dL (ref 6–23)
CO2: 27 mEq/L (ref 19–32)
CREATININE: 0.8 mg/dL (ref 0.4–1.2)
Calcium: 9.3 mg/dL (ref 8.4–10.5)
Chloride: 106 mEq/L (ref 96–112)
GFR: 72.79 mL/min (ref >60.00)
Glucose, Bld: 96 mg/dL (ref 70–99)
Potassium: 4.4 mEq/L (ref 3.5–5.1)
Sodium: 141 mEq/L (ref 135–145)

## 2014-04-05 MED ORDER — RANITIDINE HCL 150 MG PO CAPS
150.0000 mg | ORAL_CAPSULE | Freq: Every evening | ORAL | Status: DC
Start: 2014-04-05 — End: 2015-04-09

## 2014-04-05 NOTE — Patient Instructions (Signed)
Your physician recommends that you schedule a follow-up appointment in: [place]  Your physician recommends that you HAVE LAB WORK TODAY  Your physician has requested that you have en exercise stress myoview. For further information please visit [email or web address]. Please follow instruction sheet, as given.  A chest x-ray takes a picture of the organs and structures inside the chest, including the heart, lungs, and blood vessels. This test can show several things, including, whether the heart is enlarges; whether fluid is building up in the lungs; and whether pacemaker / defibrillator leads are still in place.[place]

## 2014-04-05 NOTE — Progress Notes (Signed)
[facility], [address] [place], [place]  [address] Phone: [phone] Fax:  [phone]  Date:  04/05/2014   ID:  [name], Little 1941/09/01, MRN [id number]  PCP:  Scarlette Calico, MD  Cardiologist:  Dr. Dorris Carnes     History of Present Illness: [name] is a 73 y.o. female with a history of chest pain, GERD, esophageal stricture status post dilatation in 2011, hypothyroidism, prior TIA. Last seen by Dr. Harrington Challenger 07/2013.  She presents to the office today for evaluation of chest pain. Symptoms have been ongoing for the past week. They have been constant. She describes a pressure in her left chest. She notes decreased energy. She denies exertional chest discomfort. She denies dyspnea. She denies syncope. She denies pleuritic chest pain. She denies any worsening chest pain with lying flat. She denies orthopnea, PND or edema.   Studies:  - Nuclear (04/2011):  No ischemia, EF 101% (over  - Carotid US (08/2013):  Normal carotids bilat.  - ABI (03/2013):  Normal   Recent Labs: 04/11/2013: Creatinine 0.73; Hemoglobin 13.9; Potassium 4.6   Wt Readings from Last 3 Encounters:  08/20/13 122 lb (55.339 kg)  05/04/13 120 lb (54.432 kg)  02/22/13 119 lb (53.978 kg)     Past Medical History  Diagnosis Date  . CHEST PAIN 04/30/2009  . COMMON MIGRAINE 04/30/2009  . CONSTIPATION 04/30/2009  . ESOPHAGEAL STRICTURE 04/30/2009  . GERD 04/30/2009  . HIATAL HERNIA 04/30/2009  . HYPOTHYROIDISM 04/30/2009  . Irritable bowel syndrome 04/30/2009  . RAYNAUD'S DISEASE 04/30/2009  . Hyperlipidemia     Current Outpatient Prescriptions  Medication Sig Dispense Refill  . ALPHA-LIPOIC ACID PO Take 50 mg by mouth daily.        Marland Kitchen ALPRAZolam (XANAX) 0.25 MG tablet Take 1 tablet (0.25 mg total) by mouth 3 (three) times daily as needed for sleep or anxiety.  65 tablet  5  . AMBULATORY NON FORMULARY MEDICATION Triest-Progesterone/Testosterone/DHEA Takes as directed      . ARMOUR THYROID 30 MG tablet TAKE ONE  TABLET BY MOUTH EVERY DAY  30 tablet  5  . Ascorbic Acid (VITAMIN C) 1000 MG tablet Take 1,000 mg by mouth daily.        Marland Kitchen CALCIUM PO Take by mouth. Takes 800 mg daily       . Cholecalciferol (VITAMIN D3) 5000 UNITS TABS Take by mouth.        . Coenzyme Q10 (CO Q 10) 10 MG CAPS Take by mouth daily.        . Cyanocobalamin (VITAMIN B-12 CR) 1500 MCG TBCR Take by mouth daily.       . Magnesium Chloride (MAGNESIUM DR PO) Take 1,000 mg by mouth.       . Magnesium Hydroxide (MILK OF MAGNESIA PO) Take by mouth as needed.      . Multiple Vitamins-Minerals (OCUVITE PO) Take by mouth daily.        . nitroGLYCERIN (NITROSTAT) 0.4 MG SL tablet Place 1 tablet (0.4 mg total) under the tongue every 5 (five) minutes as needed for chest pain.  25 tablet  3  . Omega-3 Fatty Acids (FISH OIL) 1000 MG CAPS Take by mouth daily.        . pantoprazole (PROTONIX) 40 MG tablet Take 1 tablet (40 mg total) by mouth 2 (two) times daily.  60 tablet  11  . psyllium (METAMUCIL) 58.6 % powder Take 1 packet by mouth 3 (three) times daily. Mix 1 tbsp  with juice       . vitamin A (RA VITAMIN A) 10000 UNIT capsule Take 10,000 Units by mouth daily.        . Zinc 25 MG TABS Take by mouth daily.         No current facility-administered medications for this visit.    Allergies:   Review of patient's allergies indicates no known allergies.   Social History:  The patient  reports that she quit smoking about 29 years ago. She has never used smokeless tobacco. She reports that she does not drink alcohol or use illicit drugs.   Family History:  The patient's family history includes Cancer in her paternal aunt and sister; Diabetes in her maternal aunt, maternal grandmother, and maternal uncle; Heart disease in her father, paternal grandfather, and paternal grandmother. There is no history of Colon cancer.   ROS:  Please see the history of present illness.      All other systems reviewed and negative.   PHYSICAL EXAM: VS:  BP 142/72   Pulse 73  Ht 5' (1.524 m)  SpO2 91% Well nourished, well developed, in no acute distress HEENT: normal Neck: no JVD Cardiac:  normal S1, S2; RRR; no murmur Lungs:  clear to auscultation bilaterally, no wheezing, rhonchi or rales Abd: soft, nontender, no hepatomegaly Ext: no edema Skin: warm and dry Neuro:  CNs 2-12 intact, no focal abnormalities noted  EKG:  NSR, HR 73, normal axis, no ST changes     ASSESSMENT AND PLAN:  1. Chest pain, unspecified: Somewhat atypical symptoms. Her symptoms have been ongoing for the past several days. I will obtain a troponin, basic metabolic panel, CBC and chest x-ray. If her troponin is abnormal, she will be sent to the emergency room for further evaluation and admission. If her troponin returns normal, she will be set up for an ETT-Myoview next week to rule out ischemia. I will also add Zantac 150 mg each bedtime. If her Myoview is negative, she will likely need follow up with gastroenterology. 2. GERD:  Continue PPI.  Add H2RA.  If myoview low risk, f/u with GI. 3. HYPOTHYROIDISM:  Continue Armour Thyroid. 4. Disposition:  F/u with Dr. Dorris Carnes or me in 2 weeks.  Signed, Richardson Dopp, PA-C  04/05/2014 9:54 AM

## 2014-04-23 ENCOUNTER — Ambulatory Visit (INDEPENDENT_AMBULATORY_CARE_PROVIDER_SITE_OTHER): Payer: Medicare Other | Admitting: Physician Assistant

## 2014-04-23 ENCOUNTER — Encounter: Payer: Self-pay | Admitting: Physician Assistant

## 2014-04-23 ENCOUNTER — Ambulatory Visit (HOSPITAL_COMMUNITY): Payer: Medicare Other | Attending: Cardiology | Admitting: Radiology

## 2014-04-23 VITALS — BP 142/77 | HR 71 | Ht 60.0 in | Wt 122.0 lb

## 2014-04-23 VITALS — BP 110/70 | HR 72 | Ht 60.0 in | Wt 124.0 lb

## 2014-04-23 DIAGNOSIS — E039 Hypothyroidism, unspecified: Secondary | ICD-10-CM

## 2014-04-23 DIAGNOSIS — R5383 Other fatigue: Secondary | ICD-10-CM

## 2014-04-23 DIAGNOSIS — R079 Chest pain, unspecified: Secondary | ICD-10-CM

## 2014-04-23 DIAGNOSIS — K219 Gastro-esophageal reflux disease without esophagitis: Secondary | ICD-10-CM

## 2014-04-23 DIAGNOSIS — I2510 Atherosclerotic heart disease of native coronary artery without angina pectoris: Secondary | ICD-10-CM | POA: Insufficient documentation

## 2014-04-23 DIAGNOSIS — R5381 Other malaise: Secondary | ICD-10-CM | POA: Insufficient documentation

## 2014-04-23 MED ORDER — TECHNETIUM TC 99M SESTAMIBI GENERIC - CARDIOLITE
11.0000 | Freq: Once | INTRAVENOUS | Status: AC | PRN
Start: 2014-04-23 — End: 2014-04-23
  Administered 2014-04-23: 11 via INTRAVENOUS

## 2014-04-23 MED ORDER — TECHNETIUM TC 99M SESTAMIBI GENERIC - CARDIOLITE
33.0000 | Freq: Once | INTRAVENOUS | Status: AC | PRN
  Administered 2014-04-23: 33 via INTRAVENOUS

## 2014-04-23 NOTE — Patient Instructions (Signed)
Your physician wants you to follow-up in: 07/2015 WITH DR. Harrington Challenger.  You will receive a reminder letter in the mail two months in advance. If you don't receive a letter, please call our office to schedule the follow-up appointment.

## 2014-04-23 NOTE — Progress Notes (Signed)
[facility] [address] [place], [place] [address] [phone]    Cardiology Nuclear Med Study  [name] is a 73 y.o. female     MRN : [id number]     DOB: 1940-12-15  Procedure Date: 04/23/2014  Nuclear Med Background Indication for Stress Test:  Evaluation for Ischemia History:  No known CAD, MPI 2012 (normal) , hx. esph. dilation Cardiac Risk Factors: History of Smoking and TIA  Symptoms:  Chest Pain (last date of chest discomfort was this morning) and Fatigue   Nuclear Pre-Procedure Caffeine/Decaff Intake:  None NPO After: 7:00pm   Lungs:  clear O2 Sat: 96% on room air. IV 0.9% NS with Angio Cath:  22g  IV Site: R Hand  IV Started by:  Matilde Haymaker, RN  Chest Size (in):  32 Cup Size: D  Height: 5' (1.524 m)  Weight:  122 lb (55.339 kg)  BMI:  Body mass index is 23.83 kg/(m^2). Tech Comments:  n/a    Nuclear Med Study 1 or 2 day study: 1 day  Stress Test Type:  Stress  Reading MD: n/a  Order Authorizing Provider:  Nevin Bloodgood Ross,MD and [facility]  Resting Radionuclide: Technetium 32m Sestamibi  Resting Radionuclide Dose: 11.0 mCi   Stress Radionuclide:  Technetium 63m Sestamibi  Stress Radionuclide Dose: 33.0 mCi           Stress Protocol Rest HR: 71 Stress HR: 142  Rest BP: 142/77 Stress BP: 163/76  Exercise Time (min): 6:00 METS: 7.0           Dose of Adenosine (mg):  n/a Dose of Lexiscan: n/a mg  Dose of Atropine (mg): n/a Dose of Dobutamine: n/a mcg/kg/min (at max HR)  Stress Test Technologist: Glade Lloyd, BS-ES  Nuclear Technologist:  Charlton Amor, CNMT     Rest Procedure:  Myocardial perfusion imaging was performed at rest 45 minutes following the intravenous administration of Technetium 49m Sestamibi. Rest ECG: NSR - Normal EKG  Stress Procedure:  The patient exercised on the treadmill utilizing the Bruce Protocol for 6:00 minutes. The patient stopped due to fatigue and denied any chest pain.   Technetium 11m Sestamibi was injected at peak exercise and myocardial perfusion imaging was performed after a brief delay. Stress ECG: No significant change from baseline ECG  QPS Raw Data Images:  Normal; no motion artifact; normal heart/lung ratio. Stress Images:  Normal homogeneous uptake in all areas of the myocardium. Rest Images:  Normal homogeneous uptake in all areas of the myocardium. Subtraction (SDS):  Normal Transient Ischemic Dilatation (Normal <1.22):  0.86 Lung/Heart Ratio (Normal <0.45):  0.28  Quantitative Gated Spect Images QGS EDV:  51 ml QGS ESV:  8 ml  Impression Exercise Capacity:  Fair exercise capacity. BP Response:  Normal blood pressure response. Clinical Symptoms:  No significant symptoms noted. ECG Impression:  No significant ST segment change suggestive of ischemia. Comparison with Prior Nuclear Study: No images to compare  Overall Impression:  Normal stress nuclear study.  LV Ejection Fraction: 85%.  LV Wall Motion:  NL LV Function; NL Wall Motion `  [name]

## 2014-04-23 NOTE — Progress Notes (Signed)
Cardiology Office Note   Date:  04/23/2014   ID:  [name], Little [birth year], MRN [id number]  PCP:  [name] Calico, MD  Cardiologist:  Dr. Dorris Carnes     History of Present Illness: [name] is a 73 y.o. female with a history of chest pain, GERD, esophageal stricture status post dilatation in 2011, hypothyroidism, prior TIA. I saw her 04/05/14 for evaluation of chest pain.  Troponin was negative. CXR was unremarkable.  She was set up for a nuclear stress test which was done today.  She returns for follow up.  I had her start taking Zantac at bedtime.  This has helped. No further chest pain.  She has been exercising daily.  She denies dyspnea, orthopnea, PND, edema.  No syncope.    Studies:  - Nuclear (04/2011):  No ischemia, EF 101% (over  - Carotid US (08/2013):  Normal carotids bilat.  - ABI (03/2013):  Normal   Recent Labs: 04/05/2014: Creatinine 0.8; Hemoglobin 14.1; Potassium 4.4   Wt Readings from Last 3 Encounters:  04/23/14 124 lb (56.246 kg)  04/23/14 122 lb (55.339 kg)  08/20/13 122 lb (55.339 kg)     Past Medical History  Diagnosis Date  . CHEST PAIN 04/30/2009  . COMMON MIGRAINE 04/30/2009  . CONSTIPATION 04/30/2009  . ESOPHAGEAL STRICTURE 04/30/2009  . GERD 04/30/2009  . HIATAL HERNIA 04/30/2009  . HYPOTHYROIDISM 04/30/2009  . Irritable bowel syndrome 04/30/2009  . RAYNAUD'S DISEASE 04/30/2009  . Hyperlipidemia     Current Outpatient Prescriptions  Medication Sig Dispense Refill  . ALPHA-LIPOIC ACID PO Take 50 mg by mouth daily.       Marland Kitchen ALPRAZolam (XANAX) 0.25 MG tablet Take 1 tablet (0.25 mg total) by mouth 3 (three) times daily as needed for sleep or anxiety.  65 tablet  5  . AMBULATORY NON FORMULARY MEDICATION Triest-Progesterone/Testosterone/DHEA Takes as directed      . ARMOUR THYROID 30 MG tablet TAKE ONE TABLET BY MOUTH EVERY DAY  30 tablet  5  . Ascorbic Acid (VITAMIN C) 1000 MG tablet Take 1,000 mg by mouth daily.        Marland Kitchen CALCIUM PO Take by mouth.  Takes 800 mg daily       . Cholecalciferol (VITAMIN D3) 5000 UNITS TABS Take by mouth.        . Coenzyme Q10 (CO Q 10) 10 MG CAPS Take by mouth daily.        . Cyanocobalamin (VITAMIN B-12 CR) 1500 MCG TBCR Take by mouth daily.       . Magnesium Chloride (MAGNESIUM DR PO) Take 1,000 mg by mouth.       . Magnesium Hydroxide (MILK OF MAGNESIA PO) Take by mouth as needed.      . Multiple Vitamins-Minerals (OCUVITE PO) Take by mouth daily.        . nitroGLYCERIN (NITROSTAT) 0.4 MG SL tablet Place 1 tablet (0.4 mg total) under the tongue every 5 (five) minutes as needed for chest pain.  25 tablet  3  . Omega-3 Fatty Acids (FISH OIL) 1000 MG CAPS Take by mouth daily.        . pantoprazole (PROTONIX) 40 MG tablet Take 1 tablet (40 mg total) by mouth 2 (two) times daily.  60 tablet  11  . psyllium (METAMUCIL) 58.6 % powder Take 1 packet by mouth 3 (three) times daily. Mix 1 tbsp with juice       . ranitidine (ZANTAC) 150 MG capsule Take 1  capsule (150 mg total) by mouth every evening.      . vitamin A (RA VITAMIN A) 10000 UNIT capsule Take 10,000 Units by mouth daily.        . Zinc 25 MG TABS Take by mouth daily.         No current facility-administered medications for this visit.    Allergies:   Review of patient's allergies indicates no known allergies.   Social History:  The patient  reports that she quit smoking about 29 years ago. She has never used smokeless tobacco. She reports that she does not drink alcohol or use illicit drugs.   Family History:  The patient's family history includes Cancer in her paternal aunt and sister; Diabetes in her maternal aunt, maternal grandmother, and maternal uncle; Heart disease in her father, paternal grandfather, and paternal grandmother. There is no history of Colon cancer.   ROS:  Please see the history of present illness.      All other systems reviewed and negative.   PHYSICAL EXAM: VS:  BP 110/70  Pulse 72  Ht 5' (1.524 m)  Wt 124 lb (56.246 kg)   BMI 24.22 kg/m2 Well nourished, well developed, in no acute distress HEENT: normal Neck: no JVD Cardiac:  normal S1, S2; RRR; no murmur Lungs:  clear to auscultation bilaterally, no wheezing, rhonchi or rales Abd: soft, nontender, no hepatomegaly Ext: no edema Skin: warm and dry Neuro:  CNs 2-12 intact, no focal abnormalities noted      ASSESSMENT AND PLAN:  1. Chest pain, unspecified:   Official results from her myoview are not in the system yet.  I had Dr. Fransico Him ([facility]) read her Nuclear images for me and they are normal with EF 80%.  No further cardiac workup.   2. GERD:  Continue PPI.  Add H2RA. With myoview low risk, recommend she f/u with GI. 3. HYPOTHYROIDISM:  Continue Armour Thyroid. 4. Disposition:  F/u with Dr. Dorris Carnes in 1 year.   Signed, Versie Starks, MHS 04/23/2014 4:16 PM    [facility] [place], [place], [place]  [address] Phone: [phone]; Fax: [phone]

## 2014-04-23 NOTE — Addendum Note (Signed)
Addended by: Michae Kava on: 04/23/2014 04:46 PM   Modules accepted: Orders

## 2014-04-24 ENCOUNTER — Encounter: Payer: Self-pay | Admitting: Physician Assistant

## 2014-07-02 ENCOUNTER — Encounter: Payer: Self-pay | Admitting: Internal Medicine

## 2014-07-02 ENCOUNTER — Ambulatory Visit (INDEPENDENT_AMBULATORY_CARE_PROVIDER_SITE_OTHER): Payer: Medicare Other | Admitting: Internal Medicine

## 2014-07-02 ENCOUNTER — Other Ambulatory Visit (INDEPENDENT_AMBULATORY_CARE_PROVIDER_SITE_OTHER): Payer: Medicare Other

## 2014-07-02 VITALS — BP 146/76 | HR 76 | Temp 98.3°F | Resp 16 | Ht 60.0 in | Wt 125.0 lb

## 2014-07-02 DIAGNOSIS — IMO0001 Reserved for inherently not codable concepts without codable children: Secondary | ICD-10-CM

## 2014-07-02 DIAGNOSIS — E039 Hypothyroidism, unspecified: Secondary | ICD-10-CM

## 2014-07-02 DIAGNOSIS — R7309 Other abnormal glucose: Secondary | ICD-10-CM

## 2014-07-02 DIAGNOSIS — E7800 Pure hypercholesterolemia, unspecified: Secondary | ICD-10-CM

## 2014-07-02 DIAGNOSIS — K589 Irritable bowel syndrome without diarrhea: Secondary | ICD-10-CM

## 2014-07-02 DIAGNOSIS — R519 Headache, unspecified: Secondary | ICD-10-CM | POA: Insufficient documentation

## 2014-07-02 DIAGNOSIS — R51 Headache: Secondary | ICD-10-CM

## 2014-07-02 DIAGNOSIS — R209 Unspecified disturbances of skin sensation: Secondary | ICD-10-CM

## 2014-07-02 DIAGNOSIS — I7300 Raynaud's syndrome without gangrene: Secondary | ICD-10-CM

## 2014-07-02 DIAGNOSIS — K219 Gastro-esophageal reflux disease without esophagitis: Secondary | ICD-10-CM

## 2014-07-02 DIAGNOSIS — K222 Esophageal obstruction: Secondary | ICD-10-CM

## 2014-07-02 LAB — HEMOGLOBIN A1C: HEMOGLOBIN A1C: 5.5 % (ref 4.6–6.5)

## 2014-07-02 LAB — LIPID PANEL
Cholesterol: 212 mg/dL — ABNORMAL HIGH (ref 0–200)
HDL: 66 mg/dL (ref >39.00)
LDL Cholesterol: 135 mg/dL — ABNORMAL HIGH (ref 0–99)
NonHDL: 146
TRIGLYCERIDES: 54 mg/dL (ref 0.0–149.0)
Total CHOL/HDL Ratio: 3
VLDL: 10.8 mg/dL (ref 0.0–40.0)

## 2014-07-02 LAB — C-REACTIVE PROTEIN: CRP: <0.5 mg/dL (ref 0.5–20.0)

## 2014-07-02 LAB — SEDIMENTATION RATE: SED RATE: 10 mm/h (ref 0–22)

## 2014-07-02 LAB — TSH: TSH: 0.02 u[IU]/mL — ABNORMAL LOW (ref 0.35–4.50)

## 2014-07-02 NOTE — Patient Instructions (Signed)

## 2014-07-02 NOTE — Progress Notes (Signed)
Pre visit review using our clinic review tool, if applicable. No additional management support is needed unless otherwise documented below in the visit note. 

## 2014-07-02 NOTE — Progress Notes (Signed)
Subjective:    Patient ID: [name], female    DOB: Nov 07, 1941, 73 y.o.   MRN: [id number]  Headache  This is a recurrent problem. The current episode started 1 to 4 weeks ago. The problem occurs intermittently. The problem has been unchanged. The pain is located in the right unilateral and parietal region. The pain does not radiate. The pain quality is similar to prior headaches. The quality of the pain is described as aching. The pain is at a severity of 1/10. The pain is mild. Associated symptoms include numbness (left side of face, arms, and legs feel numb). Pertinent negatives include no abdominal pain, abnormal behavior, anorexia, back pain, blurred vision, coughing, dizziness, drainage, ear pain, eye pain, facial sweating, fever, hearing loss, insomnia, loss of balance, muscle aches, nausea, neck pain, phonophobia, photophobia, rhinorrhea, scalp tenderness, seizures, sinus pressure, sore throat, swollen glands, tingling, tinnitus, visual change, vomiting, weakness or weight loss. The symptoms are aggravated by emotional stress. She has tried acetaminophen for the symptoms. The treatment provided moderate relief. Her past medical history is significant for migraine headaches and migraines in the family. There is no history of cancer, cluster headaches, hypertension, immunosuppression, obesity, pseudotumor cerebri, recent head traumas, sinus disease or TMJ.      Review of Systems  Constitutional: Negative.  Negative for fever, chills, weight loss, diaphoresis, appetite change and fatigue.  HENT: Negative for ear pain, hearing loss, rhinorrhea, sinus pressure, sore throat and tinnitus.   Eyes: Negative.  Negative for blurred vision, photophobia and pain.  Respiratory: Negative.  Negative for cough, choking, chest tightness, shortness of breath and stridor.   Cardiovascular: Negative.  Negative for chest pain, palpitations and leg swelling.  Gastrointestinal: Negative.  Negative for  nausea, vomiting, abdominal pain, diarrhea, constipation, blood in stool and anorexia.  Endocrine: Negative.   Genitourinary: Negative.   Musculoskeletal: Negative.  Negative for arthralgias, back pain, gait problem, joint swelling, myalgias, neck pain and neck stiffness.  Skin: Negative.  Negative for rash.  Allergic/Immunologic: Negative.   Neurological: Positive for numbness (left side of face, arms, and legs feel numb) and headaches. Negative for dizziness, tingling, tremors, seizures, syncope, facial asymmetry, speech difficulty, weakness, light-headedness and loss of balance.  Hematological: Negative.  Negative for adenopathy. Does not bruise/bleed easily.  Psychiatric/Behavioral: Positive for sleep disturbance. Negative for suicidal ideas, hallucinations, behavioral problems, confusion, self-injury, dysphoric mood, decreased concentration and agitation. The patient is nervous/anxious. The patient does not have insomnia and is not hyperactive.        Objective:   Physical Exam  Vitals reviewed. Constitutional: She is oriented to person, place, and time. She appears well-developed and well-nourished. No distress.  HENT:  Head: Normocephalic and atraumatic.  Nose: Nose normal.  Mouth/Throat: Oropharynx is clear and moist.  Eyes: Conjunctivae and EOM are normal. Pupils are equal, round, and reactive to light. Right eye exhibits no discharge. Left eye exhibits no discharge. No scleral icterus.  Neck: Normal range of motion. Neck supple. No JVD present. No tracheal deviation present. No thyromegaly present.  Cardiovascular: Normal rate, regular rhythm, normal heart sounds and intact distal pulses.  Exam reveals no gallop and no friction rub.   No murmur heard. Pulmonary/Chest: Effort normal and breath sounds normal. No stridor. No respiratory distress. She has no wheezes. She has no rales. She exhibits no tenderness.  Abdominal: Soft. Bowel sounds are normal. She exhibits no distension and  no mass. There is no tenderness. There is no rebound and no guarding.  Musculoskeletal: Normal range of motion. She exhibits no edema and no tenderness.  Lymphadenopathy:    She has no cervical adenopathy.  Neurological: She is alert and oriented to person, place, and time. She has normal strength. She displays no atrophy, no tremor and normal reflexes. No cranial nerve deficit or sensory deficit. She exhibits normal muscle tone. She displays a negative Romberg sign. She displays no seizure activity. Coordination and gait normal. She displays no Babinski's sign on the right side. She displays no Babinski's sign on the left side.  Reflex Scores:      Tricep reflexes are 1+ on the right side and 1+ on the left side.      Bicep reflexes are 1+ on the right side and 1+ on the left side.      Brachioradialis reflexes are 1+ on the right side and 1+ on the left side.      Patellar reflexes are 1+ on the right side and 1+ on the left side.      Achilles reflexes are 1+ on the right side and 1+ on the left side. Skin: Skin is warm and dry. No rash noted. She is not diaphoretic. No erythema. No pallor.     Lab Results  Component Value Date   WBC 4.7 04/05/2014   HGB 14.1 04/05/2014   HCT 41.7 04/05/2014   PLT 218.0 04/05/2014   GLUCOSE 96 04/05/2014   CHOL 199 05/26/2012   TRIG 45.0 05/26/2012   HDL 73.40 05/26/2012   LDLDIRECT 130.0 08/03/2011   LDLCALC 117* 05/26/2012   ALT 17 05/26/2012   AST 20 05/26/2012   NA 141 04/05/2014   K 4.4 04/05/2014   CL 106 04/05/2014   CREATININE 0.8 04/05/2014   BUN 12 04/05/2014   CO2 27 04/05/2014   TSH 2.52 09/15/2012   HGBA1C 5.3 09/15/2012       Assessment & Plan:

## 2014-07-03 ENCOUNTER — Encounter: Payer: Self-pay | Admitting: Internal Medicine

## 2014-07-03 ENCOUNTER — Other Ambulatory Visit: Payer: Self-pay | Admitting: Internal Medicine

## 2014-07-03 DIAGNOSIS — E039 Hypothyroidism, unspecified: Secondary | ICD-10-CM

## 2014-07-03 MED ORDER — THYROID 15 MG PO TABS: 15.0000 mg | ORAL_TABLET | Freq: Every day | ORAL | Status: DC

## 2014-07-03 MED ORDER — THYROID 60 MG PO TABS: 60.0000 mg | ORAL_TABLET | Freq: Every day | ORAL | Status: DC

## 2014-07-03 NOTE — Assessment & Plan Note (Signed)
Her exam today is normal She has a prior history of TIA I have ordered an MRI to see if there has been a new event

## 2014-07-03 NOTE — Assessment & Plan Note (Signed)
I think she has a tension-type headache, she will cont the current meds for pain relief I will check her labs today to see if there is concern for vasculitis, anemia, thyroid disease, etc I have also ordered an MRI to see if there is a new CVA, mass, bleed, etc.

## 2014-07-03 NOTE — Assessment & Plan Note (Signed)
Her TSH is suppressed so I will lower her thyroid dose

## 2014-07-12 ENCOUNTER — Ambulatory Visit
Admission: RE | Admit: 2014-07-12 | Discharge: 2014-07-12 | Disposition: A | Discharge status: Home / Self Care | Payer: Medicare Other | Source: Ambulatory Visit | Attending: Internal Medicine | Admitting: Internal Medicine

## 2014-07-12 ENCOUNTER — Encounter: Payer: Self-pay | Admitting: Internal Medicine

## 2014-07-12 DIAGNOSIS — R51 Headache: Secondary | ICD-10-CM

## 2014-07-12 DIAGNOSIS — IMO0001 Reserved for inherently not codable concepts without codable children: Secondary | ICD-10-CM

## 2014-07-12 DIAGNOSIS — R209 Unspecified disturbances of skin sensation: Secondary | ICD-10-CM

## 2014-09-09 ENCOUNTER — Telehealth: Payer: Self-pay | Admitting: Internal Medicine

## 2014-09-09 NOTE — Telephone Encounter (Signed)
Pt called in and what to know if Dr [name] recommends Pneumonia booster?     Thank you

## 2014-09-09 NOTE — Telephone Encounter (Signed)
Yes, prevnar

## 2014-09-30 ENCOUNTER — Encounter: Payer: Self-pay | Admitting: Internal Medicine

## 2014-09-30 ENCOUNTER — Ambulatory Visit (INDEPENDENT_AMBULATORY_CARE_PROVIDER_SITE_OTHER): Payer: Medicare Other | Admitting: Internal Medicine

## 2014-09-30 ENCOUNTER — Other Ambulatory Visit (INDEPENDENT_AMBULATORY_CARE_PROVIDER_SITE_OTHER): Payer: Medicare Other

## 2014-09-30 VITALS — BP 120/80 | HR 74 | Temp 98.3°F | Ht 60.0 in | Wt 129.0 lb

## 2014-09-30 DIAGNOSIS — E038 Other specified hypothyroidism: Secondary | ICD-10-CM

## 2014-09-30 DIAGNOSIS — Z23 Encounter for immunization: Secondary | ICD-10-CM

## 2014-09-30 DIAGNOSIS — E785 Hyperlipidemia, unspecified: Secondary | ICD-10-CM

## 2014-09-30 DIAGNOSIS — Z0000 Encounter for general adult medical examination without abnormal findings: Secondary | ICD-10-CM | POA: Insufficient documentation

## 2014-09-30 DIAGNOSIS — R739 Hyperglycemia, unspecified: Secondary | ICD-10-CM

## 2014-09-30 LAB — BASIC METABOLIC PANEL WITH GFR
BUN: 10 mg/dL (ref 6–23)
CO2: 24 meq/L (ref 19–32)
Calcium: 9.2 mg/dL (ref 8.4–10.5)
Chloride: 104 meq/L (ref 96–112)
Creatinine, Ser: 0.7 mg/dL (ref 0.4–1.2)
GFR: 88.71 mL/min
Glucose, Bld: 94 mg/dL (ref 70–99)
Potassium: 4.2 meq/L (ref 3.5–5.1)
Sodium: 138 meq/L (ref 135–145)

## 2014-09-30 LAB — HEMOGLOBIN A1C: HEMOGLOBIN A1C: 5.4 % (ref 4.6–6.5)

## 2014-09-30 LAB — TSH: TSH: 0.43 u[IU]/mL (ref 0.35–4.50)

## 2014-09-30 NOTE — Progress Notes (Signed)
Pre visit review using our clinic review tool, if applicable. No additional management support is needed unless otherwise documented below in the visit note. 

## 2014-09-30 NOTE — Assessment & Plan Note (Signed)
Her TSH is in the normal range She will stay on the current dose 

## 2014-09-30 NOTE — Assessment & Plan Note (Signed)
She is not willing to take a statin

## 2014-09-30 NOTE — Assessment & Plan Note (Signed)
Improvement noted on this

## 2014-09-30 NOTE — Progress Notes (Signed)
Subjective:    Patient ID: [name], female    DOB: May 15, 1941, 73 y.o.   MRN: [id number]  Thyroid Problem Presents for follow-up visit. Symptoms include weight gain. Patient reports no anxiety, cold intolerance, constipation, depressed mood, diaphoresis, diarrhea, dry skin, fatigue, hair loss, heat intolerance, hoarse voice, leg swelling, nail problem, palpitations, tremors, visual change or weight loss. The symptoms have been stable. Past treatments include levothyroxine. The treatment provided significant relief.      Review of Systems  Constitutional: Positive for weight gain. Negative for fever, chills, weight loss, diaphoresis, appetite change and fatigue.  HENT: Negative.  Negative for hoarse voice.   Eyes: Negative.   Respiratory: Negative.  Negative for cough, choking, chest tightness, shortness of breath and stridor.   Cardiovascular: Negative.  Negative for chest pain, palpitations and leg swelling.  Gastrointestinal: Negative.  Negative for vomiting, abdominal pain, diarrhea, constipation and blood in stool.  Endocrine: Negative.  Negative for cold intolerance and heat intolerance.  Genitourinary: Negative.   Musculoskeletal: Negative.   Skin: Negative.   Allergic/Immunologic: Negative.   Neurological: Negative.  Negative for dizziness and tremors.  Hematological: Negative.  Negative for adenopathy. Does not bruise/bleed easily.  Psychiatric/Behavioral: Negative.  The patient is not nervous/anxious.        Objective:   Physical Exam  Constitutional: She is oriented to person, place, and time. She appears well-developed and well-nourished. No distress.  HENT:  Head: Normocephalic and atraumatic.  Mouth/Throat: Oropharynx is clear and moist. No oropharyngeal exudate.  Eyes: Conjunctivae are normal. Right eye exhibits no discharge. Left eye exhibits no discharge. No scleral icterus.  Neck: Normal range of motion. Neck supple. No JVD present. No tracheal deviation  present. No thyromegaly present.  Cardiovascular: Normal rate, regular rhythm, normal heart sounds and intact distal pulses.  Exam reveals no gallop and no friction rub.   No murmur heard. Pulmonary/Chest: Effort normal and breath sounds normal. No stridor. No respiratory distress. She has no wheezes. She has no rales. She exhibits no tenderness.  Abdominal: Soft. Bowel sounds are normal. She exhibits no distension and no mass. There is no tenderness. There is no rebound and no guarding. Hernia confirmed negative in the right inguinal area and confirmed negative in the left inguinal area.  Genitourinary: Rectum normal and vagina normal. Rectal exam shows no external hemorrhoid, no internal hemorrhoid, no fissure, no mass, no tenderness and anal tone normal. Guaiac negative stool. No breast swelling, tenderness, discharge or bleeding. Pelvic exam was performed with patient supine. No labial fusion. There is no rash, tenderness, lesion or injury on the right labia. There is no tenderness, lesion or injury on the left labia. Cervix exhibits no discharge. Right adnexum displays no mass, no tenderness and no fullness. Left adnexum displays no mass, no tenderness and no fullness. No erythema, tenderness or bleeding in the vagina. No foreign body around the vagina. No signs of injury around the vagina. No vaginal discharge found.  Musculoskeletal: Normal range of motion. She exhibits no edema or tenderness.  Lymphadenopathy:    She has no cervical adenopathy.       Right: No inguinal adenopathy present.       Left: No inguinal adenopathy present.  Neurological: She is oriented to person, place, and time.  Skin: Skin is warm and dry. No rash noted. She is not diaphoretic. No erythema. No pallor.  Psychiatric: She has a normal mood and affect. Her behavior is normal. Judgment and thought content normal.  Vitals  reviewed.     Lab Results  Component Value Date   WBC 4.7 04/05/2014   HGB 14.1 04/05/2014    HCT 41.7 04/05/2014   PLT 218.0 04/05/2014   GLUCOSE 96 04/05/2014   CHOL 212* 07/02/2014   TRIG 54.0 07/02/2014   HDL 66.00 07/02/2014   LDLDIRECT 130.0 08/03/2011   LDLCALC 135* 07/02/2014   ALT 17 05/26/2012   AST 20 05/26/2012   NA 141 04/05/2014   K 4.4 04/05/2014   CL 106 04/05/2014   CREATININE 0.8 04/05/2014   BUN 12 04/05/2014   CO2 27 04/05/2014   TSH 0.02* 07/02/2014   HGBA1C 5.5 07/02/2014      Assessment & Plan:

## 2014-09-30 NOTE — Patient Instructions (Signed)
Health Maintenance Adopting a healthy lifestyle and getting preventive care can go a long way to promote health and wellness. Talk with your health care provider about what schedule of regular examinations is right for you. This is a good chance for you to check in with your provider about disease prevention and staying healthy. In between checkups, there are plenty of things you can do on your own. Experts have done a lot of research about which lifestyle changes and preventive measures are most likely to keep you healthy. Ask your health care provider for more information. WEIGHT AND DIET  Eat a healthy diet  Be sure to include plenty of vegetables, fruits, low-fat dairy products, and lean protein.  Do not eat a lot of foods high in solid fats, added sugars, or salt.  Get regular exercise. This is one of the most important things you can do for your health.  Most adults should exercise for at least 150 minutes each week. The exercise should increase your heart rate and make you sweat (moderate-intensity exercise).  Most adults should also do strengthening exercises at least twice a week. This is in addition to the moderate-intensity exercise.  Maintain a healthy weight  Body mass index (BMI) is a measurement that can be used to identify possible weight problems. It estimates body fat based on height and weight. Your health care provider can help determine your BMI and help you achieve or maintain a healthy weight.  For females 25 years of age and older:   A BMI below 18.5 is considered underweight.  A BMI of 18.5 to 24.9 is normal.  A BMI of 25 to 29.9 is considered overweight.  A BMI of 30 and above is considered obese.  Watch levels of cholesterol and blood lipids  You should start having your blood tested for lipids and cholesterol at 72 years of age, then have this test every 5 years.  You may need to have your cholesterol levels checked more often if:  Your lipid or  cholesterol levels are high.  You are older than 72 years of age.  You are at high risk for heart disease.  CANCER SCREENING   Lung Cancer  Lung cancer screening is recommended for adults 97-92 years old who are at high risk for lung cancer because of a history of smoking.  A yearly low-dose CT scan of the lungs is recommended for people who:  Currently smoke.  Have quit within the past 15 years.  Have at least a 30-pack-year history of smoking. A pack year is smoking an average of one pack of cigarettes a day for 1 year.  Yearly screening should continue until it has been 15 years since you quit.  Yearly screening should stop if you develop a health problem that would prevent you from having lung cancer treatment.  Breast Cancer  Practice breast self-awareness. This means understanding how your breasts normally appear and feel.  It also means doing regular breast self-exams. Let your health care provider know about any changes, no matter how small.  If you are in your 20s or 30s, you should have a clinical breast exam (CBE) by a health care provider every 1-3 years as part of a regular health exam.  If you are 76 or older, have a CBE every year. Also consider having a breast X-ray (mammogram) every year.  If you have a family history of breast cancer, talk to your health care provider about genetic screening.  If you are  at high risk for breast cancer, talk to your health care provider about having an MRI and a mammogram every year.  Breast cancer gene (BRCA) assessment is recommended for women who have family members with BRCA-related cancers. BRCA-related cancers include:  Breast.  Ovarian.  Tubal.  Peritoneal cancers.  Results of the assessment will determine the need for genetic counseling and BRCA1 and BRCA2 testing. Cervical Cancer Routine pelvic examinations to screen for cervical cancer are no longer recommended for nonpregnant women who are considered low  risk for cancer of the pelvic organs (ovaries, uterus, and vagina) and who do not have symptoms. A pelvic examination may be necessary if you have symptoms including those associated with pelvic infections. Ask your health care provider if a screening pelvic exam is right for you.   The Pap test is the screening test for cervical cancer for women who are considered at risk.  If you had a hysterectomy for a problem that was not cancer or a condition that could lead to cancer, then you no longer need Pap tests.  If you are older than 65 years, and you have had normal Pap tests for the past 10 years, you no longer need to have Pap tests.  If you have had past treatment for cervical cancer or a condition that could lead to cancer, you need Pap tests and screening for cancer for at least 20 years after your treatment.  If you no longer get a Pap test, assess your risk factors if they change (such as having a new sexual partner). This can affect whether you should start being screened again.  Some women have medical problems that increase their chance of getting cervical cancer. If this is the case for you, your health care provider may recommend more frequent screening and Pap tests.  The human papillomavirus (HPV) test is another test that may be used for cervical cancer screening. The HPV test looks for the virus that can cause cell changes in the cervix. The cells collected during the Pap test can be tested for HPV.  The HPV test can be used to screen women 30 years of age and older. Getting tested for HPV can extend the interval between normal Pap tests from three to five years.  An HPV test also should be used to screen women of any age who have unclear Pap test results.  After 72 years of age, women should have HPV testing as often as Pap tests.  Colorectal Cancer  This type of cancer can be detected and often prevented.  Routine colorectal cancer screening usually begins at 72 years of  age and continues through 72 years of age.  Your health care provider may recommend screening at an earlier age if you have risk factors for colon cancer.  Your health care provider may also recommend using home test kits to check for hidden blood in the stool.  A small camera at the end of a tube can be used to examine your colon directly (sigmoidoscopy or colonoscopy). This is done to check for the earliest forms of colorectal cancer.  Routine screening usually begins at age 50.  Direct examination of the colon should be repeated every 5-10 years through 72 years of age. However, you may need to be screened more often if early forms of precancerous polyps or small growths are found. Skin Cancer  Check your skin from head to toe regularly.  Tell your health care provider about any new moles or changes in   moles, especially if there is a change in a mole's shape or color.  Also tell your health care provider if you have a mole that is larger than the size of a pencil eraser.  Always use sunscreen. Apply sunscreen liberally and repeatedly throughout the day.  Protect yourself by wearing long sleeves, pants, a wide-brimmed hat, and sunglasses whenever you are outside. HEART DISEASE, DIABETES, AND HIGH BLOOD PRESSURE   Have your blood pressure checked at least every 1-2 years. High blood pressure causes heart disease and increases the risk of stroke.  If you are between 75 years and 42 years old, ask your health care provider if you should take aspirin to prevent strokes.  Have regular diabetes screenings. This involves taking a blood sample to check your fasting blood sugar level.  If you are at a normal weight and have a low risk for diabetes, have this test once every three years after 72 years of age.  If you are overweight and have a high risk for diabetes, consider being tested at a younger age or more often. PREVENTING INFECTION  Hepatitis B  If you have a higher risk for  hepatitis B, you should be screened for this virus. You are considered at high risk for hepatitis B if:  You were born in a country where hepatitis B is common. Ask your health care provider which countries are considered high risk.  Your parents were born in a high-risk country, and you have not been immunized against hepatitis B (hepatitis B vaccine).  You have HIV or AIDS.  You use needles to inject street drugs.  You live with someone who has hepatitis B.  You have had sex with someone who has hepatitis B.  You get hemodialysis treatment.  You take certain medicines for conditions, including cancer, organ transplantation, and autoimmune conditions. Hepatitis C  Blood testing is recommended for:  Everyone born from [birth year] through 1964.  Anyone with known risk factors for hepatitis C. Sexually transmitted infections (STIs)  You should be screened for sexually transmitted infections (STIs) including gonorrhea and chlamydia if:  You are sexually active and are younger than 72 years of age.  You are older than 72 years of age and your health care provider tells you that you are at risk for this type of infection.  Your sexual activity has changed since you were last screened and you are at an increased risk for chlamydia or gonorrhea. Ask your health care provider if you are at risk.  If you do not have HIV, but are at risk, it may be recommended that you take a prescription medicine daily to prevent HIV infection. This is called pre-exposure prophylaxis (PrEP). You are considered at risk if:  You are sexually active and do not regularly use condoms or know the HIV status of your partner(s).  You take drugs by injection.  You are sexually active with a partner who has HIV. Talk with your health care provider about whether you are at high risk of being infected with HIV. If you choose to begin PrEP, you should first be tested for HIV. You should then be tested every 3 months for  as long as you are taking PrEP.  PREGNANCY   If you are premenopausal and you may become pregnant, ask your health care provider about preconception counseling.  If you may become pregnant, take 400 to 800 micrograms (mcg) of folic acid every day.  If you want to prevent pregnancy, talk to your  health care provider about birth control (contraception). OSTEOPOROSIS AND MENOPAUSE   Osteoporosis is a disease in which the bones lose minerals and strength with aging. This can result in serious bone fractures. Your risk for osteoporosis can be identified using a bone density scan.  If you are 65 years of age or older, or if you are at risk for osteoporosis and fractures, ask your health care provider if you should be screened.  Ask your health care provider whether you should take a calcium or vitamin D supplement to lower your risk for osteoporosis.  Menopause may have certain physical symptoms and risks.  Hormone replacement therapy may reduce some of these symptoms and risks. Talk to your health care provider about whether hormone replacement therapy is right for you.  HOME CARE INSTRUCTIONS   Schedule regular health, dental, and eye exams.  Stay current with your immunizations.   Do not use any tobacco products including cigarettes, chewing tobacco, or electronic cigarettes.  If you are pregnant, do not drink alcohol.  If you are breastfeeding, limit how much and how often you drink alcohol.  Limit alcohol intake to no more than 1 drink per day for nonpregnant women. One drink equals 12 ounces of beer, 5 ounces of wine, or 1 ounces of hard liquor.  Do not use street drugs.  Do not share needles.  Ask your health care provider for help if you need support or information about quitting drugs.  Tell your health care provider if you often feel depressed.  Tell your health care provider if you have ever been abused or do not feel safe at home. Document Released: 05/24/2011  Document Revised: 03/25/2014 Document Reviewed: 10/10/2013 Exit[employer] Patient Information 2015 [employer]. This information is not intended to replace advice given to you by your health care provider. Make sure you discuss any questions you have with your health care provider. Hypothyroidism The thyroid is a large gland located in the lower front of your neck. The thyroid gland helps control metabolism. Metabolism is how your body handles food. It controls metabolism with the hormone thyroxine. When this gland is underactive (hypothyroid), it produces too little hormone.  CAUSES These include:   Absence or destruction of thyroid tissue.  Goiter due to iodine deficiency.  Goiter due to medications.  Congenital defects (since birth).  Problems with the pituitary. This causes a lack of TSH (thyroid stimulating hormone). This hormone tells the thyroid to turn out more hormone. SYMPTOMS  Lethargy (feeling as though you have no energy)  Cold intolerance  Weight gain (in spite of normal food intake)  Dry skin  Coarse hair  Menstrual irregularity (if severe, may lead to infertility)  Slowing of thought processes Cardiac problems are also caused by insufficient amounts of thyroid hormone. Hypothyroidism in the newborn is cretinism, and is an extreme form. It is important that this form be treated adequately and immediately or it will lead rapidly to retarded physical and mental development. DIAGNOSIS  To prove hypothyroidism, your caregiver may do blood tests and ultrasound tests. Sometimes the signs are hidden. It may be necessary for your caregiver to watch this illness with blood tests either before or after diagnosis and treatment. TREATMENT  Low levels of thyroid hormone are increased by using synthetic thyroid hormone. This is a safe, effective treatment. It usually takes about four weeks to gain the full effects of the medication. After you have the full effect of the medication,  it will generally take another four weeks   for problems to leave. Your caregiver may start you on low doses. If you have had heart problems the dose may be gradually increased. It is generally not an emergency to get rapidly to normal. HOME CARE INSTRUCTIONS   Take your medications as your caregiver suggests. Let your caregiver know of any medications you are taking or start taking. Your caregiver will help you with dosage schedules.  As your condition improves, your dosage needs may increase. It will be necessary to have continuing blood tests as suggested by your caregiver.  Report all suspected medication side effects to your caregiver. SEEK MEDICAL CARE IF: Seek medical care if you develop:  Sweating.  Tremulousness (tremors).  Anxiety.  Rapid weight loss.  Heat intolerance.  Emotional swings.  Diarrhea.  Weakness. SEEK IMMEDIATE MEDICAL CARE IF:  You develop chest pain, an irregular heart beat (palpitations), or a rapid heart beat. MAKE SURE YOU:   Understand these instructions.  Will watch your condition.  Will get help right away if you are not doing well or get worse. Document Released: 11/08/2005 Document Revised: 01/31/2012 Document Reviewed: 06/28/2008 Exit[employer] Patient Information 2015 [employer]. This information is not intended to replace advice given to you by your health care provider. Make sure you discuss any questions you have with your health care provider.  

## 2014-09-30 NOTE — Assessment & Plan Note (Signed)

## 2014-10-13 ENCOUNTER — Emergency Department (HOSPITAL_COMMUNITY)
Admission: EM | Admit: 2014-10-13 | Discharge: 2014-10-13 | Disposition: A | Discharge status: Home / Self Care | Payer: Medicare Other | Attending: Emergency Medicine | Admitting: Emergency Medicine

## 2014-10-13 ENCOUNTER — Encounter (HOSPITAL_COMMUNITY): Payer: Self-pay | Admitting: *Deleted

## 2014-10-13 DIAGNOSIS — Z87891 Personal history of nicotine dependence: Secondary | ICD-10-CM | POA: Diagnosis not present

## 2014-10-13 DIAGNOSIS — E039 Hypothyroidism, unspecified: Secondary | ICD-10-CM | POA: Diagnosis not present

## 2014-10-13 DIAGNOSIS — Z7982 Long term (current) use of aspirin: Secondary | ICD-10-CM | POA: Insufficient documentation

## 2014-10-13 DIAGNOSIS — I7300 Raynaud's syndrome without gangrene: Secondary | ICD-10-CM | POA: Insufficient documentation

## 2014-10-13 DIAGNOSIS — K219 Gastro-esophageal reflux disease without esophagitis: Secondary | ICD-10-CM | POA: Diagnosis not present

## 2014-10-13 DIAGNOSIS — Z79899 Other long term (current) drug therapy: Secondary | ICD-10-CM | POA: Insufficient documentation

## 2014-10-13 DIAGNOSIS — K5900 Constipation, unspecified: Secondary | ICD-10-CM | POA: Insufficient documentation

## 2014-10-13 DIAGNOSIS — R000 Tachycardia, unspecified: Secondary | ICD-10-CM | POA: Insufficient documentation

## 2014-10-13 LAB — CBC WITH DIFFERENTIAL/PLATELET
BASOS PCT: 0 % (ref 0–1)
Basophils Absolute: 0 10*3/uL (ref 0.0–0.1)
EOS ABS: 0 10*3/uL (ref 0.0–0.7)
EOS PCT: 0 % (ref 0–5)
HCT: 42.6 % (ref 36.0–46.0)
Hemoglobin: 14.5 g/dL (ref 12.0–15.0)
LYMPHS PCT: 22 % (ref 12–46)
Lymphs Abs: 1.1 10*3/uL (ref 0.7–4.0)
MCH: 31 pg (ref 26.0–34.0)
MCHC: 34 g/dL (ref 30.0–36.0)
MCV: 91 fL (ref 78.0–100.0)
Monocytes Absolute: 0.3 10*3/uL (ref 0.1–1.0)
Monocytes Relative: 5 % (ref 3–12)
Neutro Abs: 3.6 10*3/uL (ref 1.7–7.7)
Neutrophils Relative %: 73 % (ref 43–77)
PLATELETS: 222 10*3/uL (ref 150–400)
RBC: 4.68 MIL/uL (ref 3.87–5.11)
RDW: 12.9 % (ref 11.5–15.5)
WBC: 4.9 10*3/uL (ref 4.0–10.5)

## 2014-10-13 LAB — COMPREHENSIVE METABOLIC PANEL
ALT: 15 U/L (ref 0–35)
AST: 19 U/L (ref 0–37)
Albumin: 3.9 g/dL (ref 3.5–5.2)
Alkaline Phosphatase: 50 U/L (ref 39–117)
Anion gap: 12 (ref 5–15)
BILIRUBIN TOTAL: 0.4 mg/dL (ref 0.3–1.2)
BUN: 15 mg/dL (ref 6–23)
CO2: 24 mEq/L (ref 19–32)
CREATININE: 0.71 mg/dL (ref 0.50–1.10)
Calcium: 9.1 mg/dL (ref 8.4–10.5)
Chloride: 99 mEq/L (ref 96–112)
GFR calc Af Amer: >90 mL/min (ref >90)
GFR, EST NON AFRICAN AMERICAN: 84 mL/min — AB (ref >90)
Glucose, Bld: 110 mg/dL — ABNORMAL HIGH (ref 70–99)
POTASSIUM: 4.1 meq/L (ref 3.7–5.3)
Sodium: 135 mEq/L — ABNORMAL LOW (ref 137–147)
Total Protein: 7.1 g/dL (ref 6.0–8.3)

## 2014-10-13 LAB — D-DIMER, QUANTITATIVE: D-Dimer, Quant: 0.44 ug/mL-FEU (ref 0.00–0.48)

## 2014-10-13 LAB — TROPONIN I

## 2014-10-13 MED ORDER — ALPRAZOLAM 0.5 MG PO TABS
0.2500 mg | ORAL_TABLET | Freq: Once | ORAL | Status: AC
  Administered 2014-10-13: 0.25 mg via ORAL
  Filled 2014-10-13: qty 1

## 2014-10-13 NOTE — ED Provider Notes (Signed)
Pt presented to ED with complaints of palpitations.  History of anxiety, gerd and hypothyroidism.  Denied shortness of breath or chest pain but while waiting in the ED admitted she felt some mild vague discomfort in her chest. Physical Exam  BP 139/69 mmHg  Pulse 99  Temp(Src) 97.1 F (36.2 C) (Oral)  Resp 17  Ht 5' (1.524 m)  Wt 129 lb (58.514 kg)  BMI 25.19 kg/m2  SpO2 100%  Physical Exam  Constitutional: She appears well-developed and well-nourished. No distress.  HENT:  Head: Normocephalic and atraumatic.  Right Ear: External ear normal.  Left Ear: External ear normal.  Eyes: Conjunctivae are normal. Right eye exhibits no discharge. Left eye exhibits no discharge. No scleral icterus.  Neck: Neck supple. No tracheal deviation present.  No neck mass  Cardiovascular: Normal rate, regular rhythm and normal heart sounds.   Pulmonary/Chest: Effort normal and breath sounds normal. No stridor. No respiratory distress. She has no wheezes. She has no rales.  Musculoskeletal: She exhibits no edema.  Neurological: She is alert. Cranial nerve deficit: no gross deficits.  Skin: Skin is warm and dry. No rash noted.  Psychiatric: She has a normal mood and affect.  Nursing note and vitals reviewed.   ED Course  Procedures  EKG Interpretation  Date/Time:  Sunday October 13 2014 09:42:59 EST Ventricular Rate:  102 PR Interval:  128 QRS Duration: 64 QT Interval:  341 QTC Calculation: 444 R Axis:   74 Text Interpretation:  Sinus or ectopic atrial tachycardia Artifact Since last tracing rate faster Otherwise no significant change Confirmed by Marlin Jarrard  MD-J, Aanchal Cope ([id number]) on 10/13/2014 9:46:05 AM       MDM Pt relatively asymptomatic in the ED.  Initial EKG with sinus rhythm.  Plan on 2 sets of cardiac enzymes and d dimer.  Overall, pt does not appear ill or in distress.    Cardiac enzymes and d dimer negative.  Mild sinus tachycardia but no other concerning findings.  Recent thyroid  panel unremarkable.  ?anxiety Follow up with PCP.  Warning signs and precautions discussed      Dorie Rank, MD 10/13/14 [id number]

## 2014-10-13 NOTE — ED Notes (Signed)
Pt. Presents via [facility] from home with C.O. Tachycardia. Pt. Awoke at 0330 feeling like her heart was jumping out of her chest. PT. With hx of anxiety, hypothyroidism, and GERD. Pt. Took 0.5mg  of Xanax, Troche, 60mg  of Armthyroid and 324mg  of aspirin PTA.

## 2014-10-13 NOTE — ED Notes (Signed)
Pt comfortable with discharge and follow up instructions. Pt declines wheelchair, escorted to waiting area by this RN. No prescriptions. 

## 2014-10-13 NOTE — Discharge Instructions (Signed)
-  Your tachycardia is most likely related to anxiety. -We ran several tests to make sure you are not having a heart attack, blood clot, or infection and they were all normal. -Make sure to follow up with your primary care doctor to check if your heart rate has resolved.

## 2014-10-13 NOTE — ED Provider Notes (Signed)
CSN: [id number]     Arrival date & time 10/13/14  0935 History   First MD Initiated Contact with Patient 10/13/14 [phone]     Chief Complaint  Patient presents with  . Tachycardia   HPI [name] is a 73 year old woman with history of anxiety, GERD, hypothyroidism, and migraines presenting with palpitations.  She reports waking from sleep this morning at 3 am feeling as though her heart was beating fast.  She took her pulse and it was reportedly 140.  She took a dose of Xanax at 4 am and two aspirin 81 mg tablets without resolution of her palpitations, so she called EMS.  She currently denies any anxiety.  She denies any chest pain, shortness of breath, fevers, chills, nausea, lower extremity swelling or pain.   Past Medical History  Diagnosis Date  . CHEST PAIN 04/30/2009  . COMMON MIGRAINE 04/30/2009  . CONSTIPATION 04/30/2009  . ESOPHAGEAL STRICTURE 04/30/2009  . GERD 04/30/2009  . HIATAL HERNIA 04/30/2009  . HYPOTHYROIDISM 04/30/2009  . Irritable bowel syndrome 04/30/2009  . RAYNAUD'S DISEASE 04/30/2009  . Hyperlipidemia   . Hx of cardiovascular stress test     ETT-Myoview (04/2014):  No ischemia, EF 85%; Normal   Past Surgical History  Procedure Laterality Date  . Abdominal hysterectomy  1976  . Tonsillectomy  1990  . Ectopic pregnancy surgery  1970  . Rhinoplasty  1980  . Foot surgury  1997  . Colonoscopy    . Upper gastrointestinal endoscopy     Family History  Problem Relation Age of Onset  . Heart disease Father   . Cancer Sister     breast cancer  . Diabetes Maternal Aunt   . Diabetes Maternal Uncle   . Cancer Paternal Aunt     lung cancer  . Diabetes Maternal Grandmother   . Heart disease Paternal Grandmother   . Heart disease Paternal Grandfather   . Colon cancer Neg Hx    History  Substance Use Topics  . Smoking status: Former Smoker    Quit date: 10/27/1984  . Smokeless tobacco: Never Used     Comment: quit 1985  . Alcohol Use: Yes     Comment: 1-2 per week    OB History    No data available     Review of Systems  Constitutional: Negative for fever, chills, diaphoresis and appetite change.  HENT: Negative for congestion and rhinorrhea.   Respiratory: Negative for cough, chest tightness and shortness of breath.   Cardiovascular: Positive for palpitations. Negative for chest pain.  Gastrointestinal: Negative for nausea, vomiting, diarrhea, constipation and abdominal distention.  Genitourinary: Negative for dysuria and difficulty urinating.  Musculoskeletal: Negative for myalgias and arthralgias.  Skin: Negative for rash.  Neurological: Negative for dizziness, syncope, speech difficulty, weakness, light-headedness and numbness.  Psychiatric/Behavioral: Negative for agitation. The patient is not nervous/anxious.     Allergies  Review of patient's allergies indicates no known allergies.  Home Medications   Prior to Admission medications   Medication Sig Start Date End Date Taking? Authorizing Provider  ALPRAZolam (XANAX) 0.25 MG tablet Take 1 tablet (0.25 mg total) by mouth 3 (three) times daily as needed for sleep or anxiety. 06/19/12  Yes Janith Lima, MD  AMBULATORY NON FORMULARY MEDICATION Triest-Progesterone/Testosterone/DHEA Takes as directed   Yes Historical Provider, MD  Ascorbic Acid (VITAMIN C) 1000 MG tablet Take 1,000 mg by mouth daily.     Yes Historical Provider, MD  aspirin 81 MG tablet Take  162 mg by mouth daily.   Yes Historical Provider, MD  Cholecalciferol (VITAMIN D3) 5000 UNITS TABS Take by mouth.     Yes Historical Provider, MD  Coenzyme Q10 (CO Q 10) 10 MG CAPS Take by mouth daily.     Yes Historical Provider, MD  Cyanocobalamin (VITAMIN B-12 CR) 1500 MCG TBCR Take by mouth daily.    Yes Historical Provider, MD  Magnesium Chloride (MAGNESIUM DR PO) Take 1,000 mg by mouth.    Yes Historical Provider, MD  Omega-3 Fatty Acids (FISH OIL) 1000 MG CAPS Take by mouth daily.     Yes Historical Provider, MD  pantoprazole  (PROTONIX) 40 MG tablet Take 1 tablet (40 mg total) by mouth 2 (two) times daily. 03/24/12  Yes Sable Feil, MD  psyllium (METAMUCIL) 58.6 % powder Take 1 packet by mouth 3 (three) times daily. Mix 1 tbsp with juice    Yes Historical Provider, MD  thyroid (ARMOUR THYROID) 60 MG tablet Take 1 tablet (60 mg total) by mouth daily before breakfast. 07/03/14  Yes Janith Lima, MD  vitamin A (RA VITAMIN A) 10000 UNIT capsule Take 10,000 Units by mouth daily.     Yes Historical Provider, MD  Zinc 25 MG TABS Take by mouth daily.     Yes Historical Provider, MD  CALCIUM PO Take by mouth. Takes 800 mg daily     Historical Provider, MD  Magnesium Hydroxide (MILK OF MAGNESIA PO) Take by mouth as needed. Mag 625 3in am 3 in pm    Historical Provider, MD  nitroGLYCERIN (NITROSTAT) 0.4 MG SL tablet Place 1 tablet (0.4 mg total) under the tongue every 5 (five) minutes as needed for chest pain. 08/20/13   Fay Records, MD  ranitidine (ZANTAC) 150 MG capsule Take 1 capsule (150 mg total) by mouth every evening. 04/05/14   Scott T Weaver, PA-C   BP 134/76 mmHg  Pulse 102  Temp(Src) 97.1 F (36.2 C) (Oral)  Resp 21  Ht 5' (1.524 m)  Wt 129 lb (58.514 kg)  BMI 25.19 kg/m2  SpO2 99% Physical Exam  Constitutional: She is oriented to person, place, and time. She appears well-developed and well-nourished.  Appears slightly anxious.  HENT:  Head: Normocephalic and atraumatic.  Eyes: Conjunctivae and EOM are normal. Pupils are equal, round, and reactive to light. No scleral icterus.  Neck: Normal range of motion. Neck supple.  Cardiovascular: Regular rhythm and normal heart sounds.   Tachycardic to 102.  Pulmonary/Chest: Effort normal and breath sounds normal. No respiratory distress.  Abdominal: Soft. Bowel sounds are normal. She exhibits no distension.  Musculoskeletal: Normal range of motion. She exhibits no edema or tenderness.  Neurological: She is alert and oriented to person, place, and time. No  cranial nerve deficit. She exhibits normal muscle tone.  Skin: Skin is warm and dry. No rash noted. She is not diaphoretic. No erythema.    ED Course  Procedures (including critical care time) Labs Review Labs Reviewed  COMPREHENSIVE METABOLIC PANEL - Abnormal; Notable for the following:    Sodium 135 (*)    Glucose, Bld 110 (*)    GFR calc non Af Amer 84 (*)    All other components within normal limits  CBC WITH DIFFERENTIAL  TROPONIN I  TROPONIN I  D-DIMER, QUANTITATIVE    Imaging Review No results found.   EKG Interpretation   Date/Time:  Sunday October 13 2014 09:42:59 EST Ventricular Rate:  102 PR Interval:  128 QRS Duration:  64 QT Interval:  341 QTC Calculation: 444 R Axis:   74 Text Interpretation:  Sinus or ectopic atrial tachycardia Artifact Since  last tracing rate faster Otherwise no significant change Confirmed by  KNAPP  MD-J, JON ([id number]) on 10/13/2014 9:46:05 AM      MDM   Final diagnoses:  Tachycardia   10:08 am: History most consistent with anxiety with pulse now improved to 102.  EKG consistent with sinus tachycardia.  No signs or symptoms to suggest PE, ACS, or infection. TSH normal on 09/30/14.  Will check troponin, CMP, CBC and monitor.  11:58 am: Pulse stable 100-110.  Palpitations have resolved, now reporting an intermittent heavy feeling in her left chest.  Says she would like to try another Xanax.  Will check another troponin at 3 hours and d-dimer given chest heaviness.  1:30 pm: D-dimer and repeat troponin negative.  Symptoms have resolved and heart rate is stable from 100-110.  Will discharge home with instructions to follow up with PCP.  Arman Filter, MD 10/13/14 [place], MD 10/13/14 [phone]

## 2014-10-16 ENCOUNTER — Ambulatory Visit (INDEPENDENT_AMBULATORY_CARE_PROVIDER_SITE_OTHER): Payer: Medicare Other | Admitting: Internal Medicine

## 2014-10-16 ENCOUNTER — Encounter: Payer: Self-pay | Admitting: Internal Medicine

## 2014-10-16 VITALS — BP 134/86 | HR 81 | Temp 98.3°F | Ht 60.0 in | Wt 129.0 lb

## 2014-10-16 DIAGNOSIS — R002 Palpitations: Secondary | ICD-10-CM

## 2014-10-16 DIAGNOSIS — E038 Other specified hypothyroidism: Secondary | ICD-10-CM

## 2014-10-16 MED ORDER — METOPROLOL SUCCINATE ER 25 MG PO TB24: 25.0000 mg | ORAL_TABLET | Freq: Every day | ORAL | Status: DC

## 2014-10-16 NOTE — Progress Notes (Signed)
Pre visit review using our clinic review tool, if applicable. No additional management support is needed unless otherwise documented below in the visit note. 

## 2014-10-16 NOTE — Patient Instructions (Signed)

## 2014-10-16 NOTE — Progress Notes (Signed)
   Subjective:    Patient ID: [name], female    DOB: 04/10/1941, 73 y.o.   MRN: [id number]  Palpitations  This is a recurrent problem. The current episode started 1 to 4 weeks ago. The problem occurs intermittently. The problem has been unchanged. The symptoms are aggravated by fear. Associated symptoms include anxiety and chest fullness. Pertinent negatives include no chest pain, coughing, diaphoresis, dizziness, fever, irregular heartbeat, malaise/fatigue, nausea, near-syncope, numbness, shortness of breath, syncope, vomiting or weakness. She has tried anxiolytics for the symptoms. The treatment provided moderate relief. Her past medical history is significant for anxiety. There is no history of anemia, drug use, heart disease, hyperthyroidism or a valve disorder.      Review of Systems  Constitutional: Negative for fever, malaise/fatigue and diaphoresis.  Respiratory: Negative for cough and shortness of breath.   Cardiovascular: Positive for palpitations. Negative for chest pain, syncope and near-syncope.  Gastrointestinal: Negative for nausea and vomiting.  Neurological: Negative for dizziness, weakness and numbness.  Psychiatric/Behavioral: The patient is nervous/anxious.   All other systems reviewed and are negative.      Objective:   Physical Exam  Constitutional: She is oriented to person, place, and time. She appears well-developed and well-nourished. No distress.  HENT:  Head: Normocephalic and atraumatic.  Mouth/Throat: Oropharynx is clear and moist. No oropharyngeal exudate.  Eyes: Conjunctivae are normal. Right eye exhibits no discharge. Left eye exhibits no discharge. No scleral icterus.  Neck: Normal range of motion. Neck supple. No JVD present. No tracheal deviation present. No thyromegaly present.  Cardiovascular: Normal rate, regular rhythm, normal heart sounds and intact distal pulses.  Exam reveals no gallop and no friction rub.   No murmur  heard. Pulmonary/Chest: Effort normal and breath sounds normal. No stridor. No respiratory distress. She has no wheezes. She has no rales. She exhibits no tenderness.  Abdominal: Soft. Bowel sounds are normal. She exhibits no distension and no mass. There is no tenderness. There is no rebound and no guarding.  Musculoskeletal: Normal range of motion. She exhibits no edema or tenderness.  Lymphadenopathy:    She has no cervical adenopathy.  Neurological: She is oriented to person, place, and time.  Skin: Skin is warm and dry. No rash noted. She is not diaphoretic. No erythema. No pallor.  Psychiatric: She has a normal mood and affect. Her behavior is normal. Judgment and thought content normal.      Lab Results  Component Value Date   WBC 4.9 10/13/2014   HGB 14.5 10/13/2014   HCT 42.6 10/13/2014   PLT 222 10/13/2014   GLUCOSE 110* 10/13/2014   CHOL 212* 07/02/2014   TRIG 54.0 07/02/2014   HDL 66.00 07/02/2014   LDLDIRECT 130.0 08/03/2011   LDLCALC 135* 07/02/2014   ALT 15 10/13/2014   AST 19 10/13/2014   NA 135* 10/13/2014   K 4.1 10/13/2014   CL 99 10/13/2014   CREATININE 0.71 10/13/2014   BUN 15 10/13/2014   CO2 24 10/13/2014   TSH 0.43 09/30/2014   HGBA1C 5.4 09/30/2014      Assessment & Plan:

## 2014-10-21 NOTE — Assessment & Plan Note (Addendum)
Her palpitations sound benign, recent visit to the ER with labs was normal Her main complaint is that her HR is a little high Will get an event monitor to see if there is a dysrhythmia For now, I have reassured her that the palpitations sound benign

## 2014-10-21 NOTE — Assessment & Plan Note (Signed)
Her recent TSH was in the normal range I don't think this is causing her s/s Will follow

## 2014-10-23 ENCOUNTER — Telehealth: Payer: Self-pay | Admitting: Internal Medicine

## 2014-10-23 NOTE — Telephone Encounter (Signed)
Pt called in and said that Dr [name] wanted her to wear heart monitor but she is not sure what she is suppose to be doing?  She is requesting a return call.  Best number is [phone]

## 2014-10-24 NOTE — Telephone Encounter (Signed)
Per Lelon Frohlich, order is in process and [facility] office will call pt with appt info. Patient notified.

## 2014-11-05 ENCOUNTER — Ambulatory Visit (INDEPENDENT_AMBULATORY_CARE_PROVIDER_SITE_OTHER): Payer: Medicare Other | Admitting: Physician Assistant

## 2014-11-05 ENCOUNTER — Encounter: Payer: Self-pay | Admitting: *Deleted

## 2014-11-05 ENCOUNTER — Encounter (INDEPENDENT_AMBULATORY_CARE_PROVIDER_SITE_OTHER): Payer: Medicare Other

## 2014-11-05 ENCOUNTER — Encounter: Payer: Self-pay | Admitting: Physician Assistant

## 2014-11-05 VITALS — BP 122/52 | HR 68 | Ht 60.0 in | Wt 127.0 lb

## 2014-11-05 DIAGNOSIS — R002 Palpitations: Secondary | ICD-10-CM

## 2014-11-05 DIAGNOSIS — R000 Tachycardia, unspecified: Secondary | ICD-10-CM

## 2014-11-05 NOTE — Progress Notes (Signed)
Patient ID: [name], female   DOB: 04-28-1941, 73 y.o.   MRN: [id number] [employer] 30 day cardiac event monitor applied to patient.

## 2014-11-05 NOTE — Patient Instructions (Signed)
Your physician recommends that you continue on your current medications as directed. Please refer to the Current Medication list given to you today.  Your physician has requested that you have an echocardiogram. Echocardiography is a painless test that uses sound waves to create images of your heart. It provides your doctor with information about the size and shape of your heart and how well your heart's chambers and valves are working. This procedure takes approximately one hour. There are no restrictions for this procedure.  Your physician recommends that you schedule a follow-up appointment in: [place] DR. ROSS OR SCOTT WEAVER, PAC SAME DAY DR. Harrington Challenger IS IN THE OFFICE

## 2014-11-05 NOTE — Progress Notes (Signed)
Cardiology Office Note   Date:  11/05/2014   ID:  Promyse, Ardito [birth year], MRN [id number]  PCP:  Scarlette Calico, MD  Cardiologist:  Dr. Dorris Carnes     History of Present Illness: [name] is a 73 y.o. female with a history of chest pain, GERD, esophageal stricture status post dilatation in 2011, hypothyroidism, prior TIA. I saw her 04/05/14 for evaluation of chest pain. Nuclear stress test was normal.  Last seen here in 04/2014.  She was seen in the ED last month with palpitations.  ECG demonstrated sinus tachy.  Troponin and DDimer were both normal.  She FU with primary care.  An event monitor has been ordered.  She returns for FU.    She tells me that she was in her USOH until she was awoken by rapid palpitations on 11/22.  She counted her pulse at 144.  She denies feeling dizzy or short of breath.  She denies any chest pain, syncope, orthopnea, PND.  She denies LE edema.  She saw her PCP who placed her on low dose beta blocker (Toprol-XL 25 mg).  She has not had a recurrence of palpitations.     Studies: - Nuclear (04/2011): No ischemia, EF 101%   - Nuclear (04/2014): No ischemia, EF 85%, normal study  - Carotid US (08/2013): Normal carotids bilat. - ABI (03/2013): Normal   Recent Labs: 07/02/2014: LDL (calc) 135* 09/30/2014: TSH 0.43 10/13/2014: ALT 15; BUN 15; Creatinine 0.71; Hemoglobin 14.5; Potassium 4.1; Sodium 135*  10/13/2014: Troponin I <0.30  10/13/2014: D-Dimer 0.44    Recent Radiology: No results found.    Wt Readings from Last 3 Encounters:  11/05/14 127 lb (57.607 kg)  10/16/14 129 lb (58.514 kg)  10/13/14 129 lb (58.514 kg)     Past Medical History  Diagnosis Date  . CHEST PAIN 04/30/2009  . COMMON MIGRAINE 04/30/2009  . CONSTIPATION 04/30/2009  . ESOPHAGEAL STRICTURE 04/30/2009  . GERD 04/30/2009  . HIATAL HERNIA 04/30/2009  . HYPOTHYROIDISM 04/30/2009  . Irritable bowel syndrome 04/30/2009  . RAYNAUD'S DISEASE 04/30/2009  . Hyperlipidemia   .  Hx of cardiovascular stress test     ETT-Myoview (04/2014):  No ischemia, EF 85%; Normal    Current Outpatient Prescriptions  Medication Sig Dispense Refill  . ALPRAZolam (XANAX) 0.25 MG tablet Take 1 tablet (0.25 mg total) by mouth 3 (three) times daily as needed for sleep or anxiety. 65 tablet 5  . AMBULATORY NON FORMULARY MEDICATION Triest-Progesterone/Testosterone/DHEA Takes as directed    . Ascorbic Acid (VITAMIN C) 1000 MG tablet Take 1,000 mg by mouth daily.      Marland Kitchen aspirin 81 MG tablet Take 162 mg by mouth daily.    Marland Kitchen CALCIUM PO Take 1 capsule by mouth 2 (two) times a week. Takes 800 mg daily    . Cholecalciferol (VITAMIN D3) 5000 UNITS TABS Take 1 tablet by mouth daily.     . Coenzyme Q10 (CO Q 10) 10 MG CAPS Take 1 tablet by mouth daily.     . Cyanocobalamin (VITAMIN B-12 CR) 1500 MCG TBCR Take 1 tablet by mouth daily.     . Magnesium Hydroxide (MILK OF MAGNESIA PO) Take by mouth as needed. Mag 625 3in am 3 in pm    . metoprolol succinate (TOPROL-XL) 25 MG 24 hr tablet Take 1 tablet (25 mg total) by mouth daily. 90 tablet 1  . Omega-3 Fatty Acids (FISH OIL) 1000 MG CAPS Take 1 capsule by mouth daily.     Marland Kitchen  pantoprazole (PROTONIX) 40 MG tablet Take 1 tablet (40 mg total) by mouth 2 (two) times daily. 60 tablet 11  . psyllium (METAMUCIL) 58.6 % powder Take 1 packet by mouth 3 (three) times daily. Mix 1 tbsp with juice     . ranitidine (ZANTAC) 150 MG capsule Take 1 capsule (150 mg total) by mouth every evening.    . thyroid (ARMOUR THYROID) 60 MG tablet Take 1 tablet (60 mg total) by mouth daily before breakfast. 90 tablet 1  . vitamin A (RA VITAMIN A) 10000 UNIT capsule Take 10,000 Units by mouth daily.      . Zinc 25 MG TABS Take 1 tablet by mouth daily.      No current facility-administered medications for this visit.     Allergies:   Review of patient's allergies indicates no known allergies.   Social History:  The patient  reports that she quit smoking about 30 years ago.  She has never used smokeless tobacco. She reports that she drinks alcohol. She reports that she does not use illicit drugs.   Family History:  The patient's family history includes Cancer in her paternal aunt and sister; Diabetes in her maternal aunt, maternal grandmother, and maternal uncle; Heart attack in her father; Heart disease in her father, paternal grandfather, and paternal grandmother; Stroke in her cousin. There is no history of Colon cancer.    ROS:  Please see the history of present illness.      All other systems reviewed and negative.    PHYSICAL EXAM: VS:  BP 122/52 mmHg  Pulse 68  Ht 5' (1.524 m)  Wt 127 lb (57.607 kg)  BMI 24.80 kg/m2 Well nourished, well developed, in no acute distress HEENT: normal Neck: no JVD Cardiac:  normal S1, S2;  RRR; no murmur   Lungs:   clear to auscultation bilaterally, no wheezing, rhonchi or rales Abd: soft, nontender, no hepatomegaly Ext: no edema Skin: warm and dry Neuro:  CNs 2-12 intact, no focal abnormalities noted  EKG:  NSR, HR 68, Normal axis, no ST changes      ASSESSMENT AND PLAN:  1.  Palpitations:  Episode of rapid HR sounds quite suspicious for AFib.  CHADS2-VASc=4 (prior TIA, female, > 49 yo).  She would benefit from anticoagulation therapy if AFib is identified.  She is doing well with Toprol-XL 25 mg QD.  Agree with event monitor.  Will also get 2D Echo.    2.  Hypothyroidism:   09/30/2014: TSH 0.43  3.  Chest Pain:  No recurrence.  Myoview in 04/2014 was normal.     Disposition:   FU with me or Dr. Dorris Carnes in 6-8 weeks after her monitor is completed.   Signed, Versie Starks, MHS 11/05/2014 2:15 PM    [facility] [place], [place], [place]  [address] Phone: [phone]; Fax: [phone]

## 2014-11-08 ENCOUNTER — Ambulatory Visit (HOSPITAL_COMMUNITY): Payer: Medicare Other | Attending: Cardiovascular Disease | Admitting: Cardiology

## 2014-11-08 ENCOUNTER — Encounter: Payer: Self-pay | Admitting: Physician Assistant

## 2014-11-08 DIAGNOSIS — R002 Palpitations: Secondary | ICD-10-CM

## 2014-11-08 DIAGNOSIS — R000 Tachycardia, unspecified: Secondary | ICD-10-CM | POA: Insufficient documentation

## 2014-11-08 DIAGNOSIS — I493 Ventricular premature depolarization: Secondary | ICD-10-CM | POA: Diagnosis not present

## 2014-11-08 NOTE — Progress Notes (Signed)
Echo performed. 

## 2014-11-11 ENCOUNTER — Telehealth: Payer: Self-pay

## 2014-11-11 ENCOUNTER — Encounter: Payer: Self-pay | Admitting: Physician Assistant

## 2014-11-11 NOTE — Telephone Encounter (Signed)
spoke with pt about echo results. Pt verbalized understanding that echo was normal and to continue with current treatment plan

## 2014-11-18 ENCOUNTER — Encounter: Payer: Self-pay | Admitting: Physician Assistant

## 2014-11-25 ENCOUNTER — Other Ambulatory Visit: Payer: Self-pay

## 2014-11-25 DIAGNOSIS — Z1231 Encounter for screening mammogram for malignant neoplasm of breast: Secondary | ICD-10-CM

## 2014-12-12 ENCOUNTER — Ambulatory Visit: Payer: Self-pay

## 2014-12-19 ENCOUNTER — Ambulatory Visit
Admission: RE | Admit: 2014-12-19 | Discharge: 2014-12-19 | Disposition: A | Discharge status: Home / Self Care | Payer: Medicare Other | Source: Ambulatory Visit

## 2014-12-19 ENCOUNTER — Telehealth: Payer: Self-pay | Admitting: *Deleted

## 2014-12-19 DIAGNOSIS — Z1231 Encounter for screening mammogram for malignant neoplasm of breast: Secondary | ICD-10-CM

## 2014-12-19 LAB — HM MAMMOGRAPHY: HM MAMMO: NORMAL

## 2014-12-19 NOTE — Telephone Encounter (Signed)
Called patient to inform Dr. Harrington Challenger reviewed results of event monitor. " SR-no arrhythmias, rec-call if palpitations recur" Patient states she is still taking the beta blocker and has not noticed any further palpitations.  Has follow up with Dr. Harrington Challenger next Monday.

## 2014-12-19 NOTE — Telephone Encounter (Signed)
If feeling better doesn't need to come

## 2014-12-20 NOTE — Telephone Encounter (Signed)
Called patient. Cancelled appointment for Monday. She will call to reschedule if needed.

## 2014-12-23 ENCOUNTER — Ambulatory Visit: Payer: Medicare Other | Admitting: Internal Medicine

## 2014-12-24 DIAGNOSIS — H0019 Chalazion unspecified eye, unspecified eyelid: Secondary | ICD-10-CM | POA: Diagnosis not present

## 2014-12-24 DIAGNOSIS — L570 Actinic keratosis: Secondary | ICD-10-CM | POA: Diagnosis not present

## 2015-01-07 DIAGNOSIS — H0012 Chalazion right lower eyelid: Secondary | ICD-10-CM | POA: Diagnosis not present

## 2015-01-14 DIAGNOSIS — H43813 Vitreous degeneration, bilateral: Secondary | ICD-10-CM | POA: Diagnosis not present

## 2015-01-14 DIAGNOSIS — H0012 Chalazion right lower eyelid: Secondary | ICD-10-CM | POA: Diagnosis not present

## 2015-01-14 DIAGNOSIS — H2513 Age-related nuclear cataract, bilateral: Secondary | ICD-10-CM | POA: Diagnosis not present

## 2015-02-19 ENCOUNTER — Encounter: Payer: Self-pay | Admitting: Internal Medicine

## 2015-02-19 ENCOUNTER — Other Ambulatory Visit (INDEPENDENT_AMBULATORY_CARE_PROVIDER_SITE_OTHER): Payer: Medicare Other

## 2015-02-19 ENCOUNTER — Ambulatory Visit (INDEPENDENT_AMBULATORY_CARE_PROVIDER_SITE_OTHER): Payer: Medicare Other | Admitting: Internal Medicine

## 2015-02-19 VITALS — BP 126/72 | HR 72 | Temp 98.0°F | Resp 16 | Ht 60.0 in | Wt 132.0 lb

## 2015-02-19 DIAGNOSIS — IMO0001 Reserved for inherently not codable concepts without codable children: Secondary | ICD-10-CM

## 2015-02-19 DIAGNOSIS — E038 Other specified hypothyroidism: Secondary | ICD-10-CM

## 2015-02-19 DIAGNOSIS — G44221 Chronic tension-type headache, intractable: Secondary | ICD-10-CM

## 2015-02-19 DIAGNOSIS — F410 Panic disorder [episodic paroxysmal anxiety] without agoraphobia: Secondary | ICD-10-CM | POA: Diagnosis not present

## 2015-02-19 DIAGNOSIS — R209 Unspecified disturbances of skin sensation: Secondary | ICD-10-CM | POA: Diagnosis not present

## 2015-02-19 LAB — CBC WITH DIFFERENTIAL/PLATELET
BASOS PCT: 0.8 % (ref 0.0–3.0)
Basophils Absolute: 0 10*3/uL (ref 0.0–0.1)
EOS PCT: 1.6 % (ref 0.0–5.0)
Eosinophils Absolute: 0.1 10*3/uL (ref 0.0–0.7)
HCT: 43.4 % (ref 36.0–46.0)
HEMOGLOBIN: 14.7 g/dL (ref 12.0–15.0)
LYMPHS PCT: 35.9 % (ref 12.0–46.0)
Lymphs Abs: 1.8 10*3/uL (ref 0.7–4.0)
MCHC: 34 g/dL (ref 30.0–36.0)
MCV: 91.6 fl (ref 78.0–100.0)
Monocytes Absolute: 0.4 10*3/uL (ref 0.1–1.0)
Monocytes Relative: 8.7 % (ref 3.0–12.0)
Neutro Abs: 2.7 10*3/uL (ref 1.4–7.7)
Neutrophils Relative %: 53 % (ref 43.0–77.0)
Platelets: 232 10*3/uL (ref 150.0–400.0)
RBC: 4.74 Mil/uL (ref 3.87–5.11)
RDW: 14 % (ref 11.5–15.5)
WBC: 5.1 10*3/uL (ref 4.0–10.5)

## 2015-02-19 LAB — BASIC METABOLIC PANEL
BUN: 9 mg/dL (ref 6–23)
CO2: 30 mEq/L (ref 19–32)
CREATININE: 0.86 mg/dL (ref 0.40–1.20)
Calcium: 9.5 mg/dL (ref 8.4–10.5)
Chloride: 105 mEq/L (ref 96–112)
GFR: 68.72 mL/min (ref >60.00)
GLUCOSE: 91 mg/dL (ref 70–99)
Potassium: 4.6 mEq/L (ref 3.5–5.1)
SODIUM: 138 meq/L (ref 135–145)

## 2015-02-19 LAB — TSH: TSH: 1.06 u[IU]/mL (ref 0.35–4.50)

## 2015-02-19 MED ORDER — NORTRIPTYLINE HCL 10 MG PO CAPS: 10.0000 mg | ORAL_CAPSULE | Freq: Every day | ORAL | Status: DC

## 2015-02-19 MED ORDER — ALPRAZOLAM 0.25 MG PO TABS: 0.2500 mg | ORAL_TABLET | Freq: Three times a day (TID) | ORAL | Status: DC | PRN

## 2015-02-19 NOTE — Progress Notes (Signed)
Pre visit review using our clinic review tool, if applicable. No additional management support is needed unless otherwise documented below in the visit note. 

## 2015-02-19 NOTE — Patient Instructions (Signed)

## 2015-02-20 ENCOUNTER — Encounter: Payer: Self-pay | Admitting: Internal Medicine

## 2015-02-21 NOTE — Assessment & Plan Note (Signed)
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 Will get an updated MRI of the brain to see if there has been a new lesion. 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 She will try to improve her sleep quality and quantity.

## 2015-02-21 NOTE — Assessment & Plan Note (Signed)
Her only symptom is the left face Will get an updated MRI done to see if there has been a change

## 2015-02-21 NOTE — Progress Notes (Signed)
Subjective:    Patient ID: [name], female    DOB: 1941-05-15, 74 y.o.   MRN: [id number]  HPI Comments: She complains of recurrent headaches over the last month - she localizes the pain to the top/right side of her head and she describes it as a "pressure and tightness" with a "drawing" sensation on the left side of her face. She is concerned that she may have had a small stroke.  Hypertension Associated symptoms include headaches. Pertinent negatives include no chest pain, palpitations or shortness of breath.  Headache  Pertinent negatives include no abdominal pain, coughing, dizziness, fever, nausea, numbness, photophobia, seizures, tinnitus, vomiting or weakness. Her past medical history is significant for hypertension.      Review of Systems  Constitutional: Negative.  Negative for fever, chills, diaphoresis, appetite change and fatigue.  HENT: Negative for congestion, ear discharge, facial swelling, tinnitus, trouble swallowing and voice change.   Eyes: Negative.  Negative for photophobia and visual disturbance.  Respiratory: Negative.  Negative for cough, choking, chest tightness, shortness of breath and stridor.   Cardiovascular: Negative.  Negative for chest pain, palpitations and leg swelling.  Gastrointestinal: Negative.  Negative for nausea, vomiting, abdominal pain, diarrhea, constipation and blood in stool.  Endocrine: Negative.   Genitourinary: Negative.   Musculoskeletal: Negative.   Skin: Negative.  Negative for rash.  Allergic/Immunologic: Negative.   Neurological: Positive for facial asymmetry and headaches. Negative for dizziness, tremors, seizures, syncope, speech difficulty, weakness, light-headedness and numbness.  Hematological: Negative.  Negative for adenopathy. Does not bruise/bleed easily.  Psychiatric/Behavioral: Positive for sleep disturbance. Negative for suicidal ideas, hallucinations, behavioral problems, confusion, self-injury, dysphoric mood,  decreased concentration and agitation. The patient is nervous/anxious. The patient is not hyperactive.        Objective:   Physical Exam  Constitutional: She is oriented to person, place, and time. She appears well-developed and well-nourished. No distress.  HENT:  Head: Normocephalic and atraumatic.  Mouth/Throat: Oropharynx is clear and moist. No oropharyngeal exudate.  Eyes: Conjunctivae and EOM are normal. Pupils are equal, round, and reactive to light. Right eye exhibits no discharge. Left eye exhibits no discharge. No scleral icterus.  Neck: Normal range of motion. Neck supple. No JVD present. No tracheal deviation present. No thyromegaly present.  Cardiovascular: Normal rate, regular rhythm, normal heart sounds and intact distal pulses.  Exam reveals no gallop and no friction rub.   No murmur heard. Pulmonary/Chest: Effort normal and breath sounds normal. No stridor. No respiratory distress. She has no wheezes. She has no rales. She exhibits no tenderness.  Abdominal: Soft. Bowel sounds are normal. She exhibits no distension and no mass. There is no tenderness. There is no rebound and no guarding.  Musculoskeletal: Normal range of motion. She exhibits no edema or tenderness.  Lymphadenopathy:    She has no cervical adenopathy.  Neurological: She is alert and oriented to person, place, and time. She has normal strength. She displays no atrophy, no tremor and normal reflexes. No cranial nerve deficit or sensory deficit. She exhibits normal muscle tone. She displays a negative Romberg sign. She displays no seizure activity. Coordination and gait normal. She displays no Babinski's sign on the right side. She displays no Babinski's sign on the left side.  Reflex Scores:      Tricep reflexes are 1+ on the right side and 1+ on the left side.      Bicep reflexes are 1+ on the right side and 1+ on the left side.  Brachioradialis reflexes are 1+ on the right side and 1+ on the left side.       Patellar reflexes are 1+ on the right side and 1+ on the left side.      Achilles reflexes are 1+ on the right side and 1+ on the left side. Skin: Skin is warm and dry. No rash noted. She is not diaphoretic. No erythema. No pallor.  Vitals reviewed.   Lab Results  Component Value Date   WBC 5.1 02/19/2015   HGB 14.7 02/19/2015   HCT 43.4 02/19/2015   PLT 232.0 02/19/2015   GLUCOSE 91 02/19/2015   CHOL 212* 07/02/2014   TRIG 54.0 07/02/2014   HDL 66.00 07/02/2014   LDLDIRECT 130.0 08/03/2011   LDLCALC 135* 07/02/2014   ALT 15 10/13/2014   AST 19 10/13/2014   NA 138 02/19/2015   K 4.6 02/19/2015   CL 105 02/19/2015   CREATININE 0.86 02/19/2015   BUN 9 02/19/2015   CO2 30 02/19/2015   TSH 1.06 02/19/2015   HGBA1C 5.4 09/30/2014        Assessment & Plan:

## 2015-02-21 NOTE — Assessment & Plan Note (Signed)
Her TSh is in the normal range Will cont the current synthroid dose 

## 2015-02-21 NOTE — Assessment & Plan Note (Signed)
She will cont xanax as needed Will add on pamelor for additional symptom relief

## 2015-03-17 DIAGNOSIS — H43813 Vitreous degeneration, bilateral: Secondary | ICD-10-CM | POA: Diagnosis not present

## 2015-03-17 DIAGNOSIS — H2513 Age-related nuclear cataract, bilateral: Secondary | ICD-10-CM | POA: Diagnosis not present

## 2015-03-17 DIAGNOSIS — H0012 Chalazion right lower eyelid: Secondary | ICD-10-CM | POA: Diagnosis not present

## 2015-04-01 ENCOUNTER — Ambulatory Visit
Admission: RE | Admit: 2015-04-01 | Discharge: 2015-04-01 | Disposition: A | Discharge status: Home / Self Care | Payer: Medicare Other | Source: Ambulatory Visit | Attending: Internal Medicine | Admitting: Internal Medicine

## 2015-04-01 DIAGNOSIS — G44221 Chronic tension-type headache, intractable: Secondary | ICD-10-CM

## 2015-04-01 DIAGNOSIS — R51 Headache: Secondary | ICD-10-CM | POA: Diagnosis not present

## 2015-04-01 DIAGNOSIS — R209 Unspecified disturbances of skin sensation: Principal | ICD-10-CM

## 2015-04-01 DIAGNOSIS — IMO0001 Reserved for inherently not codable concepts without codable children: Secondary | ICD-10-CM

## 2015-04-06 ENCOUNTER — Encounter: Payer: Self-pay | Admitting: Internal Medicine

## 2015-04-07 ENCOUNTER — Other Ambulatory Visit: Payer: Self-pay | Admitting: Internal Medicine

## 2015-04-07 DIAGNOSIS — R51 Headache: Secondary | ICD-10-CM

## 2015-04-07 DIAGNOSIS — IMO0001 Reserved for inherently not codable concepts without codable children: Secondary | ICD-10-CM

## 2015-04-07 DIAGNOSIS — R519 Headache, unspecified: Secondary | ICD-10-CM

## 2015-04-07 DIAGNOSIS — R209 Unspecified disturbances of skin sensation: Principal | ICD-10-CM

## 2015-04-09 ENCOUNTER — Ambulatory Visit (INDEPENDENT_AMBULATORY_CARE_PROVIDER_SITE_OTHER): Payer: Medicare Other | Admitting: Neurology

## 2015-04-09 ENCOUNTER — Encounter: Payer: Self-pay | Admitting: Neurology

## 2015-04-09 VITALS — BP 158/84 | HR 60 | Ht 60.0 in | Wt 133.4 lb

## 2015-04-09 DIAGNOSIS — G43109 Migraine with aura, not intractable, without status migrainosus: Secondary | ICD-10-CM

## 2015-04-09 NOTE — Progress Notes (Deleted)
Subjective:    Patient ID: [name] is a 74 y.o. female.  HPI {Common ambulatory SmartLinks:[id number]}  Review of Systems  Objective:  Neurologic Exam  Physical Exam  Assessment:   ***  Plan:   ***

## 2015-04-09 NOTE — Progress Notes (Signed)
[facility] Clinic Note - Initial Visit   Date: 04/09/2015   [name] MRN: [id number] DOB: March 05, 1941   Dear Dr. [name]:  Thank you for your kind referral of [name] for consultation of headaches and left facial numbness. Although her history is well known to you, please allow [place] to reiterate it for the purpose of our medical Little. The patient was accompanied to the clinic by self.    History of Present Illness: [name] is a 74 y.o. right-handed Caucasian female with GERD, hypothyroidism, hyperlipidemia, esophageal structure/Schatzi rings s/p dilatation, and migraines presenting for evaluation of left sided numbness.    She reports having along history of headaches since the in 1970s.  She was taking aspirin with codeine which seemed to help.  In 2002, she presented to the emergency department with left sided lack of sensation (face, arm, leg) with negative work-up.  She was told she had a TIA and was started on aspirin.  Starting around March 2016, she developed new daily right parietal headaches and sought medical care.  She reports xanax and tylenol seems to help.  Nothing exacerbates her pain.  Denies any photophobia, phonophobia, nausea/vomiting.  She had spells of lack of sensation over left side of her cheek which usually occurs in the morning and can occur with or without headaches.  The numbness usually lasts about 3-4 hours and is improved after she is up and moving around.  She takes tylenol twice per week.  She has no been on any daily preventative headache therapy.  She was given nortriptyline 10mg  at bedtime for headaches, but she was very concerned about the side effect profile, so did not start this.  She is seeing Dr. 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  Additionally, she was started on magnesium (6) tablets of 250mg  daily in  addition to magnesium infusion every two weeks for the past two weeks.     Out-side paper records, electronic medical Little, and images have been reviewed where available and summarized as:  MRI brain 04/01/2015:  1. No acute intracranial abnormality or significant interval change.   2. Stable white matter disease. This is likely within normal limits for age. Similar findings can be seen with chronic migraine type headaches or vasculitis. There is no evidence for progression.  MRi brain 07/12/2014: No acute or significant finding. No cause of headache is identified. Mild chronic small-vessel change of the cerebral hemispheric white matter, less than often seen in asymptomatic persons of this age.   US carotids 09/06/2013:  Normal carotids  EMG performed at [facility] on 08/17/2011:  Normal NCS of the left lower extremity.   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 NO ACUTE INTRACRANIAL ABNORMALITY.  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 MRA IS OTHERWISE UNREMARKABLE.  Lab Results  Component Value Date   HGBA1C 5.4 09/30/2014   Lab Results  Component Value Date   TSH 1.06 02/19/2015     Past Medical History  Diagnosis Date  . CHEST PAIN 04/30/2009  . COMMON MIGRAINE 04/30/2009  . CONSTIPATION 04/30/2009  . ESOPHAGEAL STRICTURE 04/30/2009  . GERD 04/30/2009  . HIATAL HERNIA 04/30/2009  . HYPOTHYROIDISM 04/30/2009  . Irritable bowel syndrome 04/30/2009  . RAYNAUD'S DISEASE 04/30/2009  . Hyperlipidemia   . Hx of cardiovascular  stress test     ETT-Myoview (04/2014):  No ischemia, EF 85%; Normal  . Hx of echocardiogram     Echo (12/15):  EF 55-60%, no RWMA, normal diast function, MAC, LA 33 mm    Past Surgical History  Procedure Laterality Date  . Abdominal hysterectomy  1976  . Tonsillectomy  1990  . Ectopic pregnancy surgery  1970  . Rhinoplasty  1980  . Foot surgury   1997  . Colonoscopy    . Upper gastrointestinal endoscopy       Medications:  Current Outpatient Prescriptions on File Prior to Visit  Medication Sig Dispense Refill  . ALPRAZolam (XANAX) 0.25 MG tablet Take 1 tablet (0.25 mg total) by mouth 3 (three) times daily as needed for sleep or anxiety. 65 tablet 5  . AMBULATORY NON FORMULARY MEDICATION Triest-Progesterone/Testosterone/DHEA Takes as directed    . aspirin 81 MG tablet Take 162 mg by mouth daily.    Marland Kitchen CALCIUM PO Take 1 capsule by mouth 2 (two) times a week. Takes 800 mg daily    . Cholecalciferol (VITAMIN D3) 5000 UNITS TABS Take 1 tablet by mouth daily.     . Coenzyme Q10 (CO Q 10) 10 MG CAPS Take 1 tablet by mouth daily.     . Cyanocobalamin (VITAMIN B-12 CR) 1500 MCG TBCR Take 1 tablet by mouth daily.     . metoprolol succinate (TOPROL-XL) 25 MG 24 hr tablet Take 1 tablet (25 mg total) by mouth daily. 90 tablet 1  . Omega-3 Fatty Acids (FISH OIL) 1000 MG CAPS Take 1 capsule by mouth daily.     . pantoprazole (PROTONIX) 40 MG tablet Take 1 tablet (40 mg total) by mouth 2 (two) times daily. 60 tablet 11  . thyroid (ARMOUR THYROID) 60 MG tablet Take 1 tablet (60 mg total) by mouth daily before breakfast. 90 tablet 1  . vitamin A (RA VITAMIN A) 10000 UNIT capsule Take 10,000 Units by mouth daily.      . Zinc 25 MG TABS Take 1 tablet by mouth daily.      No current facility-administered medications on file prior to visit.    Allergies: No Known Allergies  Family History: Family History  Problem Relation Age of Onset  . Heart disease Father   . Cancer Sister     breast cancer  . Diabetes Maternal Aunt   . Diabetes Maternal Uncle   . Cancer Paternal Aunt     lung cancer  . Diabetes Maternal Grandmother   . Heart disease Paternal Grandmother   . Heart disease Paternal Grandfather   . Colon cancer Neg Hx   . Heart attack Father   . Stroke Cousin   . Migraines Daughter     Social History: History   Social History  .  Marital Status: Married    Spouse Name: N/A  . Number of Children: 2  . Years of Education: N/A   Occupational History  . Sales promotion account executive    Social History Main Topics  . Smoking status: Former Smoker    Quit date: 10/27/1984  . Smokeless tobacco: Never Used     Comment: quit 1985  . Alcohol Use: 0.0 oz/week    0 Standard drinks or equivalent per week     Comment: 1-2 per week  . Drug Use: No  . Sexual Activity: Not Currently    Birth Control/ Protection: Post-menopausal   Other Topics Concern  . Not on file   Social History Narrative  Patients gets regular exercise.   Patient lives alone in a one story home with a basement.  She is a widow.  Has 2 children.  Retired from [employer].  Education: some college.    Review of Systems:  CONSTITUTIONAL: No fevers, chills, night sweats, or weight loss.   EYES: No visual changes or eye pain ENT: No hearing changes.  No history of nose bleeds.   RESPIRATORY: No cough, wheezing and shortness of breath.   CARDIOVASCULAR: Negative for chest pain, and palpitations.   GI: Negative for abdominal discomfort, blood in stools or black stools.  No recent change in bowel habits.   GU:  No history of incontinence.   MUSCLOSKELETAL: No history of joint pain or swelling.  No myalgias.   SKIN: Negative for lesions, rash, and itching.   HEMATOLOGY/ONCOLOGY: Negative for prolonged bleeding, bruising easily, and swollen nodes.  No history of cancer.   ENDOCRINE: Negative for cold or heat intolerance, polydipsia or goiter.   PSYCH:  No depression or anxiety symptoms.   NEURO: As Above.   Vital Signs:  BP 158/84 mmHg  Pulse 60  Ht 5' (1.524 m)  Wt 133 lb 6 oz (60.499 kg)  BMI 26.05 kg/m2  SpO2 96%   General Medical Exam:   General:  Well appearing, comfortable.   Eyes/ENT: see cranial nerve examination.   Neck: No masses appreciated.  Full range of motion without tenderness.  No carotid bruits. Respiratory:  Clear to  auscultation, good air entry bilaterally.   Cardiac:  Regular rate and rhythm, no murmur.   Extremities:  No deformities, edema, or skin discoloration.  Skin:  No rashes or lesions.  Neurological Exam: MENTAL STATUS including orientation to time, place, person, recent and remote memory, attention span and concentration, language, and fund of knowledge is normal.  Speech is not dysarthric.  CRANIAL NERVES: II:  No visual field defects.  Unremarkable fundi.   III-IV-VI: Pupils equal round and reactive to light.  Normal conjugate, extra-ocular eye movements in all directions of gaze.  No nystagmus.  No ptosis.   V:  Normal facial sensation.    VII:  Normal facial symmetry and movements.  No pathologic facial reflexes.  VIII:  Normal hearing and vestibular function.   IX-X:  Normal palatal movement.   XI:  Normal shoulder shrug and head rotation.   XII:  Normal tongue strength and range of motion, no deviation or fasciculation.  MOTOR:  No atrophy, fasciculations or abnormal movements.  No pronator drift.  Tone is normal.    Right Upper Extremity:    Left Upper Extremity:    Deltoid  5/5   Deltoid  5/5   Biceps  5/5   Biceps  5/5   Triceps  5/5   Triceps  5/5   Wrist extensors  5/5   Wrist extensors  5/5   Wrist flexors  5/5   Wrist flexors  5/5   Finger extensors  5/5   Finger extensors  5/5   Finger flexors  5/5   Finger flexors  5/5   Dorsal interossei  5/5   Dorsal interossei  5/5   Abductor pollicis  5/5   Abductor pollicis  5/5   Tone (Ashworth scale)  0  Tone (Ashworth scale)  0   Right Lower Extremity:    Left Lower Extremity:    Hip flexors  5/5   Hip flexors  5/5   Hip extensors  5/5   Hip extensors  5/5  Knee flexors  5/5   Knee flexors  5/5   Knee extensors  5/5   Knee extensors  5/5   Dorsiflexors  5/5   Dorsiflexors  5/5   Plantarflexors  5/5   Plantarflexors  5/5   Toe extensors  5/5   Toe extensors  5/5   Toe flexors  5/5   Toe flexors  5/5   Tone (Ashworth  scale)  0  Tone (Ashworth scale)  0   MSRs:  Right                                                                 Left brachioradialis 2+  brachioradialis 2+  biceps 2+  biceps 2+  triceps 2+  triceps 2+  patellar 2+  patellar 2+  ankle jerk 2+  ankle jerk 2+  Hoffman no  Hoffman no  plantar response down  plantar response down   SENSORY:  Normal and symmetric perception of light touch, pinprick, vibration, and proprioception.  Romberg's sign absent.   COORDINATION/GAIT: Normal finger-to- nose-finger and heel-to-shin.  Intact rapid alternating movements bilaterally.  Able to rise from a chair without using arms.  Gait narrow based and stable. Tandem and stressed gait intact.    IMPRESSION/PLAN: [name] is a 74 year-old female presenting for evaluation of right parietal headaches with associated left facial dysesthesias.  Her exam is non-focal.  I have reviewed pervious imaging including her most recent MRI brain from 04/01/2015 which does not show any worrisome findings.  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  She has not started any preventative therapy due to concern of side effects.  I encouraged her to take nortriptyline 10mg  qhs as this is a low dose and effective for headache treatment.  Because she is also seeing alternative therapy and taking large dose of magnesium daily, I encouraged her to be sure her magnesium levels are being closely monitored.  She will call with an update in 1 month to determine further titration of nortriptyline.  Return to clinic in 2-3 months   The duration of this appointment visit was 40 minutes of face-to-face time with the patient.  Greater than 50% of this time was spent in counseling, explanation of diagnosis, planning of further management, and coordination of care.   Thank you for allowing me to participate in patient's care.  If I can answer any additional questions, I would be pleased to  do so.    Sincerely,    Donika K. Posey Pronto, DO

## 2015-04-09 NOTE — Patient Instructions (Signed)
1.  Start nortriptyline 10mg  at bedtime 2.  Please be sure your magnesium levels are followed closely 3.  Call me with an update in 1 month 4.  Return to clinic 2-3 months

## 2015-04-19 ENCOUNTER — Emergency Department (HOSPITAL_COMMUNITY)
Admission: EM | Admit: 2015-04-19 | Discharge: 2015-04-19 | Disposition: A | Discharge status: Home / Self Care | Payer: Medicare Other | Attending: Emergency Medicine | Admitting: Emergency Medicine

## 2015-04-19 ENCOUNTER — Encounter (HOSPITAL_COMMUNITY): Payer: Self-pay

## 2015-04-19 DIAGNOSIS — I951 Orthostatic hypotension: Secondary | ICD-10-CM

## 2015-04-19 DIAGNOSIS — K219 Gastro-esophageal reflux disease without esophagitis: Secondary | ICD-10-CM | POA: Insufficient documentation

## 2015-04-19 DIAGNOSIS — R51 Headache: Secondary | ICD-10-CM | POA: Insufficient documentation

## 2015-04-19 DIAGNOSIS — Z8639 Personal history of other endocrine, nutritional and metabolic disease: Secondary | ICD-10-CM | POA: Insufficient documentation

## 2015-04-19 DIAGNOSIS — Z79899 Other long term (current) drug therapy: Secondary | ICD-10-CM | POA: Diagnosis not present

## 2015-04-19 DIAGNOSIS — R42 Dizziness and giddiness: Secondary | ICD-10-CM | POA: Diagnosis not present

## 2015-04-19 DIAGNOSIS — Z7982 Long term (current) use of aspirin: Secondary | ICD-10-CM | POA: Diagnosis not present

## 2015-04-19 DIAGNOSIS — E039 Hypothyroidism, unspecified: Secondary | ICD-10-CM | POA: Insufficient documentation

## 2015-04-19 DIAGNOSIS — Z87891 Personal history of nicotine dependence: Secondary | ICD-10-CM | POA: Diagnosis not present

## 2015-04-19 DIAGNOSIS — R519 Headache, unspecified: Secondary | ICD-10-CM

## 2015-04-19 DIAGNOSIS — Z8679 Personal history of other diseases of the circulatory system: Secondary | ICD-10-CM | POA: Insufficient documentation

## 2015-04-19 DIAGNOSIS — R404 Transient alteration of awareness: Secondary | ICD-10-CM | POA: Diagnosis not present

## 2015-04-19 LAB — URINALYSIS, ROUTINE W REFLEX MICROSCOPIC
BILIRUBIN URINE: NEGATIVE
Glucose, UA: NEGATIVE mg/dL
KETONES UR: 15 mg/dL — AB
Leukocytes, UA: NEGATIVE
Nitrite: NEGATIVE
PH: 6 (ref 5.0–8.0)
PROTEIN: NEGATIVE mg/dL
Specific Gravity, Urine: 1.01 (ref 1.005–1.030)
Urobilinogen, UA: 0.2 mg/dL (ref 0.0–1.0)

## 2015-04-19 LAB — CBC WITH DIFFERENTIAL/PLATELET
BASOS ABS: 0 10*3/uL (ref 0.0–0.1)
BASOS PCT: 1 % (ref 0–1)
EOS ABS: 0 10*3/uL (ref 0.0–0.7)
Eosinophils Relative: 0 % (ref 0–5)
HCT: 38.8 % (ref 36.0–46.0)
HEMOGLOBIN: 13.5 g/dL (ref 12.0–15.0)
Lymphocytes Relative: 16 % (ref 12–46)
Lymphs Abs: 0.7 10*3/uL (ref 0.7–4.0)
MCH: 31.4 pg (ref 26.0–34.0)
MCHC: 34.8 g/dL (ref 30.0–36.0)
MCV: 90.2 fL (ref 78.0–100.0)
MONO ABS: 0.3 10*3/uL (ref 0.1–1.0)
MONOS PCT: 6 % (ref 3–12)
NEUTROS ABS: 3.6 10*3/uL (ref 1.7–7.7)
Neutrophils Relative %: 77 % (ref 43–77)
PLATELETS: 204 10*3/uL (ref 150–400)
RBC: 4.3 MIL/uL (ref 3.87–5.11)
RDW: 12.9 % (ref 11.5–15.5)
WBC: 4.7 10*3/uL (ref 4.0–10.5)

## 2015-04-19 LAB — I-STAT CHEM 8, ED
BUN: 9 mg/dL (ref 6–20)
CHLORIDE: 103 mmol/L (ref 101–111)
CREATININE: 0.8 mg/dL (ref 0.44–1.00)
Calcium, Ion: 1.17 mmol/L (ref 1.13–1.30)
Glucose, Bld: 105 mg/dL — ABNORMAL HIGH (ref 65–99)
HCT: 43 % (ref 36.0–46.0)
Hemoglobin: 14.6 g/dL (ref 12.0–15.0)
Potassium: 3.9 mmol/L (ref 3.5–5.1)
SODIUM: 138 mmol/L (ref 135–145)
TCO2: 21 mmol/L (ref 0–100)

## 2015-04-19 LAB — I-STAT TROPONIN, ED: Troponin i, poc: 0.01 ng/mL (ref 0.00–0.08)

## 2015-04-19 LAB — URINE MICROSCOPIC-ADD ON

## 2015-04-19 MED ORDER — ACETAMINOPHEN 325 MG PO TABS
650.0000 mg | ORAL_TABLET | Freq: Once | ORAL | Status: AC
  Administered 2015-04-19: 650 mg via ORAL
  Filled 2015-04-19: qty 2

## 2015-04-19 MED ORDER — SODIUM CHLORIDE 0.9 % IV BOLUS (SEPSIS)
500.0000 mL | Freq: Once | INTRAVENOUS | Status: AC
  Administered 2015-04-19: 500 mL via INTRAVENOUS

## 2015-04-19 MED ORDER — METOCLOPRAMIDE HCL 5 MG/ML IJ SOLN
5.0000 mg | Freq: Once | INTRAMUSCULAR | Status: AC
  Administered 2015-04-19: 5 mg via INTRAVENOUS
  Filled 2015-04-19: qty 2

## 2015-04-19 MED ORDER — KETOROLAC TROMETHAMINE 30 MG/ML IJ SOLN
15.0000 mg | Freq: Once | INTRAMUSCULAR | Status: AC
  Administered 2015-04-19: 15 mg via INTRAVENOUS
  Filled 2015-04-19: qty 1

## 2015-04-19 MED ORDER — ALPRAZOLAM 0.25 MG PO TABS
0.2500 mg | ORAL_TABLET | Freq: Once | ORAL | Status: AC
  Administered 2015-04-19: 0.25 mg via ORAL
  Filled 2015-04-19: qty 1

## 2015-04-19 NOTE — ED Notes (Signed)
PA at bedside.

## 2015-04-19 NOTE — ED Provider Notes (Signed)
CLINICAL DATA: Headaches superiorly and on the right. Left-sided face feels cold at times.  EXAM: MRI HEAD WITHOUT CONTRAST  TECHNIQUE: Multiplanar, multiecho pulse sequences of the brain and surrounding structures were obtained without intravenous contrast.  COMPARISON: MRI brain 07/12/2014.  FINDINGS: The diffusion-weighted images demonstrate no evidence for acute or subacute infarction. Mild atrophy and white matter changes within normal limits for age. Findings are similar to the prior exam. There is no significant interval change. The ventricles are proportionate to the degree of atrophy. Insert pass fluid  Flow is present in the major intracranial arteries. The globes and orbits are intact. The paranasal sinuses and mastoid air cells are clear.  The skullbase is within normal limits. Midline structures are normal.  IMPRESSION: 1. No acute intracranial abnormality or significant interval change. 2. Stable white matter disease. This is likely within normal limits for age. Similar findings can be seen with chronic migraine type headaches or vasculitis. There is no evidence for progression.   Electronically Signed  By: San Morelle M.D.  On: 04/01/2015 11:22    ED ECG REPORT   Date: 04/19/2015  Rate: 80  Rhythm: normal sinus rhythm  QRS Axis: normal  Intervals: normal  ST/T Wave abnormalities: normal  Conduction Disutrbances:none  Narrative Interpretation:   Old EKG Reviewed: unchanged  I have personally reviewed the EKG tracing and agree with the computerized printout as noted.  Medical screening examination/treatment/procedure(s) were conducted as a shared visit with non-physician practitioner(s) and myself.  I personally evaluated the patient during the encounter.   EKG Interpretation   Date/Time:  Saturday Apr 19 2015 07:06:17 EDT Ventricular Rate:  80 PR Interval:  187 QRS Duration: 67 QT Interval:  375 QTC Calculation:  433 R Axis:   49 Text Interpretation:  Age not entered, assumed to be  74 years old for  purpose of ECG interpretation Sinus rhythm ED PHYSICIAN INTERPRETATION  AVAILABLE IN [place] Confirmed by TEST, Record ([id number]) on  04/21/2015 8:36:00 AM      Pt with positional lightheadeness, normal neuro exam. Reecnt nml MRI. Doubt CVA/TIA, SAH.    Ernestina Patches, MD 04/22/15 [phone]

## 2015-04-19 NOTE — ED Notes (Signed)
EDP at bedside  

## 2015-04-19 NOTE — Discharge Instructions (Signed)
Return to the emergency room with worsening of symptoms, new symptoms or with symptoms that are concerning , especially severe worsening of headache, visual or speech changes, weakness in face, arms or legs. Please call your doctor for a followup appointment within 24-48 hours. When you talk to your doctor please let them know that you were seen in the emergency department and have them acquire all of your records so that they can discuss the findings with you and formulate a treatment plan to fully care for your new and ongoing problems. Read below information and follow recommendations.  Dizziness Dizziness is a common problem. It is a feeling of unsteadiness or light-headedness. You may feel like you are about to faint. Dizziness can lead to injury if you stumble or fall. A person of any age group can suffer from dizziness, but dizziness is more common in older adults. CAUSES  Dizziness can be caused by many different things, including:  Middle ear problems.  Standing for too long.  Infections.  An allergic reaction.  Aging.  An emotional response to something, such as the sight of blood.  Side effects of medicines.  Tiredness.  Problems with circulation or blood pressure.  Excessive use of alcohol or medicines, or illegal drug use.  Breathing too fast (hyperventilation).  An irregular heart rhythm (arrhythmia).  A low red blood cell count (anemia).  Pregnancy.  Vomiting, diarrhea, fever, or other illnesses that cause body fluid loss (dehydration).  Diseases or conditions such as Parkinson's disease, high blood pressure (hypertension), diabetes, and thyroid problems.  Exposure to extreme heat. DIAGNOSIS  Your health care provider will ask about your symptoms, perform a physical exam, and perform an electrocardiogram (ECG) to record the electrical activity of your heart. Your health care provider may also perform other heart or blood tests to determine the cause of your  dizziness. These may include:  Transthoracic echocardiogram (TTE). During echocardiography, sound waves are used to evaluate how blood flows through your heart.  Transesophageal echocardiogram (TEE).  Cardiac monitoring. This allows your health care provider to monitor your heart rate and rhythm in real time.  Holter monitor. This is a portable device that records your heartbeat and can help diagnose heart arrhythmias. It allows your health care provider to track your heart activity for several days if needed.  Stress tests by exercise or by giving medicine that makes the heart beat faster. TREATMENT  Treatment of dizziness depends on the cause of your symptoms and can vary greatly. HOME CARE INSTRUCTIONS   Drink enough fluids to keep your urine clear or pale yellow. This is especially important in very hot weather. In older adults, it is also important in cold weather.  Take your medicine exactly as directed if your dizziness is caused by medicines. When taking blood pressure medicines, it is especially important to get up slowly.  Rise slowly from chairs and steady yourself until you feel okay.  In the morning, first sit up on the side of the bed. When you feel okay, stand slowly while holding onto something until you know your balance is fine.  Move your legs often if you need to stand in one place for a long time. Tighten and relax your muscles in your legs while standing.  Have someone stay with you for 1-2 days if dizziness continues to be a problem. Do this until you feel you are well enough to stay alone. Have the person call your health care provider if he or she notices changes  in you that are concerning.  Do not drive or use heavy machinery if you feel dizzy.  Do not drink alcohol. SEEK IMMEDIATE MEDICAL CARE IF:   Your dizziness or light-headedness gets worse.  You feel nauseous or vomit.  You have problems talking, walking, or using your arms, hands, or legs.  You  feel weak.  You are not thinking clearly or you have trouble forming sentences. It may take a friend or family member to notice this.  You have chest pain, abdominal pain, shortness of breath, or sweating.  Your vision changes.  You notice any bleeding.  You have side effects from medicine that seems to be getting worse rather than better. MAKE SURE YOU:   Understand these instructions.  Will watch your condition.  Will get help right away if you are not doing well or get worse. Document Released: 05/04/2001 Document Revised: 11/13/2013 Document Reviewed: 05/28/2011 [facility] Patient Information 2015 [place], [place]. This information is not intended to replace advice given to you by your health care provider. Make sure you discuss any questions you have with your health care provider.

## 2015-04-19 NOTE — ED Provider Notes (Signed)
CSN: [id number]     Arrival date & time 04/19/15  9518 History   First MD Initiated Contact with Patient 04/19/15 0636     Chief Complaint  Patient presents with  . Headache  . Dizziness     (Consider location/radiation/quality/duration/timing/severity/associated sxs/prior Treatment) HPI  [name] is a 74 y.o. female with PMH of Headaches presenting with headache that right sided that started while she is laying flat on her bed changing slacks last night and pt felt "woozy". Pt denies dizziness, lightheadedness at this time.  Headache developed gradually and is like other headaches she's had in severity just different location. Patient has had history of headaches they're occipital. No fevers chills no neck pain head injury or loss of consciousness. No visual changes slurred speech or weakness. No nausea or vomiting. Patient had recent MRI and workup neurology's unremarkable one week ago.   Past Medical History  Diagnosis Date  . CHEST PAIN 04/30/2009  . COMMON MIGRAINE 04/30/2009  . CONSTIPATION 04/30/2009  . ESOPHAGEAL STRICTURE 04/30/2009  . GERD 04/30/2009  . HIATAL HERNIA 04/30/2009  . HYPOTHYROIDISM 04/30/2009  . Irritable bowel syndrome 04/30/2009  . RAYNAUD'S DISEASE 04/30/2009  . Hyperlipidemia   . Hx of cardiovascular stress test     ETT-Myoview (04/2014):  No ischemia, EF 85%; Normal  . Hx of echocardiogram     Echo (12/15):  EF 55-60%, no RWMA, normal diast function, MAC, LA 33 mm   Past Surgical History  Procedure Laterality Date  . Abdominal hysterectomy  1976  . Tonsillectomy  1990  . Ectopic pregnancy surgery  1970  . Rhinoplasty  1980  . Foot surgury  1997  . Colonoscopy    . Upper gastrointestinal endoscopy     Family History  Problem Relation Age of Onset  . Heart disease Father   . Cancer Sister     breast cancer  . Diabetes Maternal Aunt   . Diabetes Maternal Uncle   . Cancer Paternal Aunt     lung cancer  . Diabetes Maternal Grandmother   . Heart disease  Paternal Grandmother   . Heart disease Paternal Grandfather   . Colon cancer Neg Hx   . Heart attack Father   . Stroke Cousin   . Migraines Daughter    History  Substance Use Topics  . Smoking status: Former Smoker    Quit date: 10/27/1984  . Smokeless tobacco: Never Used     Comment: quit 1985  . Alcohol Use: 0.0 oz/week    0 Standard drinks or equivalent per week     Comment: 1-2 per week   OB History    No data available     Review of Systems 10 Systems reviewed and are negative for acute change except as noted in the HPI.    Allergies  Review of patient's allergies indicates no known allergies.  Home Medications   Prior to Admission medications   Medication Sig Start Date End Date Taking? Authorizing Provider  ALPRAZolam (XANAX) 0.25 MG tablet Take 1 tablet (0.25 mg total) by mouth 3 (three) times daily as needed for sleep or anxiety. 02/19/15  Yes Janith Lima, MD  aspirin 81 MG tablet Take 162 mg by mouth daily.   Yes Historical Provider, MD  Biotin 5000 MCG CAPS Take 5,000 capsules by mouth daily.    Yes Historical Provider, MD  CALCIUM PO Take 1-2 capsules by mouth at bedtime.    Yes Historical Provider, MD  Cholecalciferol (VITAMIN D3) 5000  UNITS TABS Take 1 tablet by mouth daily.    Yes Historical Provider, MD  Coenzyme Q10 (CO Q 10) 10 MG CAPS Take 1 tablet by mouth daily.    Yes Historical Provider, MD  Cyanocobalamin (VITAMIN B-12 CR) 1500 MCG TBCR Take 1 tablet by mouth 3 (three) times a week.    Yes Historical Provider, MD  MAGNESIUM OXIDE PO Take 4 tablets by mouth 2 (two) times daily.    Yes Historical Provider, MD  metoprolol succinate (TOPROL-XL) 25 MG 24 hr tablet Take 1 tablet (25 mg total) by mouth daily. 10/16/14  Yes Janith Lima, MD  NONFORMULARY OR COMPOUNDED ITEM Place 0.5 Troches under the tongue daily. TRIEST (8-1-1)/PROG/TEST (POLY) (CINNAMON)   Yes Historical Provider, MD  Nutritional Supplements (DHEA PO) Take 5 mg by mouth every morning.     Yes Historical Provider, MD  Omega-3 Fatty Acids (FISH OIL) 1000 MG CAPS Take 2 capsules by mouth daily.    Yes Historical Provider, MD  pantoprazole (PROTONIX) 40 MG tablet Take 1 tablet (40 mg total) by mouth 2 (two) times daily. Patient taking differently: Take 40 mg by mouth daily as needed (acid reflux).  03/24/12  Yes Sable Feil, MD  thyroid (ARMOUR) 65 MG tablet Take 65 mg by mouth daily.   Yes Historical Provider, MD  vitamin A (RA VITAMIN A) 10000 UNIT capsule Take 10,000 Units by mouth daily.     Yes Historical Provider, MD  Zinc 25 MG TABS Take 1 tablet by mouth daily.    Yes Historical Provider, MD   BP 125/69 mmHg  Pulse 82  Temp(Src) 98.2 F (36.8 C) (Oral)  Resp 20  Ht 5' (1.524 m)  Wt 133 lb (60.328 kg)  BMI 25.97 kg/m2  SpO2 100% Physical Exam  Constitutional: She is oriented to person, place, and time. She appears well-developed and well-nourished. No distress.  HENT:  Head: Normocephalic and atraumatic.  Mouth/Throat: Oropharynx is clear and moist.  Eyes: Conjunctivae and EOM are normal. Pupils are equal, round, and reactive to light. Right eye exhibits no discharge. Left eye exhibits no discharge.  Neck: Normal range of motion. Neck supple.  No nuchal rigidity  Cardiovascular: Normal rate and regular rhythm.   Pulmonary/Chest: Effort normal and breath sounds normal. No respiratory distress. She has no wheezes.  Abdominal: Soft. Bowel sounds are normal. She exhibits no distension. There is no tenderness.  Neurological: She is alert and oriented to person, place, and time. No cranial nerve deficit. Coordination normal.  Speech is clear and goal oriented. Peripheral visual fields intact. Strength 5/5 in upper and lower extremities. Sensation intact. Intact rapid alternating movements, finger to nose, and heel to shin. Negative Romberg. No pronator drift. Normal gait.   Skin: Skin is warm and dry. She is not diaphoretic.  Nursing note and vitals  reviewed.   ED Course  Procedures (including critical care time) Labs Review Labs Reviewed  URINALYSIS, ROUTINE W REFLEX MICROSCOPIC (NOT AT [facility]) - Abnormal; Notable for the following:    Hgb urine dipstick SMALL (*)    Ketones, ur 15 (*)    All other components within normal limits  URINE MICROSCOPIC-ADD ON - Abnormal; Notable for the following:    Squamous Epithelial / LPF MANY (*)    Bacteria, UA FEW (*)    All other components within normal limits  I-STAT CHEM 8, ED - Abnormal; Notable for the following:    Glucose, Bld 105 (*)    All other components  within normal limits  CBC WITH DIFFERENTIAL/PLATELET  I-STAT TROPOININ, ED    Imaging Review No results found.   EKG Interpretation None      MDM   Final diagnoses:  Orthostasis  Nonintractable headache   Lightheadheded is transient, worsened with movement, and abrupt in onset.  No  visual or speech changes, or weakness. Pt without  Afib or prior CVA. Pt with headache that is like other headache she's had before just a new location. Not worst of life, not maximal in intensity or at onset. VSS. Patient with completely benign neurological exam. Patient orthostatic. I doubt central vertigo due to presentation, benign exam, improvement in ED. for patient's headache, she's had negative MRI last week completely normal neurological exam. I doubt subarachnoid, intracranial hemorrhage, meningitis, giant cell. Patient is afebrile, nontoxic, and in no acute distress. Patient is appropriate for outpatient management and is stable for discharge. Patient to follow up with PCP.   Discussed return precautions with patient. Discussed all results and patient verbalizes understanding and agrees with plan.   This is a shared patient. This patient was discussed with the physician who saw and evaluated the patient and agrees with the plan.     Al Corpus, PA-C 04/19/15 1423  Ernestina Patches, MD 04/19/15 [phone]

## 2015-04-19 NOTE — ED Notes (Signed)
Pt states she was laying flat on back while changing bed slacks and she started having dizzy spells; Pt states HA started on back of head at that time; pt has hx of Migraines on the top right side of head; pt states she had normal MRI on 04/01/15 and seen Neurologist last week with no abnormal findings; Pt is ambulatory on arrival and a&0x 4.

## 2015-04-29 ENCOUNTER — Other Ambulatory Visit: Payer: Self-pay | Admitting: Dermatology

## 2015-04-29 DIAGNOSIS — L570 Actinic keratosis: Secondary | ICD-10-CM | POA: Diagnosis not present

## 2015-04-29 DIAGNOSIS — D239 Other benign neoplasm of skin, unspecified: Secondary | ICD-10-CM | POA: Diagnosis not present

## 2015-04-29 DIAGNOSIS — C44519 Basal cell carcinoma of skin of other part of trunk: Secondary | ICD-10-CM | POA: Diagnosis not present

## 2015-05-15 ENCOUNTER — Telehealth: Payer: Self-pay | Admitting: *Deleted

## 2015-05-15 NOTE — Telephone Encounter (Signed)
FYI

## 2015-05-15 NOTE — Telephone Encounter (Signed)
FYI........Marland KitchenPatient call to report that she is doing better with the nortriptyline she has had less headaches

## 2015-05-15 NOTE — Telephone Encounter (Signed)
Great, glad it's helping.

## 2015-05-30 ENCOUNTER — Other Ambulatory Visit: Payer: Self-pay | Admitting: Internal Medicine

## 2015-06-12 DIAGNOSIS — D485 Neoplasm of uncertain behavior of skin: Secondary | ICD-10-CM | POA: Diagnosis not present

## 2015-06-12 DIAGNOSIS — L820 Inflamed seborrheic keratosis: Secondary | ICD-10-CM | POA: Diagnosis not present

## 2015-06-12 DIAGNOSIS — C44519 Basal cell carcinoma of skin of other part of trunk: Secondary | ICD-10-CM | POA: Diagnosis not present

## 2015-06-12 DIAGNOSIS — L570 Actinic keratosis: Secondary | ICD-10-CM | POA: Diagnosis not present

## 2015-07-15 ENCOUNTER — Ambulatory Visit: Payer: Medicare Other | Admitting: Neurology

## 2015-07-21 ENCOUNTER — Ambulatory Visit: Payer: Medicare Other | Admitting: Internal Medicine

## 2015-07-23 ENCOUNTER — Ambulatory Visit (INDEPENDENT_AMBULATORY_CARE_PROVIDER_SITE_OTHER): Payer: Medicare Other | Admitting: Neurology

## 2015-07-23 ENCOUNTER — Encounter: Payer: Self-pay | Admitting: Neurology

## 2015-07-23 ENCOUNTER — Ambulatory Visit: Payer: Medicare Other | Admitting: Internal Medicine

## 2015-07-23 VITALS — BP 110/76 | HR 69 | Ht 60.0 in | Wt 130.2 lb

## 2015-07-23 DIAGNOSIS — G43109 Migraine with aura, not intractable, without status migrainosus: Secondary | ICD-10-CM | POA: Insufficient documentation

## 2015-07-23 NOTE — Progress Notes (Signed)
Follow-up Visit   Date: 07/23/2015    [name] MRN: [id number] DOB: 12/05/1940   Interim History: [name] is a 74 y.o. right-handed Caucasian female with GERD, hypothyroidism, hyperlipidemia, esophageal structure/Schatzi rings s/p dilatation, and migraines returning to the clinic for follow-up of migraine equivalent syndrome.  The patient was accompanied to the clinic by self.  History of present illness: She reports having along history of headaches since the in 1970s. She was taking aspirin with codeine which seemed to help. In 2002, she presented to the emergency department with left sided lack of sensation (face, arm, leg) with negative work-up. She was told she had a TIA and was started on aspirin.  Starting around March 2016, she developed new daily right parietal headaches and sought medical care. She reports xanax and tylenol seems to help. Nothing exacerbates her pain. Denies any photophobia, phonophobia, nausea/vomiting. She had spells of lack of sensation over left side of her cheek which usually occurs in the morning and can occur with or without headaches. The numbness usually lasts about 3-4 hours and is improved after she is up and moving around. She takes tylenol twice per week.  She has no been on any daily preventative headache therapy. She was given nortriptyline 10mg  at bedtime for headaches, but she was very concerned about the side effect profile, so did not start this.  She is seeing Dr. 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 Additionally, she was started on magnesium (6) tablets of 250mg  daily in addition to magnesium infusion every two weeks for the past two weeks.   UPDATE 07/23/2015:  She was started on nortriptyline 10mg  and reports having significant improvement in her headaches, because she has only had two headaches since her last visit.   She ran out of the medication last week and does not wish to restart it because of problems with weight gain.  For her episodic headaches, tylenol seems to help.  No new complaints.  Facial paresthesias has also completely resolved.   Medications:  Current Outpatient Prescriptions on File Prior to Visit  Medication Sig Dispense Refill  . ALPRAZolam (XANAX) 0.25 MG tablet Take 1 tablet (0.25 mg total) by mouth 3 (three) times daily as needed for sleep or anxiety. 65 tablet 5  . aspirin 81 MG tablet Take 162 mg by mouth daily.    . Biotin 5000 MCG CAPS Take 5,000 capsules by mouth daily.     Marland Kitchen CALCIUM PO Take 1-2 capsules by mouth at bedtime.     . Cholecalciferol (VITAMIN D3) 5000 UNITS TABS Take 1 tablet by mouth daily.     . Coenzyme Q10 (CO Q 10) 10 MG CAPS Take 1 tablet by mouth daily.     . Cyanocobalamin (VITAMIN B-12 CR) 1500 MCG TBCR Take 1 tablet by mouth 3 (three) times a week.     Marland Kitchen MAGNESIUM OXIDE PO Take 4 tablets by mouth 2 (two) times daily.     . metoprolol succinate (TOPROL-XL) 25 MG 24 hr tablet TAKE ONE TABLET BY MOUTH ONCE DAILY 90 tablet 3  . NONFORMULARY OR COMPOUNDED ITEM Place 0.5 Troches under the tongue daily. TRIEST (8-1-1)/PROG/TEST (POLY) (CINNAMON)    . Nutritional Supplements (DHEA PO) Take 5 mg by mouth every morning.     . Omega-3 Fatty Acids (FISH OIL) 1000 MG CAPS Take 2 capsules by mouth daily.     . pantoprazole (PROTONIX)  40 MG tablet Take 1 tablet (40 mg total) by mouth 2 (two) times daily. (Patient taking differently: Take 40 mg by mouth daily as needed (acid reflux). ) 60 tablet 11  . thyroid (ARMOUR) 65 MG tablet Take 65 mg by mouth daily.    . vitamin A (RA VITAMIN A) 10000 UNIT capsule Take 10,000 Units by mouth daily.      . Zinc 25 MG TABS Take 1 tablet by mouth daily.      No current facility-administered medications on file prior to visit.    Allergies: No Known Allergies  Review of Systems:  CONSTITUTIONAL: No fevers, chills, night sweats,  or weight loss.  EYES: No visual changes or eye pain ENT: No hearing changes.  No history of nose bleeds.   RESPIRATORY: No cough, wheezing and shortness of breath.   CARDIOVASCULAR: Negative for chest pain, and palpitations.   GI: Negative for abdominal discomfort, blood in stools or black stools.  No recent change in bowel habits.   GU:  No history of incontinence.   MUSCLOSKELETAL: No history of joint pain or swelling.  No myalgias.   SKIN: Negative for lesions, rash, and itching.   ENDOCRINE: Negative for cold or heat intolerance, polydipsia or goiter.   PSYCH:  No depression or anxiety symptoms.   NEURO: As Above.   Vital Signs:  BP 110/76 mmHg  Pulse 69  Ht 5' (1.524 m)  Wt 130 lb 4 oz (59.081 kg)  BMI 25.44 kg/m2  SpO2 96%  Neurological Exam: MENTAL STATUS including orientation to time, place, person, recent and remote memory, attention span and concentration, language, and fund of knowledge is normal.  Speech is not dysarthric.  CRANIAL NERVES:  Pupils equal round and reactive to light.  Normal conjugate, extra-ocular eye movements in all directions of gaze.  No ptosis. Normal facial sensation.  Face is symmetric. Palate elevates symmetrically.  Tongue is midline.  MOTOR:  Motor strength is 5/5 in all extremities.    MSRs:  Reflexes are 2+/4 throught.  COORDINATION/GAIT:  Gait narrow based and stable.   Data:  MRI brain 04/01/2015:  1. No acute intracranial abnormality or significant interval change.  2. Stable white matter disease. This is likely within normal limits for age. Similar findings can be seen with chronic migraine type headaches or vasculitis. There is no evidence for progression.  MRi brain 07/12/2014: No acute or significant finding. No cause of headache is identified. Mild chronic small-vessel change of the cerebral hemispheric white matter, less than often seen in asymptomatic persons of this age.  US carotids 09/06/2013: Normal carotids  EMG  performed at [facility] on 08/17/2011: Normal NCS of the left lower extremity.   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 NO ACUTE INTRACRANIAL ABNORMALITY.  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 MRA IS OTHERWISE UNREMARKABLE.   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  Greater than 50% of this time was spent in counseling, explanation of diagnosis, planning of further management, and coordination  of care.   Thank you for allowing me to participate in patient's care.  If I can answer any additional questions, I would be pleased to do so.    Sincerely,    Donika K. Posey Pronto, DO

## 2015-07-23 NOTE — Patient Instructions (Signed)
OK to take tylenol as needed for headaches, but no more than twice per week. If your headaches start to worsen again, please call my office to schedule an appointment.

## 2015-07-29 ENCOUNTER — Other Ambulatory Visit (INDEPENDENT_AMBULATORY_CARE_PROVIDER_SITE_OTHER): Payer: Medicare Other

## 2015-07-29 ENCOUNTER — Ambulatory Visit (INDEPENDENT_AMBULATORY_CARE_PROVIDER_SITE_OTHER): Payer: Medicare Other | Admitting: Internal Medicine

## 2015-07-29 ENCOUNTER — Encounter: Payer: Self-pay | Admitting: Internal Medicine

## 2015-07-29 VITALS — BP 118/78 | HR 83 | Temp 98.2°F | Resp 16 | Ht 60.0 in | Wt 130.0 lb

## 2015-07-29 DIAGNOSIS — R739 Hyperglycemia, unspecified: Secondary | ICD-10-CM | POA: Diagnosis not present

## 2015-07-29 DIAGNOSIS — E785 Hyperlipidemia, unspecified: Secondary | ICD-10-CM

## 2015-07-29 DIAGNOSIS — E038 Other specified hypothyroidism: Secondary | ICD-10-CM

## 2015-07-29 DIAGNOSIS — M899 Disorder of bone, unspecified: Secondary | ICD-10-CM | POA: Diagnosis not present

## 2015-07-29 DIAGNOSIS — M8580 Other specified disorders of bone density and structure, unspecified site: Secondary | ICD-10-CM

## 2015-07-29 DIAGNOSIS — R000 Tachycardia, unspecified: Secondary | ICD-10-CM | POA: Insufficient documentation

## 2015-07-29 DIAGNOSIS — Z23 Encounter for immunization: Secondary | ICD-10-CM | POA: Diagnosis not present

## 2015-07-29 LAB — BASIC METABOLIC PANEL
BUN: 12 mg/dL (ref 6–23)
CALCIUM: 9.3 mg/dL (ref 8.4–10.5)
CO2: 27 mEq/L (ref 19–32)
CREATININE: 0.78 mg/dL (ref 0.40–1.20)
Chloride: 104 mEq/L (ref 96–112)
GFR: 76.83 mL/min (ref >60.00)
Glucose, Bld: 91 mg/dL (ref 70–99)
Potassium: 4.4 mEq/L (ref 3.5–5.1)
Sodium: 139 mEq/L (ref 135–145)

## 2015-07-29 LAB — TSH: TSH: 2.31 u[IU]/mL (ref 0.35–4.50)

## 2015-07-29 LAB — LIPID PANEL
Cholesterol: 207 mg/dL — ABNORMAL HIGH (ref 0–200)
HDL: 62.4 mg/dL (ref >39.00)
LDL Cholesterol: 128 mg/dL — ABNORMAL HIGH (ref 0–99)
NonHDL: 144.17
Total CHOL/HDL Ratio: 3
Triglycerides: 83 mg/dL (ref 0.0–149.0)
VLDL: 16.6 mg/dL (ref 0.0–40.0)

## 2015-07-29 LAB — HEMOGLOBIN A1C: HEMOGLOBIN A1C: 5.3 % (ref 4.6–6.5)

## 2015-07-29 MED ORDER — CARVEDILOL 3.125 MG PO TABS
3.1250 mg | ORAL_TABLET | Freq: Two times a day (BID) | ORAL | Status: DC
Start: 2015-07-29 — End: 2015-11-27

## 2015-07-29 NOTE — Patient Instructions (Signed)

## 2015-07-29 NOTE — Progress Notes (Signed)
Pre visit review using our clinic review tool, if applicable. No additional management support is needed unless otherwise documented below in the visit note. 

## 2015-07-29 NOTE — Progress Notes (Signed)
Subjective:  Patient ID: [name], female    DOB: [birth year]  Age: 74 y.o. MRN: [id number]  CC: Hypothyroidism   HPI [name] presents for follow up on thyroid disease, she thinks that metoprolol has caused her to gain weight. She has done some reading about beta blockers and is her that Coreg does not reduce metabolism the way metoprolol does. She has decided to stop taking metoprolol. She has a history of palpitations that she describes as feeling her heart pounding at night mostly when she is trying to sleep. She saw cardiology about 6 or 8 months ago and they were concerned that she might have atrial fibrillation. Her symptoms had been well controlled with metoprolol.  Outpatient Prescriptions Prior to Visit  Medication Sig Dispense Refill  . ALPRAZolam (XANAX) 0.25 MG tablet Take 1 tablet (0.25 mg total) by mouth 3 (three) times daily as needed for sleep or anxiety. 65 tablet 5  . aspirin 81 MG tablet Take 162 mg by mouth daily.    . Biotin 5000 MCG CAPS Take 5,000 capsules by mouth daily.     Marland Kitchen CALCIUM PO Take 1-2 capsules by mouth at bedtime.     . Cholecalciferol (VITAMIN D3) 5000 UNITS TABS Take 1 tablet by mouth daily.     . Coenzyme Q10 (CO Q 10) 10 MG CAPS Take 1 tablet by mouth daily.     . Cyanocobalamin (VITAMIN B-12 CR) 1500 MCG TBCR Take 1 tablet by mouth 3 (three) times a week.     Marland Kitchen MAGNESIUM OXIDE PO Take 4 tablets by mouth 2 (two) times daily.     . NONFORMULARY OR COMPOUNDED ITEM Place 0.5 Troches under the tongue daily. TRIEST (8-1-1)/PROG/TEST (POLY) (CINNAMON)    . Nutritional Supplements (DHEA PO) Take 5 mg by mouth every morning.     . Omega-3 Fatty Acids (FISH OIL) 1000 MG CAPS Take 2 capsules by mouth daily.     . pantoprazole (PROTONIX) 40 MG tablet Take 1 tablet (40 mg total) by mouth 2 (two) times daily. (Patient taking differently: Take 40 mg by mouth daily as needed (acid reflux). ) 60 tablet 11  . thyroid (ARMOUR) 65 MG tablet Take 65 mg by  mouth daily. Nature brand    . vitamin A (RA VITAMIN A) 10000 UNIT capsule Take 10,000 Units by mouth daily.      . Zinc 25 MG TABS Take 1 tablet by mouth daily.     . metoprolol succinate (TOPROL-XL) 25 MG 24 hr tablet TAKE ONE TABLET BY MOUTH ONCE DAILY 90 tablet 3  . nortriptyline (PAMELOR) 10 MG capsule      No facility-administered medications prior to visit.    ROS Review of Systems  Constitutional: Negative.  Negative for fever, chills, diaphoresis, appetite change and fatigue.  HENT: Negative.  Negative for trouble swallowing and voice change.   Eyes: Negative.   Respiratory: Negative.  Negative for cough, choking, chest tightness, shortness of breath and stridor.   Cardiovascular: Positive for palpitations. Negative for chest pain and leg swelling.  Gastrointestinal: Negative.  Negative for abdominal pain.  Endocrine: Negative.   Genitourinary: Negative.   Musculoskeletal: Negative.  Negative for myalgias, back pain, joint swelling and arthralgias.  Skin: Negative.  Negative for rash.  Allergic/Immunologic: Negative.   Neurological: Negative.  Negative for dizziness, syncope, speech difficulty, weakness, light-headedness, numbness and headaches.  Hematological: Negative.  Negative for adenopathy. Does not bruise/bleed easily.  Psychiatric/Behavioral: Positive for sleep disturbance. Negative for  suicidal ideas, hallucinations, behavioral problems, confusion, self-injury, dysphoric mood, decreased concentration and agitation. The patient is nervous/anxious. The patient is not hyperactive.     Objective:  BP 118/78 mmHg  Pulse 83  Temp(Src) 98.2 F (36.8 C) (Oral)  Resp 16  Ht 5' (1.524 m)  Wt 130 lb (58.968 kg)  BMI 25.39 kg/m2  SpO2 94%  BP Readings from Last 3 Encounters:  07/29/15 118/78  07/23/15 110/76  04/19/15 126/71    Wt Readings from Last 3 Encounters:  07/29/15 130 lb (58.968 kg)  07/23/15 130 lb 4 oz (59.081 kg)  04/19/15 133 lb (60.328 kg)     Physical Exam  Constitutional: She is oriented to person, place, and time. No distress.  HENT:  Head: Normocephalic and atraumatic.  Mouth/Throat: Oropharynx is clear and moist. No oropharyngeal exudate.  Eyes: Conjunctivae are normal. Right eye exhibits no discharge. Left eye exhibits no discharge. No scleral icterus.  Neck: Normal range of motion. Neck supple. No JVD present. No tracheal deviation present. No thyromegaly present.  Cardiovascular: Normal rate, regular rhythm, normal heart sounds and intact distal pulses.  Exam reveals no gallop and no friction rub.   No murmur heard. Pulmonary/Chest: Effort normal and breath sounds normal. No stridor. No respiratory distress. She has no wheezes. She has no rales. She exhibits no tenderness.  Abdominal: Soft. Bowel sounds are normal. She exhibits no distension and no mass. There is no tenderness. There is no rebound and no guarding.  Musculoskeletal: Normal range of motion. She exhibits no edema or tenderness.  Lymphadenopathy:    She has no cervical adenopathy.  Neurological: She is oriented to person, place, and time.  Skin: Skin is warm and dry. No rash noted. She is not diaphoretic. No erythema. No pallor.  Psychiatric: She has a normal mood and affect. Her behavior is normal. Judgment and thought content normal.  Vitals reviewed.   Lab Results  Component Value Date   WBC 4.7 04/19/2015   HGB 13.5 04/19/2015   HGB 14.6 04/19/2015   HCT 38.8 04/19/2015   HCT 43.0 04/19/2015   PLT 204 04/19/2015   GLUCOSE 91 07/29/2015   CHOL 207* 07/29/2015   TRIG 83.0 07/29/2015   HDL 62.40 07/29/2015   LDLDIRECT 130.0 08/03/2011   LDLCALC 128* 07/29/2015   ALT 15 10/13/2014   AST 19 10/13/2014   NA 139 07/29/2015   K 4.4 07/29/2015   CL 104 07/29/2015   CREATININE 0.78 07/29/2015   BUN 12 07/29/2015   CO2 27 07/29/2015   TSH 2.31 07/29/2015   HGBA1C 5.3 07/29/2015    No results found.  Assessment & Plan:   [name] was seen  today for hypothyroidism.  Diagnoses and all orders for this visit:  Need for influenza vaccination -     Flu vaccine HIGH DOSE PF (Fluzone High dose)  Other specified hypothyroidism- her TSH is in the normal range so she will stay on the same dose of Armour Thyroid. -     Lipid panel; Future -     TSH; Future  Senile osteopenia  Hyperlipidemia with target LDL less than 130- her Framingham risk score is only 2% so I do not recommend that she start a statin. -     Lipid panel; Future -     TSH; Future  Hyperglycemia- her blood sugars are well controlled, she will continue with her lifestyle modifications. -     Basic metabolic panel; Future -     Hemoglobin A1c; Future  Tachycardia- at her insistence metoprolol has been discontinued and will change to carvedilol. She may not get the same control of heart rate that she did with metoprolol but she is comfortable with that. I started a low dose and will increase the dose if needed and tolerated. -     carvedilol (COREG) 3.125 MG tablet; Take 1 tablet (3.125 mg total) by mouth 2 (two) times daily with a meal.   I have discontinued Ms. Kinser's metoprolol succinate and nortriptyline. I am also having her start on carvedilol. Additionally, I am having her maintain her Vitamin B-12 CR, Co Q 10, Fish Oil, vitamin A, Zinc, Vitamin D3, CALCIUM PO, pantoprazole, aspirin, ALPRAZolam, Nutritional Supplements (DHEA PO), Biotin, MAGNESIUM OXIDE PO, thyroid, and NONFORMULARY OR COMPOUNDED ITEM.  Meds ordered this encounter  Medications  . carvedilol (COREG) 3.125 MG tablet    Sig: Take 1 tablet (3.125 mg total) by mouth 2 (two) times daily with a meal.    Dispense:  90 tablet    Refill:  3     Follow-up: Return in about 3 months (around 10/28/2015).  Scarlette Calico, MD

## 2015-08-25 ENCOUNTER — Other Ambulatory Visit: Payer: Self-pay | Admitting: Internal Medicine

## 2015-09-22 ENCOUNTER — Encounter: Payer: Self-pay | Admitting: Gastroenterology

## 2015-09-29 DIAGNOSIS — D239 Other benign neoplasm of skin, unspecified: Secondary | ICD-10-CM | POA: Diagnosis not present

## 2015-09-29 DIAGNOSIS — L821 Other seborrheic keratosis: Secondary | ICD-10-CM | POA: Diagnosis not present

## 2015-11-19 ENCOUNTER — Other Ambulatory Visit: Payer: Self-pay

## 2015-11-19 DIAGNOSIS — Z1231 Encounter for screening mammogram for malignant neoplasm of breast: Secondary | ICD-10-CM

## 2015-11-27 ENCOUNTER — Encounter: Payer: Self-pay | Admitting: Internal Medicine

## 2015-11-27 ENCOUNTER — Ambulatory Visit (INDEPENDENT_AMBULATORY_CARE_PROVIDER_SITE_OTHER): Payer: Medicare Other | Admitting: Internal Medicine

## 2015-11-27 ENCOUNTER — Other Ambulatory Visit (INDEPENDENT_AMBULATORY_CARE_PROVIDER_SITE_OTHER): Payer: Medicare Other

## 2015-11-27 VITALS — BP 120/68 | HR 78 | Temp 97.8°F | Resp 16 | Ht 60.0 in | Wt 129.0 lb

## 2015-11-27 DIAGNOSIS — E785 Hyperlipidemia, unspecified: Secondary | ICD-10-CM

## 2015-11-27 DIAGNOSIS — R739 Hyperglycemia, unspecified: Secondary | ICD-10-CM

## 2015-11-27 DIAGNOSIS — Z0000 Encounter for general adult medical examination without abnormal findings: Secondary | ICD-10-CM

## 2015-11-27 DIAGNOSIS — E890 Postprocedural hypothyroidism: Secondary | ICD-10-CM

## 2015-11-27 DIAGNOSIS — R000 Tachycardia, unspecified: Secondary | ICD-10-CM

## 2015-11-27 LAB — LIPID PANEL
CHOLESTEROL: 216 mg/dL — AB (ref 0–200)
HDL: 56.1 mg/dL (ref >39.00)
LDL Cholesterol: 149 mg/dL — ABNORMAL HIGH (ref 0–99)
NonHDL: 159.63
TRIGLYCERIDES: 53 mg/dL (ref 0.0–149.0)
Total CHOL/HDL Ratio: 4
VLDL: 10.6 mg/dL (ref 0.0–40.0)

## 2015-11-27 LAB — TSH: TSH: 1.77 u[IU]/mL (ref 0.35–4.50)

## 2015-11-27 MED ORDER — CARVEDILOL 3.125 MG PO TABS: 3.1250 mg | ORAL_TABLET | Freq: Two times a day (BID) | ORAL | Status: DC

## 2015-11-27 NOTE — Progress Notes (Signed)
Pre visit review using our clinic review tool, if applicable. No additional management support is needed unless otherwise documented below in the visit note. 

## 2015-11-27 NOTE — Progress Notes (Signed)
Subjective:  Patient ID: [name], female    DOB: May 04, 1941  Age: 75 y.o. MRN: [id number]  CC: Annual Exam; Hypothyroidism; and Hyperlipidemia   HPI [name] presents for complete physical as well as follow-up on thyroid disease and high cholesterol. She feels well today and offers no complaints.  Outpatient Prescriptions Prior to Visit  Medication Sig Dispense Refill  . ALPRAZolam (XANAX) 0.25 MG tablet TAKE ONE TABLET BY MOUTH THREE TIMES DAILY AS NEEDED FOR SLEEP OR ANXIETY 65 tablet 2  . aspirin 81 MG tablet Take 162 mg by mouth daily.    . Biotin 5000 MCG CAPS Take 5,000 capsules by mouth daily.     Marland Kitchen CALCIUM PO Take 1-2 capsules by mouth at bedtime.     . Cholecalciferol (VITAMIN D3) 5000 UNITS TABS Take 1 tablet by mouth daily.     . Coenzyme Q10 (CO Q 10) 10 MG CAPS Take 1 tablet by mouth daily.     . Cyanocobalamin (VITAMIN B-12 CR) 1500 MCG TBCR Take 1 tablet by mouth 3 (three) times a week.     Marland Kitchen MAGNESIUM OXIDE PO Take 4 tablets by mouth 2 (two) times daily.     . NONFORMULARY OR COMPOUNDED ITEM Place 0.5 Troches under the tongue daily. TRIEST (8-1-1)/PROG/TEST (POLY) (CINNAMON)    . Nutritional Supplements (DHEA PO) Take 5 mg by mouth every morning.     . Omega-3 Fatty Acids (FISH OIL) 1000 MG CAPS Take 2 capsules by mouth daily.     . pantoprazole (PROTONIX) 40 MG tablet Take 1 tablet (40 mg total) by mouth 2 (two) times daily. (Patient taking differently: Take 40 mg by mouth daily as needed (acid reflux). ) 60 tablet 11  . thyroid (ARMOUR) 65 MG tablet Take 65 mg by mouth daily. Nature brand    . vitamin A (RA VITAMIN A) 10000 UNIT capsule Take 10,000 Units by mouth daily.      . Zinc 25 MG TABS Take 1 tablet by mouth daily.     . carvedilol (COREG) 3.125 MG tablet Take 1 tablet (3.125 mg total) by mouth 2 (two) times daily with a meal. 90 tablet 3   No facility-administered medications prior to visit.    ROS Review of Systems  Constitutional:  Negative.  Negative for fever, chills, diaphoresis, appetite change and fatigue.  HENT: Negative.   Eyes: Negative.   Respiratory: Negative.  Negative for cough, choking, chest tightness, shortness of breath and stridor.   Cardiovascular: Negative.  Negative for chest pain, palpitations and leg swelling.  Gastrointestinal: Negative.  Negative for nausea, vomiting, abdominal pain, diarrhea, constipation and blood in stool.  Endocrine: Negative.   Genitourinary: Negative.  Negative for difficulty urinating.  Musculoskeletal: Negative.  Negative for myalgias, back pain, arthralgias and neck pain.  Skin: Negative.  Negative for color change and rash.  Allergic/Immunologic: Negative.   Neurological: Negative.   Hematological: Negative.  Negative for adenopathy.  Psychiatric/Behavioral: Negative.     Objective:  BP 120/68 mmHg  Pulse 78  Temp(Src) 97.8 F (36.6 C) (Oral)  Resp 16  Ht 5' (1.524 m)  Wt 129 lb (58.514 kg)  BMI 25.19 kg/m2  SpO2 93%  BP Readings from Last 3 Encounters:  11/27/15 120/68  07/29/15 118/78  07/23/15 110/76    Wt Readings from Last 3 Encounters:  11/27/15 129 lb (58.514 kg)  07/29/15 130 lb (58.968 kg)  07/23/15 130 lb 4 oz (59.081 kg)    Physical Exam  Constitutional: She is oriented to person, place, and time. She appears well-developed and well-nourished.  HENT:  Head: Normocephalic and atraumatic.  Mouth/Throat: Oropharynx is clear and moist. No oropharyngeal exudate.  Eyes: Conjunctivae are normal. Right eye exhibits no discharge. Left eye exhibits no discharge. No scleral icterus.  Neck: Normal range of motion. Neck supple. No JVD present. No tracheal deviation present. No thyromegaly present.  Cardiovascular: Normal rate, regular rhythm, normal heart sounds and intact distal pulses.  Exam reveals no gallop and no friction rub.   No murmur heard. Pulmonary/Chest: Effort normal and breath sounds normal. No stridor. No respiratory distress. She  has no wheezes. She has no rales. She exhibits no tenderness.  Abdominal: Soft. Bowel sounds are normal. She exhibits no distension and no mass. There is no tenderness. There is no rebound and no guarding.  Genitourinary:  GU and breast exams deferred at her request She sees a GYN  Musculoskeletal: Normal range of motion. She exhibits no edema or tenderness.  Lymphadenopathy:    She has no cervical adenopathy.  Neurological: She is oriented to person, place, and time.  Skin: Skin is warm and dry. No rash noted. She is not diaphoretic. No erythema. No pallor.  Vitals reviewed.   Lab Results  Component Value Date   WBC 4.7 04/19/2015   HGB 13.5 04/19/2015   HGB 14.6 04/19/2015   HCT 38.8 04/19/2015   HCT 43.0 04/19/2015   PLT 204 04/19/2015   GLUCOSE 91 07/29/2015   CHOL 216* 11/27/2015   TRIG 53.0 11/27/2015   HDL 56.10 11/27/2015   LDLDIRECT 130.0 08/03/2011   LDLCALC 149* 11/27/2015   ALT 15 10/13/2014   AST 19 10/13/2014   NA 139 07/29/2015   K 4.4 07/29/2015   CL 104 07/29/2015   CREATININE 0.78 07/29/2015   BUN 12 07/29/2015   CO2 27 07/29/2015   TSH 1.77 11/27/2015   HGBA1C 5.3 07/29/2015    No results found.  Assessment & Plan:   [name] was seen today for annual exam, hypothyroidism and hyperlipidemia.  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 -     TSH; Future -     Lipid panel; Future  Hyperglycemia- improvement noted.  Hyperlipidemia with target LDL less than 130- her Framingham risk score is 10%, I have asked her to consider starting a statin. -     TSH; Future -     Lipid panel; Future  Tachycardia- her heart rate is normal and her palpitations have resolved with the addition of carvedilol. -     carvedilol (COREG) 3.125 MG tablet; Take 1 tablet (3.125 mg total) by mouth 2 (two) times daily with a meal.  Routine general  medical examination at a health care facility   I am having [name] maintain her Vitamin B-12 CR, Co Q 10, Fish Oil, vitamin A, Zinc, Vitamin D3, CALCIUM PO, pantoprazole, aspirin, Nutritional Supplements (DHEA PO), Biotin, MAGNESIUM OXIDE PO, thyroid, NONFORMULARY OR COMPOUNDED ITEM, ALPRAZolam, and carvedilol.  Meds ordered this encounter  Medications  . carvedilol (COREG) 3.125 MG tablet    Sig: Take 1 tablet (3.125 mg total) by mouth 2 (two) times daily with a meal.    Dispense:  180 tablet    Refill:  3   See AVS for instructions about healthy living and anticipatory guidance.  Follow-up: Return in about 6 months (around 05/26/2016).  Scarlette Calico, MD

## 2015-11-27 NOTE — Patient Instructions (Signed)
Preventive Care for Adults, Female A healthy lifestyle and preventive care can promote health and wellness. Preventive health guidelines for women include the following key practices.  A routine yearly physical is a good way to check with your health care provider about your health and preventive screening. It is a chance to share any concerns and updates on your health and to receive a thorough exam.  Visit your dentist for a routine exam and preventive care every 6 months. Brush your teeth twice a day and floss once a day. Good oral hygiene prevents tooth decay and gum disease.  The frequency of eye exams is based on your age, health, family medical history, use of contact lenses, and other factors. Follow your health care provider's recommendations for frequency of eye exams.  Eat a healthy diet. Foods like vegetables, fruits, whole grains, low-fat dairy products, and lean protein foods contain the nutrients you need without too many calories. Decrease your intake of foods high in solid fats, added sugars, and salt. Eat the right amount of calories for you.Get information about a proper diet from your health care provider, if necessary.  Regular physical exercise is one of the most important things you can do for your health. Most adults should get at least 150 minutes of moderate-intensity exercise (any activity that increases your heart rate and causes you to sweat) each week. In addition, most adults need muscle-strengthening exercises on 2 or more days a week.  Maintain a healthy weight. The body mass index (BMI) is a screening tool to identify possible weight problems. It provides an estimate of body fat based on height and weight. Your health care provider can find your BMI and can help you achieve or maintain a healthy weight.For adults 20 years and older:  A BMI below 18.5 is considered underweight.  A BMI of 18.5 to 24.9 is normal.  A BMI of 25 to 29.9 is considered overweight.  A  BMI of 30 and above is considered obese.  Maintain normal blood lipids and cholesterol levels by exercising and minimizing your intake of saturated fat. Eat a balanced diet with plenty of fruit and vegetables. Blood tests for lipids and cholesterol should begin at age 45 and be repeated every 5 years. If your lipid or cholesterol levels are high, you are over 50, or you are at high risk for heart disease, you may need your cholesterol levels checked more frequently.Ongoing high lipid and cholesterol levels should be treated with medicines if diet and exercise are not working.  If you smoke, find out from your health care provider how to quit. If you do not use tobacco, do not start.  Lung cancer screening is recommended for adults aged 45-80 years who are at high risk for developing lung cancer because of a history of smoking. A yearly low-dose CT scan of the lungs is recommended for people who have at least a 30-pack-year history of smoking and are a current smoker or have quit within the past 15 years. A pack year of smoking is smoking an average of 1 pack of cigarettes a day for 1 year (for example: 1 pack a day for 30 years or 2 packs a day for 15 years). Yearly screening should continue until the smoker has stopped smoking for at least 15 years. Yearly screening should be stopped for people who develop a health problem that would prevent them from having lung cancer treatment.  If you are pregnant, do not drink alcohol. If you are  breastfeeding, be very cautious about drinking alcohol. If you are not pregnant and choose to drink alcohol, do not have more than 1 drink per day. One drink is considered to be 12 ounces (355 mL) of beer, 5 ounces (148 mL) of wine, or 1.5 ounces (44 mL) of liquor.  Avoid use of street drugs. Do not share needles with anyone. Ask for help if you need support or instructions about stopping the use of drugs.  High blood pressure causes heart disease and increases the risk  of stroke. Your blood pressure should be checked at least every 1 to 2 years. Ongoing high blood pressure should be treated with medicines if weight loss and exercise do not work.  If you are 55-79 years old, ask your health care provider if you should take aspirin to prevent strokes.  Diabetes screening is done by taking a blood sample to check your blood glucose level after you have not eaten for a certain period of time (fasting). If you are not overweight and you do not have risk factors for diabetes, you should be screened once every 3 years starting at age 45. If you are overweight or obese and you are 40-70 years of age, you should be screened for diabetes every year as part of your cardiovascular risk assessment.  Breast cancer screening is essential preventive care for women. You should practice "breast self-awareness." This means understanding the normal appearance and feel of your breasts and may include breast self-examination. Any changes detected, no matter how small, should be reported to a health care provider. Women in their 20s and 30s should have a clinical breast exam (CBE) by a health care provider as part of a regular health exam every 1 to 3 years. After age 40, women should have a CBE every year. Starting at age 40, women should consider having a mammogram (breast X-ray test) every year. Women who have a family history of breast cancer should talk to their health care provider about genetic screening. Women at a high risk of breast cancer should talk to their health care providers about having an MRI and a mammogram every year.  Breast cancer gene (BRCA)-related cancer risk assessment is recommended for women who have family members with BRCA-related cancers. BRCA-related cancers include breast, ovarian, tubal, and peritoneal cancers. Having family members with these cancers may be associated with an increased risk for harmful changes (mutations) in the breast cancer genes BRCA1 and  BRCA2. Results of the assessment will determine the need for genetic counseling and BRCA1 and BRCA2 testing.  Your health care provider may recommend that you be screened regularly for cancer of the pelvic organs (ovaries, uterus, and vagina). This screening involves a pelvic examination, including checking for microscopic changes to the surface of your cervix (Pap test). You may be encouraged to have this screening done every 3 years, beginning at age 21.  For women ages 30-65, health care providers may recommend pelvic exams and Pap testing every 3 years, or they may recommend the Pap and pelvic exam, combined with testing for human papilloma virus (HPV), every 5 years. Some types of HPV increase your risk of cervical cancer. Testing for HPV may also be done on women of any age with unclear Pap test results.  Other health care providers may not recommend any screening for nonpregnant women who are considered low risk for pelvic cancer and who do not have symptoms. Ask your health care provider if a screening pelvic exam is right for   you.  If you have had past treatment for cervical cancer or a condition that could lead to cancer, you need Pap tests and screening for cancer for at least 20 years after your treatment. If Pap tests have been discontinued, your risk factors (such as having a new sexual partner) need to be reassessed to determine if screening should resume. Some women have medical problems that increase the chance of getting cervical cancer. In these cases, your health care provider may recommend more frequent screening and Pap tests.  Colorectal cancer can be detected and often prevented. Most routine colorectal cancer screening begins at the age of 50 years and continues through age 75 years. However, your health care provider may recommend screening at an earlier age if you have risk factors for colon cancer. On a yearly basis, your health care provider may provide home test kits to check  for hidden blood in the stool. Use of a small camera at the end of a tube, to directly examine the colon (sigmoidoscopy or colonoscopy), can detect the earliest forms of colorectal cancer. Talk to your health care provider about this at age 50, when routine screening begins. Direct exam of the colon should be repeated every 5-10 years through age 75 years, unless early forms of precancerous polyps or small growths are found.  People who are at an increased risk for hepatitis B should be screened for this virus. You are considered at high risk for hepatitis B if:  You were born in a country where hepatitis B occurs often. Talk with your health care provider about which countries are considered high risk.  Your parents were born in a high-risk country and you have not received a shot to protect against hepatitis B (hepatitis B vaccine).  You have HIV or AIDS.  You use needles to inject street drugs.  You live with, or have sex with, someone who has hepatitis B.  You get hemodialysis treatment.  You take certain medicines for conditions like cancer, organ transplantation, and autoimmune conditions.  Hepatitis C blood testing is recommended for all people born from 1945 through 1965 and any individual with known risks for hepatitis C.  Practice safe sex. Use condoms and avoid high-risk sexual practices to reduce the spread of sexually transmitted infections (STIs). STIs include gonorrhea, chlamydia, syphilis, trichomonas, herpes, HPV, and human immunodeficiency virus (HIV). Herpes, HIV, and HPV are viral illnesses that have no cure. They can result in disability, cancer, and death.  You should be screened for sexually transmitted illnesses (STIs) including gonorrhea and chlamydia if:  You are sexually active and are younger than 24 years.  You are older than 24 years and your health care provider tells you that you are at risk for this type of infection.  Your sexual activity has changed  since you were last screened and you are at an increased risk for chlamydia or gonorrhea. Ask your health care provider if you are at risk.  If you are at risk of being infected with HIV, it is recommended that you take a prescription medicine daily to prevent HIV infection. This is called preexposure prophylaxis (PrEP). You are considered at risk if:  You are sexually active and do not regularly use condoms or know the HIV status of your partner(s).  You take drugs by injection.  You are sexually active with a partner who has HIV.  Talk with your health care provider about whether you are at high risk of being infected with HIV. If   you choose to begin PrEP, you should first be tested for HIV. You should then be tested every 3 months for as long as you are taking PrEP.  Osteoporosis is a disease in which the bones lose minerals and strength with aging. This can result in serious bone fractures or breaks. The risk of osteoporosis can be identified using a bone density scan. Women ages 67 years and over and women at risk for fractures or osteoporosis should discuss screening with their health care providers. Ask your health care provider whether you should take a calcium supplement or vitamin D to reduce the rate of osteoporosis.  Menopause can be associated with physical symptoms and risks. Hormone replacement therapy is available to decrease symptoms and risks. You should talk to your health care provider about whether hormone replacement therapy is right for you.  Use sunscreen. Apply sunscreen liberally and repeatedly throughout the day. You should seek shade when your shadow is shorter than you. Protect yourself by wearing long sleeves, pants, a wide-brimmed hat, and sunglasses year round, whenever you are outdoors.  Once a month, do a whole body skin exam, using a mirror to look at the skin on your back. Tell your health care provider of new moles, moles that have irregular borders, moles that  are larger than a pencil eraser, or moles that have changed in shape or color.  Stay current with required vaccines (immunizations).  Influenza vaccine. All adults should be immunized every year.  Tetanus, diphtheria, and acellular pertussis (Td, Tdap) vaccine. Pregnant women should receive 1 dose of Tdap vaccine during each pregnancy. The dose should be obtained regardless of the length of time since the last dose. Immunization is preferred during the 27th-36th week of gestation. An adult who has not previously received Tdap or who does not know her vaccine status should receive 1 dose of Tdap. This initial dose should be followed by tetanus and diphtheria toxoids (Td) booster doses every 10 years. Adults with an unknown or incomplete history of completing a 3-dose immunization series with Td-containing vaccines should begin or complete a primary immunization series including a Tdap dose. Adults should receive a Td booster every 10 years.  Varicella vaccine. An adult without evidence of immunity to varicella should receive 2 doses or a second dose if she has previously received 1 dose. Pregnant females who do not have evidence of immunity should receive the first dose after pregnancy. This first dose should be obtained before leaving the health care facility. The second dose should be obtained 4-8 weeks after the first dose.  Human papillomavirus (HPV) vaccine. Females aged 13-26 years who have not received the vaccine previously should obtain the 3-dose series. The vaccine is not recommended for use in pregnant females. However, pregnancy testing is not needed before receiving a dose. If a female is found to be pregnant after receiving a dose, no treatment is needed. In that case, the remaining doses should be delayed until after the pregnancy. Immunization is recommended for any person with an immunocompromised condition through the age of 61 years if she did not get any or all doses earlier. During the  3-dose series, the second dose should be obtained 4-8 weeks after the first dose. The third dose should be obtained 24 weeks after the first dose and 16 weeks after the second dose.  Zoster vaccine. One dose is recommended for adults aged 30 years or older unless certain conditions are present.  Measles, mumps, and rubella (MMR) vaccine. Adults born  before 1957 generally are considered immune to measles and mumps. Adults born in 1957 or later should have 1 or more doses of MMR vaccine unless there is a contraindication to the vaccine or there is laboratory evidence of immunity to each of the three diseases. A routine second dose of MMR vaccine should be obtained at least 28 days after the first dose for students attending postsecondary schools, health care workers, or international travelers. People who received inactivated measles vaccine or an unknown type of measles vaccine during 1963-1967 should receive 2 doses of MMR vaccine. People who received inactivated mumps vaccine or an unknown type of mumps vaccine before 1979 and are at high risk for mumps infection should consider immunization with 2 doses of MMR vaccine. For females of childbearing age, rubella immunity should be determined. If there is no evidence of immunity, females who are not pregnant should be vaccinated. If there is no evidence of immunity, females who are pregnant should delay immunization until after pregnancy. Unvaccinated health care workers born before 1957 who lack laboratory evidence of measles, mumps, or rubella immunity or laboratory confirmation of disease should consider measles and mumps immunization with 2 doses of MMR vaccine or rubella immunization with 1 dose of MMR vaccine.  Pneumococcal 13-valent conjugate (PCV13) vaccine. When indicated, a person who is uncertain of his immunization history and has no record of immunization should receive the PCV13 vaccine. All adults 65 years of age and older should receive this  vaccine. An adult aged 19 years or older who has certain medical conditions and has not been previously immunized should receive 1 dose of PCV13 vaccine. This PCV13 should be followed with a dose of pneumococcal polysaccharide (PPSV23) vaccine. Adults who are at high risk for pneumococcal disease should obtain the PPSV23 vaccine at least 8 weeks after the dose of PCV13 vaccine. Adults older than 74 years of age who have normal immune system function should obtain the PPSV23 vaccine dose at least 1 year after the dose of PCV13 vaccine.  Pneumococcal polysaccharide (PPSV23) vaccine. When PCV13 is also indicated, PCV13 should be obtained first. All adults aged 65 years and older should be immunized. An adult younger than age 65 years who has certain medical conditions should be immunized. Any person who resides in a nursing home or long-term care facility should be immunized. An adult smoker should be immunized. People with an immunocompromised condition and certain other conditions should receive both PCV13 and PPSV23 vaccines. People with human immunodeficiency virus (HIV) infection should be immunized as soon as possible after diagnosis. Immunization during chemotherapy or radiation therapy should be avoided. Routine use of PPSV23 vaccine is not recommended for American Indians, [place] Natives, or people younger than 65 years unless there are medical conditions that require PPSV23 vaccine. When indicated, people who have unknown immunization and have no record of immunization should receive PPSV23 vaccine. One-time revaccination 5 years after the first dose of PPSV23 is recommended for people aged 19-64 years who have chronic kidney failure, nephrotic syndrome, asplenia, or immunocompromised conditions. People who received 1-2 doses of PPSV23 before age 65 years should receive another dose of PPSV23 vaccine at age 65 years or later if at least 5 years have passed since the previous dose. Doses of PPSV23 are not  needed for people immunized with PPSV23 at or after age 65 years.  Meningococcal vaccine. Adults with asplenia or persistent complement component deficiencies should receive 2 doses of quadrivalent meningococcal conjugate (MenACWY-D) vaccine. The doses should be obtained   at least 2 months apart. Microbiologists working with certain meningococcal bacteria, [place] recruits, people at risk during an outbreak, and people who travel to or live in countries with a high rate of meningitis should be immunized. A first-year college student up through age 34 years who is living in a residence hall should receive a dose if she did not receive a dose on or after her 16th birthday. Adults who have certain high-risk conditions should receive one or more doses of vaccine.  Hepatitis A vaccine. Adults who wish to be protected from this disease, have certain high-risk conditions, work with hepatitis A-infected animals, work in hepatitis A research labs, or travel to or work in countries with a high rate of hepatitis A should be immunized. Adults who were previously unvaccinated and who anticipate close contact with an international adoptee during the first 60 days after arrival in the [place] from a country with a high rate of hepatitis A should be immunized.  Hepatitis B vaccine. Adults who wish to be protected from this disease, have certain high-risk conditions, may be exposed to blood or other infectious body fluids, are household contacts or sex partners of hepatitis B positive people, are clients or workers in certain care facilities, or travel to or work in countries with a high rate of hepatitis B should be immunized.  Haemophilus influenzae type b (Hib) vaccine. A previously unvaccinated person with asplenia or sickle cell disease or having a scheduled splenectomy should receive 1 dose of Hib vaccine. Regardless of previous immunization, a recipient of a hematopoietic stem cell transplant should receive a  3-dose series 6-12 months after her successful transplant. Hib vaccine is not recommended for adults with HIV infection. Preventive Services / Frequency Ages 35 to 4 years  Blood pressure check.** / Every 3-5 years.  Lipid and cholesterol check.** / Every 5 years beginning at age 60.  Clinical breast exam.** / Every 3 years for women in their 71s and 10s.  BRCA-related cancer risk assessment.** / For women who have family members with a BRCA-related cancer (breast, ovarian, tubal, or peritoneal cancers).  Pap test.** / Every 2 years from ages 76 through 26. Every 3 years starting at age 61 through age 76 or 93 with a history of 3 consecutive normal Pap tests.  HPV screening.** / Every 3 years from ages 37 through ages 60 to 51 with a history of 3 consecutive normal Pap tests.  Hepatitis C blood test.** / For any individual with known risks for hepatitis C.  Skin self-exam. / Monthly.  Influenza vaccine. / Every year.  Tetanus, diphtheria, and acellular pertussis (Tdap, Td) vaccine.** / Consult your health care provider. Pregnant women should receive 1 dose of Tdap vaccine during each pregnancy. 1 dose of Td every 10 years.  Varicella vaccine.** / Consult your health care provider. Pregnant females who do not have evidence of immunity should receive the first dose after pregnancy.  HPV vaccine. / 3 doses over 6 months, if 93 and younger. The vaccine is not recommended for use in pregnant females. However, pregnancy testing is not needed before receiving a dose.  Measles, mumps, rubella (MMR) vaccine.** / You need at least 1 dose of MMR if you were born in 1957 or later. You may also need a 2nd dose. For females of childbearing age, rubella immunity should be determined. If there is no evidence of immunity, females who are not pregnant should be vaccinated. If there is no evidence of immunity, females who are  pregnant should delay immunization until after pregnancy.  Pneumococcal  13-valent conjugate (PCV13) vaccine.** / Consult your health care provider.  Pneumococcal polysaccharide (PPSV23) vaccine.** / 1 to 2 doses if you smoke cigarettes or if you have certain conditions.  Meningococcal vaccine.** / 1 dose if you are age 68 to 8 years and a Market researcher living in a residence hall, or have one of several medical conditions, you need to get vaccinated against meningococcal disease. You may also need additional booster doses.  Hepatitis A vaccine.** / Consult your health care provider.  Hepatitis B vaccine.** / Consult your health care provider.  Haemophilus influenzae type b (Hib) vaccine.** / Consult your health care provider. Ages 7 to 53 years  Blood pressure check.** / Every year.  Lipid and cholesterol check.** / Every 5 years beginning at age 25 years.  Lung cancer screening. / Every year if you are aged 11-80 years and have a 30-pack-year history of smoking and currently smoke or have quit within the past 15 years. Yearly screening is stopped once you have quit smoking for at least 15 years or develop a health problem that would prevent you from having lung cancer treatment.  Clinical breast exam.** / Every year after age 48 years.  BRCA-related cancer risk assessment.** / For women who have family members with a BRCA-related cancer (breast, ovarian, tubal, or peritoneal cancers).  Mammogram.** / Every year beginning at age 41 years and continuing for as long as you are in good health. Consult with your health care provider.  Pap test.** / Every 3 years starting at age 65 years through age 37 or 70 years with a history of 3 consecutive normal Pap tests.  HPV screening.** / Every 3 years from ages 72 years through ages 60 to 40 years with a history of 3 consecutive normal Pap tests.  Fecal occult blood test (FOBT) of stool. / Every year beginning at age 21 years and continuing until age 5 years. You may not need to do this test if you get  a colonoscopy every 10 years.  Flexible sigmoidoscopy or colonoscopy.** / Every 5 years for a flexible sigmoidoscopy or every 10 years for a colonoscopy beginning at age 35 years and continuing until age 48 years.  Hepatitis C blood test.** / For all people born from [birth year] through 1965 and any individual with known risks for hepatitis C.  Skin self-exam. / Monthly.  Influenza vaccine. / Every year.  Tetanus, diphtheria, and acellular pertussis (Tdap/Td) vaccine.** / Consult your health care provider. Pregnant women should receive 1 dose of Tdap vaccine during each pregnancy. 1 dose of Td every 10 years.  Varicella vaccine.** / Consult your health care provider. Pregnant females who do not have evidence of immunity should receive the first dose after pregnancy.  Zoster vaccine.** / 1 dose for adults aged 30 years or older.  Measles, mumps, rubella (MMR) vaccine.** / You need at least 1 dose of MMR if you were born in 1957 or later. You may also need a second dose. For females of childbearing age, rubella immunity should be determined. If there is no evidence of immunity, females who are not pregnant should be vaccinated. If there is no evidence of immunity, females who are pregnant should delay immunization until after pregnancy.  Pneumococcal 13-valent conjugate (PCV13) vaccine.** / Consult your health care provider.  Pneumococcal polysaccharide (PPSV23) vaccine.** / 1 to 2 doses if you smoke cigarettes or if you have certain conditions.  Meningococcal vaccine.** /  Consult your health care provider.  Hepatitis A vaccine.** / Consult your health care provider.  Hepatitis B vaccine.** / Consult your health care provider.  Haemophilus influenzae type b (Hib) vaccine.** / Consult your health care provider. Ages 64 years and over  Blood pressure check.** / Every year.  Lipid and cholesterol check.** / Every 5 years beginning at age 23 years.  Lung cancer screening. / Every year if you  are aged 16-80 years and have a 30-pack-year history of smoking and currently smoke or have quit within the past 15 years. Yearly screening is stopped once you have quit smoking for at least 15 years or develop a health problem that would prevent you from having lung cancer treatment.  Clinical breast exam.** / Every year after age 74 years.  BRCA-related cancer risk assessment.** / For women who have family members with a BRCA-related cancer (breast, ovarian, tubal, or peritoneal cancers).  Mammogram.** / Every year beginning at age 44 years and continuing for as long as you are in good health. Consult with your health care provider.  Pap test.** / Every 3 years starting at age 58 years through age 22 or 39 years with 3 consecutive normal Pap tests. Testing can be stopped between 65 and 70 years with 3 consecutive normal Pap tests and no abnormal Pap or HPV tests in the past 10 years.  HPV screening.** / Every 3 years from ages 64 years through ages 70 or 61 years with a history of 3 consecutive normal Pap tests. Testing can be stopped between 65 and 70 years with 3 consecutive normal Pap tests and no abnormal Pap or HPV tests in the past 10 years.  Fecal occult blood test (FOBT) of stool. / Every year beginning at age 40 years and continuing until age 27 years. You may not need to do this test if you get a colonoscopy every 10 years.  Flexible sigmoidoscopy or colonoscopy.** / Every 5 years for a flexible sigmoidoscopy or every 10 years for a colonoscopy beginning at age 7 years and continuing until age 32 years.  Hepatitis C blood test.** / For all people born from [birth year] through 1964 and any individual with known risks for hepatitis C.  Osteoporosis screening.** / A one-time screening for women ages 30 years and over and women at risk for fractures or osteoporosis.  Skin self-exam. / Monthly.  Influenza vaccine. / Every year.  Tetanus, diphtheria, and acellular pertussis (Tdap/Td)  vaccine.** / 1 dose of Td every 10 years.  Varicella vaccine.** / Consult your health care provider.  Zoster vaccine.** / 1 dose for adults aged 35 years or older.  Pneumococcal 13-valent conjugate (PCV13) vaccine.** / Consult your health care provider.  Pneumococcal polysaccharide (PPSV23) vaccine.** / 1 dose for all adults aged 46 years and older.  Meningococcal vaccine.** / Consult your health care provider.  Hepatitis A vaccine.** / Consult your health care provider.  Hepatitis B vaccine.** / Consult your health care provider.  Haemophilus influenzae type b (Hib) vaccine.** / Consult your health care provider. ** Family history and personal history of risk and conditions may change your health care provider's recommendations.   This information is not intended to replace advice given to you by your health care provider. Make sure you discuss any questions you have with your health care provider.   Document Released: 01/04/2002 Document Revised: 11/29/2014 Document Reviewed: 04/05/2011 Elsevier Interactive Patient Education [employer].

## 2015-12-01 NOTE — Assessment & Plan Note (Signed)

## 2015-12-22 ENCOUNTER — Ambulatory Visit
Admission: RE | Admit: 2015-12-22 | Discharge: 2015-12-22 | Disposition: A | Discharge status: Home / Self Care | Payer: Medicare Other | Source: Ambulatory Visit

## 2015-12-22 DIAGNOSIS — Z1231 Encounter for screening mammogram for malignant neoplasm of breast: Secondary | ICD-10-CM | POA: Diagnosis not present

## 2015-12-25 ENCOUNTER — Other Ambulatory Visit: Payer: Self-pay | Admitting: Internal Medicine

## 2015-12-25 DIAGNOSIS — R928 Other abnormal and inconclusive findings on diagnostic imaging of breast: Secondary | ICD-10-CM

## 2016-01-07 ENCOUNTER — Ambulatory Visit
Admission: RE | Admit: 2016-01-07 | Discharge: 2016-01-07 | Disposition: A | Discharge status: Home / Self Care | Payer: Medicare Other | Source: Ambulatory Visit | Attending: Internal Medicine | Admitting: Internal Medicine

## 2016-01-07 DIAGNOSIS — R928 Other abnormal and inconclusive findings on diagnostic imaging of breast: Secondary | ICD-10-CM

## 2016-01-07 DIAGNOSIS — N63 Unspecified lump in breast: Secondary | ICD-10-CM | POA: Diagnosis not present

## 2016-01-07 LAB — HM MAMMOGRAPHY

## 2016-02-27 ENCOUNTER — Encounter: Payer: Self-pay | Admitting: Neurology

## 2016-02-27 ENCOUNTER — Ambulatory Visit (INDEPENDENT_AMBULATORY_CARE_PROVIDER_SITE_OTHER): Payer: Medicare Other | Admitting: Neurology

## 2016-02-27 VITALS — BP 118/70 | HR 68 | Ht 60.0 in | Wt 131.4 lb

## 2016-02-27 DIAGNOSIS — G43109 Migraine with aura, not intractable, without status migrainosus: Secondary | ICD-10-CM

## 2016-02-27 MED ORDER — MECLIZINE HCL 12.5 MG PO TABS: 12.5000 mg | ORAL_TABLET | Freq: Two times a day (BID) | ORAL | Status: DC | PRN

## 2016-02-27 MED ORDER — NAPROXEN 500 MG PO TABS: 500.0000 mg | ORAL_TABLET | Freq: Two times a day (BID) | ORAL | Status: DC | PRN

## 2016-02-27 NOTE — Patient Instructions (Addendum)
1.  Take magnesium 400-600 mg daily until headaches are improved 2.  You can take naproxen 500mg  twice daily as needed for severe headaches.   3.  Limit all rescue medications (tylenol, excedrin, Aleve, naproxen) to twice per week  Return to clinic as needed

## 2016-02-27 NOTE — Progress Notes (Signed)
Follow-up Visit   Date: 02/27/2016    BIBIANNA Little MRN: [id number] DOB: [birth year]   Interim History: [name] is a 75 y.o. right-handed Caucasian female with GERD, hypothyroidism, hyperlipidemia, esophageal structure/Schatzi rings s/p dilatation, and migraines returning to the clinic for follow-up of migraine equivalent syndrome.  The patient was accompanied to the clinic by self.  History of present illness: She reports having along history of headaches since the in 1970s. She was taking aspirin with codeine which seemed to help. In 2002, she presented to the emergency department with left sided lack of sensation (face, arm, leg) with negative work-up. She was told she had a TIA and was started on aspirin.  Starting around March 2016, she developed new daily right parietal headaches and sought medical care. She reports xanax and tylenol seems to help. Nothing exacerbates her pain. Denies any photophobia, phonophobia, nausea/vomiting. She had spells of lack of sensation over left side of her cheek which usually occurs in the morning and can occur with or without headaches. The numbness usually lasts about 3-4 hours and is improved after she is up and moving around. She takes tylenol twice per week.  She has no been on any daily preventative headache therapy. She was given nortriptyline 10mg  at bedtime for headaches, but she was very concerned about the side effect profile, so did not start this.  She is seeing Dr. 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 Additionally, she was started on magnesium (6) tablets of 250mg  daily in addition to magnesium infusion every two weeks for the past two weeks.   UPDATE 07/23/2015:  She was started on nortriptyline 10mg  and reports having significant improvement in her headaches, because she has only had two headaches since her last visit.   She ran out of the medication last week and does not wish to restart it because of problems with weight gain.  For her episodic headaches, tylenol seems to help.  No new complaints.  Facial paresthesias has also completely resolved.  UPDATE 02/27/2016:  [name] has been doing well since her last visit and headaches had completed resolved until this month.  She woke up earlier this week with right sided dull pressure over the parietal region and a spell of dizziness lasting 10-minutes.  Headache has been constant since this time, but she has not had any more spells of spinning-sensation.  She tried Aleve which provides mild relief.  This headache is very similar to what she has experienced before, including transient spell of left facial discomfort and lack of sensation, without numbness.  This resolved with magnesium.   No new vision changes, weakness, or falls.    Medications:  Current Outpatient Prescriptions on File Prior to Visit  Medication Sig Dispense Refill  . ALPRAZolam (XANAX) 0.25 MG tablet TAKE ONE TABLET BY MOUTH THREE TIMES DAILY AS NEEDED FOR SLEEP OR ANXIETY 65 tablet 2  . aspirin 81 MG tablet Take 162 mg by mouth daily.    . Biotin 5000 MCG CAPS Take 5,000 capsules by mouth daily.     Marland Kitchen CALCIUM PO Take 1-2 capsules by mouth at bedtime.     . carvedilol (COREG) 3.125 MG tablet Take 1 tablet (3.125 mg total) by mouth 2 (two) times daily with a meal. 180 tablet 3  . Cholecalciferol (VITAMIN D3) 5000 UNITS TABS Take 1 tablet by mouth daily.     . Coenzyme Q10 (  CO Q 10) 10 MG CAPS Take 1 tablet by mouth daily.     . Cyanocobalamin (VITAMIN B-12 CR) 1500 MCG TBCR Take 1 tablet by mouth 3 (three) times a week.     Marland Kitchen MAGNESIUM OXIDE PO Take 4 tablets by mouth 2 (two) times daily.     . NONFORMULARY OR COMPOUNDED ITEM Place 0.5 Troches under the tongue daily. TRIEST (8-1-1)/PROG/TEST (POLY) (CINNAMON)    . Nutritional Supplements (DHEA PO) Take 5 mg by mouth every morning.     . Omega-3  Fatty Acids (FISH OIL) 1000 MG CAPS Take 2 capsules by mouth daily.     . pantoprazole (PROTONIX) 40 MG tablet Take 1 tablet (40 mg total) by mouth 2 (two) times daily. (Patient taking differently: Take 40 mg by mouth daily as needed (acid reflux). ) 60 tablet 11  . thyroid (ARMOUR) 65 MG tablet Take 65 mg by mouth daily. Nature brand    . vitamin A (RA VITAMIN A) 10000 UNIT capsule Take 10,000 Units by mouth daily.      . Zinc 25 MG TABS Take 1 tablet by mouth daily.      No current facility-administered medications on file prior to visit.    Allergies: No Known Allergies  Review of Systems:  CONSTITUTIONAL: No fevers, chills, night sweats, or weight loss.  EYES: No visual changes or eye pain ENT: No hearing changes.  No history of nose bleeds.   RESPIRATORY: No cough, wheezing and shortness of breath.   CARDIOVASCULAR: Negative for chest pain, and palpitations.   GI: Negative for abdominal discomfort, blood in stools or black stools.  No recent change in bowel habits.   GU:  No history of incontinence.   MUSCLOSKELETAL: No history of joint pain or swelling.  No myalgias.   SKIN: Negative for lesions, rash, and itching.   ENDOCRINE: Negative for cold or heat intolerance, polydipsia or goiter.   PSYCH:  No depression or anxiety symptoms.   NEURO: As Above.   Vital Signs:  BP 118/70 mmHg  Pulse 68  Ht 5' (1.524 m)  Wt 131 lb 6 oz (59.591 kg)  BMI 25.66 kg/m2  General:  Well-appearing CV:  RRR Ext:  No edema  Neurological Exam: MENTAL STATUS including orientation to time, place, person, recent and remote memory, attention span and concentration, language, and fund of knowledge is normal.  Speech is not dysarthric.  CRANIAL NERVES:  Normal fundoscopic exam. Pupils equal round and reactive to light.  Normal conjugate, extra-ocular eye movements in all directions of gaze.  No ptosis. Normal facial sensation.  Face is symmetric. Palate elevates symmetrically.  Tongue is  midline.  MOTOR:  Motor strength is 5/5 in all extremities.    MSRs:  Reflexes are 2+/4 throught.  SENSORY:  Vibration intact throughout  COORDINATION/GAIT:  Gait narrow based and stable.   Data:  MRI brain 04/01/2015:  1. No acute intracranial abnormality or significant interval change.  2. Stable white matter disease. This is likely within normal limits for age. Similar findings can be seen with chronic migraine type headaches or vasculitis. There is no evidence for progression.  MRi brain 07/12/2014: No acute or significant finding. No cause of headache is identified. Mild chronic small-vessel change of the cerebral hemispheric white matter, less than often seen in asymptomatic persons of this age.  US carotids 09/06/2013: Normal carotids  EMG performed at [facility] on 08/17/2011: Normal NCS of the left lower extremity.   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 NO ACUTE INTRACRANIAL ABNORMALITY.  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 MRA IS OTHERWISE UNREMARKABLE.   IMPRESSION/PLAN: Migraine equivalent syndrome manifesting with right parietal headache and transient vertigo and left facial dysesthesias, similar as to what she experienced in 2016.  MRI brain from 04/01/2015 is unremarkable.  Previously headaches improved with nortriptyline 10mg  qhs, but she does not wish to be on a daily medication at this time.  She seems to gain the most benefit with Aleve, so I will give her prescription for naproxen 500mg  BID prn severe headaches.  1.  Take magnesium 400-600 mg daily until headaches are improved 2.  Take naproxen 500mg  twice daily as needed for severe headaches.   3.  Limit all rescue medications (tylenol, excedrin, Aleve, naproxen) to twice per week  Return to clinic as needed    The duration of this appointment visit was 25 minutes of face-to-face time  with the patient.  Greater than 50% of this time was spent in counseling, explanation of diagnosis, planning of further management, and coordination of care.   Thank you for allowing me to participate in patient's care.  If I can answer any additional questions, I would be pleased to do so.    Sincerely,    Donika K. Posey Pronto, DO

## 2016-03-16 DIAGNOSIS — H2513 Age-related nuclear cataract, bilateral: Secondary | ICD-10-CM | POA: Diagnosis not present

## 2016-03-16 DIAGNOSIS — H43813 Vitreous degeneration, bilateral: Secondary | ICD-10-CM | POA: Diagnosis not present

## 2016-03-24 DIAGNOSIS — Z0001 Encounter for general adult medical examination with abnormal findings: Secondary | ICD-10-CM | POA: Diagnosis not present

## 2016-03-24 DIAGNOSIS — R739 Hyperglycemia, unspecified: Secondary | ICD-10-CM | POA: Diagnosis not present

## 2016-03-24 DIAGNOSIS — I10 Essential (primary) hypertension: Secondary | ICD-10-CM | POA: Diagnosis not present

## 2016-03-24 DIAGNOSIS — E279 Disorder of adrenal gland, unspecified: Secondary | ICD-10-CM | POA: Diagnosis not present

## 2016-03-24 DIAGNOSIS — E7210 Disorders of sulfur-bearing amino-acid metabolism, unspecified: Secondary | ICD-10-CM | POA: Diagnosis not present

## 2016-03-24 DIAGNOSIS — N951 Menopausal and female climacteric states: Secondary | ICD-10-CM | POA: Diagnosis not present

## 2016-03-24 DIAGNOSIS — E611 Iron deficiency: Secondary | ICD-10-CM | POA: Diagnosis not present

## 2016-03-24 DIAGNOSIS — R6889 Other general symptoms and signs: Secondary | ICD-10-CM | POA: Diagnosis not present

## 2016-03-24 DIAGNOSIS — E559 Vitamin D deficiency, unspecified: Secondary | ICD-10-CM | POA: Diagnosis not present

## 2016-03-24 DIAGNOSIS — E509 Vitamin A deficiency, unspecified: Secondary | ICD-10-CM | POA: Diagnosis not present

## 2016-03-24 DIAGNOSIS — E039 Hypothyroidism, unspecified: Secondary | ICD-10-CM | POA: Diagnosis not present

## 2016-03-24 DIAGNOSIS — R5383 Other fatigue: Secondary | ICD-10-CM | POA: Diagnosis not present

## 2016-03-24 DIAGNOSIS — E612 Magnesium deficiency: Secondary | ICD-10-CM | POA: Diagnosis not present

## 2016-03-24 DIAGNOSIS — J029 Acute pharyngitis, unspecified: Secondary | ICD-10-CM | POA: Diagnosis not present

## 2016-03-24 DIAGNOSIS — D509 Iron deficiency anemia, unspecified: Secondary | ICD-10-CM | POA: Diagnosis not present

## 2016-04-14 DIAGNOSIS — Z0001 Encounter for general adult medical examination with abnormal findings: Secondary | ICD-10-CM | POA: Diagnosis not present

## 2016-04-14 DIAGNOSIS — N951 Menopausal and female climacteric states: Secondary | ICD-10-CM | POA: Diagnosis not present

## 2016-04-14 DIAGNOSIS — R6889 Other general symptoms and signs: Secondary | ICD-10-CM | POA: Diagnosis not present

## 2016-04-21 DIAGNOSIS — Z0001 Encounter for general adult medical examination with abnormal findings: Secondary | ICD-10-CM | POA: Diagnosis not present

## 2016-04-21 DIAGNOSIS — E611 Iron deficiency: Secondary | ICD-10-CM | POA: Diagnosis not present

## 2016-04-21 DIAGNOSIS — N951 Menopausal and female climacteric states: Secondary | ICD-10-CM | POA: Diagnosis not present

## 2016-05-07 DIAGNOSIS — N951 Menopausal and female climacteric states: Secondary | ICD-10-CM | POA: Diagnosis not present

## 2016-05-07 DIAGNOSIS — J029 Acute pharyngitis, unspecified: Secondary | ICD-10-CM | POA: Diagnosis not present

## 2016-05-07 DIAGNOSIS — Z0001 Encounter for general adult medical examination with abnormal findings: Secondary | ICD-10-CM | POA: Diagnosis not present

## 2016-05-07 DIAGNOSIS — E611 Iron deficiency: Secondary | ICD-10-CM | POA: Diagnosis not present

## 2016-05-07 DIAGNOSIS — R5383 Other fatigue: Secondary | ICD-10-CM | POA: Diagnosis not present

## 2016-05-07 DIAGNOSIS — E039 Hypothyroidism, unspecified: Secondary | ICD-10-CM | POA: Diagnosis not present

## 2016-08-06 DIAGNOSIS — E611 Iron deficiency: Secondary | ICD-10-CM | POA: Diagnosis not present

## 2016-08-06 DIAGNOSIS — I10 Essential (primary) hypertension: Secondary | ICD-10-CM | POA: Diagnosis not present

## 2016-08-06 DIAGNOSIS — E279 Disorder of adrenal gland, unspecified: Secondary | ICD-10-CM | POA: Diagnosis not present

## 2016-08-06 DIAGNOSIS — E8840 Mitochondrial metabolism disorder, unspecified: Secondary | ICD-10-CM | POA: Diagnosis not present

## 2016-08-06 DIAGNOSIS — E039 Hypothyroidism, unspecified: Secondary | ICD-10-CM | POA: Diagnosis not present

## 2016-08-06 DIAGNOSIS — E7210 Disorders of sulfur-bearing amino-acid metabolism, unspecified: Secondary | ICD-10-CM | POA: Diagnosis not present

## 2016-08-06 DIAGNOSIS — E7800 Pure hypercholesterolemia, unspecified: Secondary | ICD-10-CM | POA: Diagnosis not present

## 2016-08-06 DIAGNOSIS — E559 Vitamin D deficiency, unspecified: Secondary | ICD-10-CM | POA: Diagnosis not present

## 2016-08-30 DIAGNOSIS — D239 Other benign neoplasm of skin, unspecified: Secondary | ICD-10-CM | POA: Diagnosis not present

## 2016-08-30 DIAGNOSIS — L821 Other seborrheic keratosis: Secondary | ICD-10-CM | POA: Diagnosis not present

## 2016-09-20 ENCOUNTER — Ambulatory Visit (INDEPENDENT_AMBULATORY_CARE_PROVIDER_SITE_OTHER): Payer: Medicare Other | Admitting: Internal Medicine

## 2016-09-20 ENCOUNTER — Encounter: Payer: Self-pay | Admitting: Internal Medicine

## 2016-09-20 ENCOUNTER — Other Ambulatory Visit: Payer: Medicare Other

## 2016-09-20 VITALS — BP 120/68 | HR 70 | Temp 98.7°F | Resp 16 | Ht 60.0 in | Wt 127.0 lb

## 2016-09-20 DIAGNOSIS — R000 Tachycardia, unspecified: Secondary | ICD-10-CM

## 2016-09-20 DIAGNOSIS — R209 Unspecified disturbances of skin sensation: Principal | ICD-10-CM

## 2016-09-20 DIAGNOSIS — IMO0001 Reserved for inherently not codable concepts without codable children: Secondary | ICD-10-CM

## 2016-09-20 MED ORDER — CARVEDILOL 3.125 MG PO TABS: 3.1250 mg | ORAL_TABLET | Freq: Two times a day (BID) | ORAL | 1 refills | Status: DC

## 2016-09-20 NOTE — Progress Notes (Signed)
Subjective:  Patient ID: [name], female    DOB: 10-29-1941  Age: 75 y.o. MRN: [id number]  CC: Palpitations   HPI [name] presents for hx of palpitations and tachycardia, she has been started on carvedilol and she tells me that the palpitations have resolved. She feels well today and offers no complaints.  She is moving to [facility] and needs to have an RPR dome for immigration.  Outpatient Medications Prior to Visit  Medication Sig Dispense Refill  . ALPRAZolam (XANAX) 0.25 MG tablet TAKE ONE TABLET BY MOUTH THREE TIMES DAILY AS NEEDED FOR SLEEP OR ANXIETY 65 tablet 2  . aspirin 81 MG tablet Take 162 mg by mouth daily.    Marland Kitchen CALCIUM PO Take 1-2 capsules by mouth at bedtime.     . Cholecalciferol (VITAMIN D3) 5000 UNITS TABS Take 1 tablet by mouth daily.     . Cyanocobalamin (VITAMIN B-12 CR) 1500 MCG TBCR Take 1 tablet by mouth 3 (three) times a week.     . Omega-3 Fatty Acids (FISH OIL) 1000 MG CAPS Take 2 capsules by mouth daily.     . pantoprazole (PROTONIX) 40 MG tablet Take 1 tablet (40 mg total) by mouth 2 (two) times daily. (Patient taking differently: Take 40 mg by mouth daily as needed (acid reflux). ) 60 tablet 11  . Biotin 5000 MCG CAPS Take 5,000 capsules by mouth daily.     . carvedilol (COREG) 3.125 MG tablet Take 1 tablet (3.125 mg total) by mouth 2 (two) times daily with a meal. 180 tablet 3  . Coenzyme Q10 (CO Q 10) 10 MG CAPS Take 1 tablet by mouth daily.     Marland Kitchen MAGNESIUM OXIDE PO Take 4 tablets by mouth 2 (two) times daily.     . meclizine (ANTIVERT) 12.5 MG tablet Take 1 tablet (12.5 mg total) by mouth 2 (two) times daily as needed for dizziness. 20 tablet 0  . naproxen (NAPROSYN) 500 MG tablet Take 1 tablet (500 mg total) by mouth every 12 (twelve) hours as needed. 30 tablet 3  . NONFORMULARY OR COMPOUNDED ITEM Place 0.5 Troches under the tongue daily. TRIEST (8-1-1)/PROG/TEST (POLY) (CINNAMON)    . Nutritional Supplements (DHEA PO) Take 5 mg by  mouth every morning.     . thyroid (ARMOUR) 65 MG tablet Take 65 mg by mouth daily. Nature brand    . vitamin A (RA VITAMIN A) 10000 UNIT capsule Take 10,000 Units by mouth daily.      . Zinc 25 MG TABS Take 1 tablet by mouth daily.      No facility-administered medications prior to visit.     ROS Review of Systems  Constitutional: Negative for activity change, appetite change, fatigue and unexpected weight change.  HENT: Negative.  Negative for trouble swallowing.   Eyes: Negative.  Negative for visual disturbance.  Respiratory: Negative for cough, choking, chest tightness, shortness of breath and stridor.   Cardiovascular: Negative for chest pain, palpitations and leg swelling.  Gastrointestinal: Negative.  Negative for abdominal pain and nausea.  Endocrine: Negative.   Genitourinary: Negative.  Negative for difficulty urinating.  Musculoskeletal: Negative.  Negative for back pain and neck pain.  Skin: Negative.  Negative for color change and rash.  Allergic/Immunologic: Negative.   Neurological: Negative for dizziness, syncope, light-headedness and numbness.  Hematological: Negative.   Psychiatric/Behavioral: Negative.     Objective:  BP 120/68 (BP Location: Left Arm, Patient Position: Sitting, Cuff Size: Normal)   Pulse  70   Temp 98.7 F (37.1 C) (Oral)   Resp 16   Ht 5' (1.524 m)   Wt 127 lb (57.6 kg)   SpO2 97%   BMI 24.80 kg/m   BP Readings from Last 3 Encounters:  09/20/16 120/68  02/27/16 118/70  11/27/15 120/68    Wt Readings from Last 3 Encounters:  09/20/16 127 lb (57.6 kg)  02/27/16 131 lb 6 oz (59.6 kg)  11/27/15 129 lb (58.5 kg)    Physical Exam  Constitutional: She is oriented to person, place, and time. No distress.  HENT:  Mouth/Throat: Oropharynx is clear and moist. No oropharyngeal exudate.  Eyes: Conjunctivae are normal. Right eye exhibits no discharge. Left eye exhibits no discharge. No scleral icterus.  Neck: Normal range of motion. Neck  supple. No JVD present. No tracheal deviation present. No thyromegaly present.  Cardiovascular: Normal rate, regular rhythm, normal heart sounds and intact distal pulses.  Exam reveals no gallop and no friction rub.   No murmur heard. Pulmonary/Chest: Effort normal and breath sounds normal. No stridor. No respiratory distress. She has no wheezes. She has no rales. She exhibits no tenderness.  Abdominal: Soft. Bowel sounds are normal. She exhibits no distension and no mass. There is no tenderness. There is no rebound and no guarding.  Musculoskeletal: Normal range of motion. She exhibits no edema, tenderness or deformity.  Lymphadenopathy:    She has no cervical adenopathy.  Neurological: She is oriented to person, place, and time.  Skin: Skin is warm and dry. No rash noted. She is not diaphoretic. No erythema. No pallor.  Vitals reviewed.   Lab Results  Component Value Date   WBC 4.7 04/19/2015   HGB 13.5 04/19/2015   HGB 14.6 04/19/2015   HCT 38.8 04/19/2015   HCT 43.0 04/19/2015   PLT 204 04/19/2015   GLUCOSE 91 07/29/2015   CHOL 216 (H) 11/27/2015   TRIG 53.0 11/27/2015   HDL 56.10 11/27/2015   LDLDIRECT 130.0 08/03/2011   LDLCALC 149 (H) 11/27/2015   ALT 15 10/13/2014   AST 19 10/13/2014   NA 139 07/29/2015   K 4.4 07/29/2015   CL 104 07/29/2015   CREATININE 0.78 07/29/2015   BUN 12 07/29/2015   CO2 27 07/29/2015   TSH 1.77 11/27/2015   HGBA1C 5.3 07/29/2015    US Breast Ltd Uni Left Inc Axilla  Result Date: 01/07/2016 CLINICAL DATA:  74 year old female presenting for screening recall of a possible left breast mass. The patient states that she is on a DHEA and has been on this since her hysterectomy in her 45s. EXAM: DIGITAL DIAGNOSTIC LEFT MAMMOGRAM WITH 3D TOMOSYNTHESIS AND CAD LEFT BREAST ULTRASOUND COMPARISON:  Previous exam(s). ACR Breast Density Category c: The breast tissue is heterogeneously dense, which may obscure small masses. FINDINGS: In the lateral left  breast, middle depth there is a circumscribed oval mass measuring approximately 2.3 cm mammographically. No other suspicious calcifications, masses or areas of distortion are seen in the left breast. Mammographic images were processed with CAD. Physical exam of the lateral left breast demonstrates a firm mobile palpable mass at the 3 o'clock position. Ultrasound targeted to the palpable area at 3 o'clock demonstrates a circumscribed anechoic oval mass with increased posterior acoustic enhancement measuring 2.2 x 1.1 x 2.6 cm. This corresponds with the mammographic mass of concern. IMPRESSION: 1. The mass identified on mammography corresponds with a benign cyst in the left breast at 3 o'clock. 2. No mammographic or targeted sonographic evidence of  left breast malignancy. RECOMMENDATION: Screening mammogram in one year.(Code:SM-B-01Y) I have discussed the findings and recommendations with the patient. Results were also provided in writing at the conclusion of the visit. If applicable, a reminder letter will be sent to the patient regarding the next appointment. BI-RADS CATEGORY  2: Benign. Electronically Signed   By: Ammie Ferrier M.D.   On: 01/07/2016 11:16   Mm Diag Breast Tomo Uni Left  Result Date: 01/07/2016 CLINICAL DATA:  75 year old female presenting for screening recall of a possible left breast mass. The patient states that she is on a DHEA and has been on this since her hysterectomy in her 79s. EXAM: DIGITAL DIAGNOSTIC LEFT MAMMOGRAM WITH 3D TOMOSYNTHESIS AND CAD LEFT BREAST ULTRASOUND COMPARISON:  Previous exam(s). ACR Breast Density Category c: The breast tissue is heterogeneously dense, which may obscure small masses. FINDINGS: In the lateral left breast, middle depth there is a circumscribed oval mass measuring approximately 2.3 cm mammographically. No other suspicious calcifications, masses or areas of distortion are seen in the left breast. Mammographic images were processed with CAD. Physical  exam of the lateral left breast demonstrates a firm mobile palpable mass at the 3 o'clock position. Ultrasound targeted to the palpable area at 3 o'clock demonstrates a circumscribed anechoic oval mass with increased posterior acoustic enhancement measuring 2.2 x 1.1 x 2.6 cm. This corresponds with the mammographic mass of concern. IMPRESSION: 1. The mass identified on mammography corresponds with a benign cyst in the left breast at 3 o'clock. 2. No mammographic or targeted sonographic evidence of left breast malignancy. RECOMMENDATION: Screening mammogram in one year.(Code:SM-B-01Y) I have discussed the findings and recommendations with the patient. Results were also provided in writing at the conclusion of the visit. If applicable, a reminder letter will be sent to the patient regarding the next appointment. BI-RADS CATEGORY  2: Benign. Electronically Signed   By: Ammie Ferrier M.D.   On: 01/07/2016 11:16    Assessment & Plan:   Gavyn was seen today for palpitations.  Diagnoses and all orders for this visit:  Paresthesias/numbness- her RPR is negative -     RPR; Future  Tachycardia- her sxs are well controlled, she saw cardiology about 2 years ago and was advised to have nn event monitor completed but she has decided not to do that, will cont carvedilol at the current dose -     carvedilol (COREG) 3.125 MG tablet; Take 1 tablet (3.125 mg total) by mouth 2 (two) times daily with a meal.   I have discontinued Ms. Russi's Co Q 10, vitamin A, Zinc, Nutritional Supplements (DHEA PO), Biotin, MAGNESIUM OXIDE PO, NONFORMULARY OR COMPOUNDED ITEM, naproxen, and meclizine. I am also having her maintain her Vitamin B-12 CR, Fish Oil, Vitamin D3, CALCIUM PO, pantoprazole, aspirin, ALPRAZolam, thyroid, and carvedilol.  Meds ordered this encounter  Medications  . thyroid (NP THYROID) 60 MG tablet    Sig: Take 60 mg by mouth daily before breakfast.  . carvedilol (COREG) 3.125 MG tablet    Sig: Take  1 tablet (3.125 mg total) by mouth 2 (two) times daily with a meal.    Dispense:  360 tablet    Refill:  1     Follow-up: Return in about 6 months (around 03/21/2017).  Scarlette Calico, MD

## 2016-09-20 NOTE — Patient Instructions (Signed)

## 2016-09-20 NOTE — Progress Notes (Signed)
Pre visit review using our clinic review tool, if applicable. No additional management support is needed unless otherwise documented below in the visit note. 

## 2016-09-21 ENCOUNTER — Encounter: Payer: Self-pay | Admitting: Internal Medicine

## 2016-09-21 LAB — RPR

## 2016-09-22 DIAGNOSIS — Z23 Encounter for immunization: Secondary | ICD-10-CM | POA: Diagnosis not present

## 2016-10-08 ENCOUNTER — Ambulatory Visit (INDEPENDENT_AMBULATORY_CARE_PROVIDER_SITE_OTHER): Payer: Medicare Other | Admitting: Physician Assistant

## 2016-10-08 ENCOUNTER — Encounter: Payer: Self-pay | Admitting: Physician Assistant

## 2016-10-08 VITALS — BP 110/74 | HR 71 | Ht 60.0 in | Wt 126.0 lb

## 2016-10-08 DIAGNOSIS — R0789 Other chest pain: Secondary | ICD-10-CM

## 2016-10-08 DIAGNOSIS — E785 Hyperlipidemia, unspecified: Secondary | ICD-10-CM

## 2016-10-08 DIAGNOSIS — R002 Palpitations: Secondary | ICD-10-CM

## 2016-10-08 DIAGNOSIS — K219 Gastro-esophageal reflux disease without esophagitis: Secondary | ICD-10-CM

## 2016-10-08 MED ORDER — NITROGLYCERIN 0.4 MG SL SUBL: 0.4000 mg | SUBLINGUAL_TABLET | SUBLINGUAL | 3 refills | Status: DC | PRN

## 2016-10-08 NOTE — Progress Notes (Signed)
Cardiology Office Note:    Date:  10/08/2016   ID:  Mckinley, Coln 07-04-1941, MRN [id number]  PCP:  Scarlette Calico, MD  Cardiologist:  Dr. Dorris Carnes   Electrophysiologist:  n/a  Referring MD: Janith Lima, MD   Chief Complaint  Patient presents with  . Jaw Pain    History of Present Illness:    RYELLE FINLAN is a 75 y.o. female with a hx of Chest pain, GERD, esophageal stricture status post dilatation in 2011, hypothyroidism and prior TIA. Nuclear stress test in 6/15 demonstrated no ischemia.  Last seen here in 12/15 by me. She had palpitations. Echocardiogram was normal. Event monitor demonstrated no arrhythmias.  She presents today with complaints of neck and jaw discomfort over the past week. She woke up with symptoms last Thursday.  She notes a tightness.  She also notes some L arm discomfort and some back pain.  The symptoms seem better with activity.  She still goes to Pilates 2 days a week and walks on the treadmill for 1-1.5 miles 2-4 days a week.  She denies exertional symptoms.  She has some chest pain that is more typical of indigestion.  She denies orthopnea, PND, edema, syncope or near syncope.  She denies palpitations.  She gets relief of her symptoms with taking ASA and a Xanax.    Prior CV studies that were reviewed today include:    Echo 11/08/14 EF 55-60, no RWMA, normal diastolic function, MAC  Event Monitor 12/15 NSR, no arrhythmias  Nuclear (04/2014):  No ischemia, EF 85%, normal study  Nuclear (04/2011): No ischemia, EF 101%   Carotid US (08/2013):  Normal carotids bilat.  ABI (03/2013):  Normal  Past Medical History:  Diagnosis Date  . CHEST PAIN 04/30/2009  . COMMON MIGRAINE 04/30/2009  . CONSTIPATION 04/30/2009  . ESOPHAGEAL STRICTURE 04/30/2009  . GERD 04/30/2009  . HIATAL HERNIA 04/30/2009  . Hx of cardiovascular stress test    ETT-Myoview (04/2014):  No ischemia, EF 85%; Normal  . Hx of echocardiogram    Echo (12/15):  EF 55-60%, no  RWMA, normal diast function, MAC, LA 33 mm  . Hyperlipidemia   . HYPOTHYROIDISM 04/30/2009  . Irritable bowel syndrome 04/30/2009  . RAYNAUD'S DISEASE 04/30/2009    Past Surgical History:  Procedure Laterality Date  . ABDOMINAL HYSTERECTOMY  1976  . COLONOSCOPY    . Luck  . foot surgury  1997  . RHINOPLASTY  1980  . TONSILLECTOMY  1990  . UPPER GASTROINTESTINAL ENDOSCOPY      Current Medications: Current Meds  Medication Sig  . ALPRAZolam (XANAX) 0.25 MG tablet TAKE ONE TABLET BY MOUTH THREE TIMES DAILY AS NEEDED FOR SLEEP OR ANXIETY  . aspirin 81 MG tablet Take 162 mg by mouth daily.  Marland Kitchen CALCIUM PO Take 1-2 capsules by mouth at bedtime.   . carvedilol (COREG) 3.125 MG tablet Take 1 tablet (3.125 mg total) by mouth 2 (two) times daily with a meal.  . Cholecalciferol (VITAMIN D3) 5000 UNITS TABS Take 1 tablet by mouth daily.   . Cyanocobalamin (VITAMIN B-12 CR) 1500 MCG TBCR Take 1 tablet by mouth 3 (three) times a week.   . Omega-3 Fatty Acids (FISH OIL) 1000 MG CAPS Take 2 capsules by mouth daily.   . pantoprazole (PROTONIX) 40 MG tablet Take 1 tablet (40 mg total) by mouth 2 (two) times daily. (Patient taking differently: Take 40 mg by mouth daily as needed (acid reflux). )  .  thyroid (NP THYROID) 60 MG tablet Take 60 mg by mouth daily before breakfast.     Allergies:   Patient has no known allergies.   Social History   Social History  . Marital status: Married    Spouse name: N/A  . Number of children: 2  . Years of education: N/A   Occupational History  . Sales promotion account executive Retired   Social History Main Topics  . Smoking status: Former Smoker    Quit date: 10/27/1984  . Smokeless tobacco: Never Used     Comment: quit 1985  . Alcohol use 0.0 oz/week     Comment: 1-2 per week  . Drug use: No  . Sexual activity: Not Currently    Birth control/ protection: Post-menopausal   Other Topics Concern  . None   Social History Narrative    Patients gets regular exercise.   Patient lives alone in a one story home with a basement.  She is a widow.  Has 2 children.  Retired from [employer].  Education: some college.     Family History:  The patient's family history includes Cancer in her paternal aunt and sister; Diabetes in her maternal aunt, maternal grandmother, and maternal uncle; Heart attack in her father; Heart disease in her father, paternal grandfather, and paternal grandmother; Migraines in her daughter; Stroke in her cousin.   ROS:   Please see the history of present illness.    Review of Systems  Cardiovascular: Positive for chest pain.  Musculoskeletal: Positive for back pain.   All other systems reviewed and are negative.   EKGs/Labs/Other Test Reviewed:    EKG:  EKG is   ordered today.  The ekg ordered today demonstrates NSR, HR 71, normal axis, septal Q waves, QTc 393 ms, no change since prior tracing dated 11/05/14  Recent Labs: 11/27/2015: TSH 1.77   Recent Lipid Panel    Component Value Date/Time   CHOL 216 (H) 11/27/2015 0910   TRIG 53.0 11/27/2015 0910   HDL 56.10 11/27/2015 0910   CHOLHDL 4 11/27/2015 0910   VLDL 10.6 11/27/2015 0910   LDLCALC 149 (H) 11/27/2015 0910   LDLDIRECT 130.0 08/03/2011 0851     Physical Exam:    VS:  BP 110/74 (BP Location: Left Arm)   Pulse 71   Ht 5' (1.524 m)   Wt 126 lb (57.2 kg)   BMI 24.61 kg/m     Wt Readings from Last 3 Encounters:  10/08/16 126 lb (57.2 kg)  09/20/16 127 lb (57.6 kg)  02/27/16 131 lb 6 oz (59.6 kg)     Physical Exam  Constitutional: She is oriented to person, place, and time. She appears well-developed and well-nourished. No distress.  HENT:  Head: Normocephalic and atraumatic.  Eyes: No scleral icterus.  Neck: No JVD present. Carotid bruit is not present. No thyromegaly present.  Cardiovascular: Normal rate, regular rhythm, S1 normal and S2 normal.   No murmur heard. Pulmonary/Chest: Effort normal. She has no  wheezes. She has no rales.  Abdominal: Soft. There is no tenderness.  Musculoskeletal: She exhibits no edema.  Neurological: She is alert and oriented to person, place, and time.  Skin: Skin is warm and dry.  Psychiatric: She has a normal mood and affect.    ASSESSMENT:    1. Other chest pain   2. Gastroesophageal reflux disease without esophagitis   3. Hyperlipidemia with target LDL less than 130   4. Palpitations    PLAN:    In order  of problems listed above:  1. Chest Pain - She mainly has jaw pain and L arm pain.  She has typical and atypical features.  She is able to exercise without symptoms which is reassuring.  She is under a great deal of stress.  She is moving with her granddaughter to the [place] so that she can attend [employer].  She will be there for 2 years and is going through a lot right now to get ready.  I suspect her symptoms are related to anxiety.  Her ECG is normal.  However, she does have a FHx of CAD.  It has been > 2 years since her last assessment for ischemia.  -  Arrange ETT-Myoview  -  Will give Rx for prn NTG to use in case she has severe symptoms.   2. GERD - Continue proton pump inhibitor.   3. HL - Managed by PCP.  4. Palpitations - Controlled on beta-blocker.   Medication Adjustments/Labs and Tests Ordered: Current medicines are reviewed at length with the patient today.  Concerns regarding medicines are outlined above.  Medication changes, Labs and Tests ordered today are outlined in the Patient Instructions noted below. Patient Instructions  Medication Instructions:  1. AN RX FOR NITROGLYCERIN HAS BEEN SENT IN  AND YOU HAVE BEEN ADVISED AS TO HOW AND WHEN TO USE NTG 2. CONTINUE ALL OTHER MEDICATIONS AS PRESCRIBED   Labwork: NONE  Testing/Procedures: Your physician has requested that you have en exercise stress myoview. For further information please visit [email or web address]. Please follow instruction sheet, as given.  Follow-Up: YOU  WILL NEED A FOLLOW UP WITH DR. ROSS ONCE YOU ARE BACK IN [place]; PLEASE CALL OUR WHEN YOU ARE BACK IN THE [place].   Any Other Special Instructions Will Be Listed Below (If Applicable).  If you need a refill on your cardiac medications before your next appointment, please call your pharmacy.  Signed, Richardson Dopp, PA-C  10/08/2016 10:08 AM    [facility] [place], [place],   [address] Phone: [phone]; Fax: [phone]

## 2016-10-08 NOTE — Patient Instructions (Addendum)
Medication Instructions:  1. AN RX FOR NITROGLYCERIN HAS BEEN SENT IN  AND YOU HAVE BEEN ADVISED AS TO HOW AND WHEN TO USE NTG 2. CONTINUE ALL OTHER MEDICATIONS AS PRESCRIBED   Labwork: NONE  Testing/Procedures: Your physician has requested that you have en exercise stress myoview. For further information please visit [email or web address]. Please follow instruction sheet, as given.  Follow-Up: YOU WILL NEED A FOLLOW UP WITH DR. ROSS ONCE YOU ARE BACK IN THE STATES; PLEASE CALL OUR WHEN YOU ARE BACK IN THE [place].   Any Other Special Instructions Will Be Listed Below (If Applicable).  If you need a refill on your cardiac medications before your next appointment, please call your pharmacy.

## 2016-10-10 IMAGING — MR MR HEAD W/O CM
11 series · 48 of 48 positions shown · non-contrast
Comparison: MRI brain 07/12/2014.

CLINICAL DATA: Headaches superiorly and on the right. Left-sided
face feels cold at times.

EXAM:
MRI HEAD WITHOUT CONTRAST
TECHNIQUE: Multiplanar, multiecho pulse sequences of the brain and surrounding
structures were obtained without intravenous contrast.

[Series 2: T1 · sagittal · 5.0mm · 0.45mm/px · 3 of 21 slices shown]
[im 1/21]
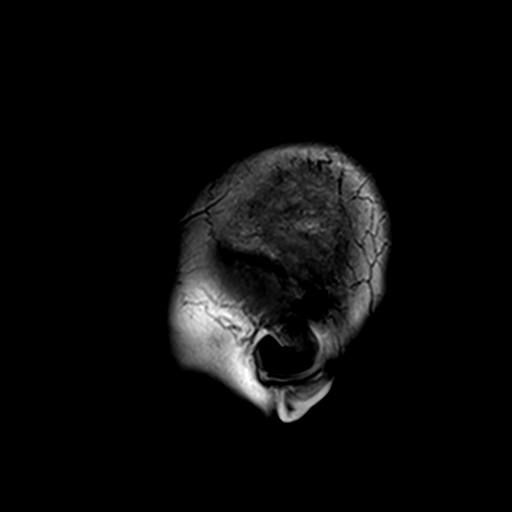
[im 11/21]
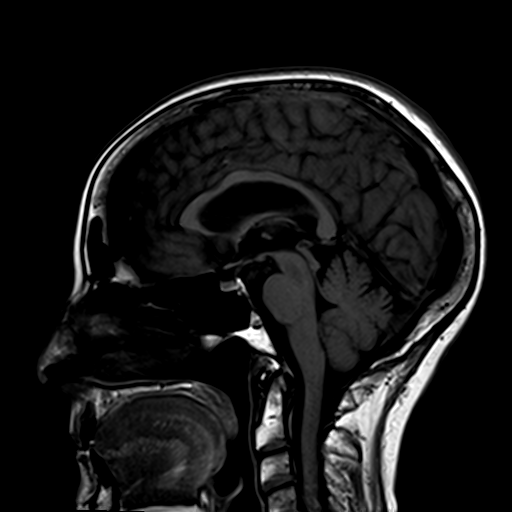
[im 21/21]
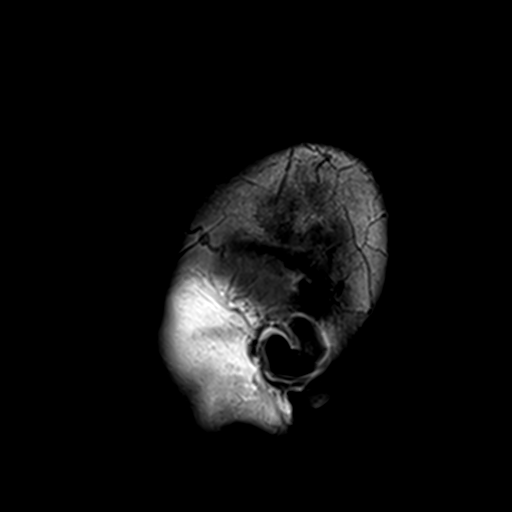

[Series 3: DWI · axial · 3.0mm · 1.80mm/px · z∈[-83,+64]mm · 9 of 100 slices shown (1 of 4)]
[im 1/100]
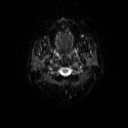
[im 13/100]
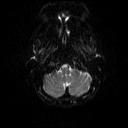
[im 25/100]
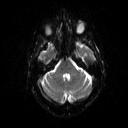
[im 38/100]
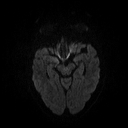
[im 50/100]
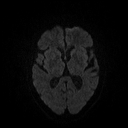
[im 62/100]
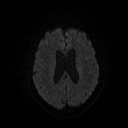
[im 75/100]
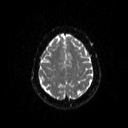
[im 87/100]
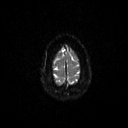
[im 100/100]
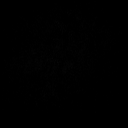

[Series 4: DWI · axial · 3.0mm · 1.80mm/px · z∈[-83,+64]mm · 5 of 50 slices shown (2 of 4)]
[im 1/50]
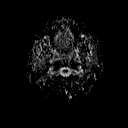
[im 13/50]
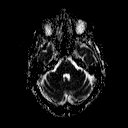
[im 25/50]
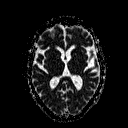
[im 37/50]
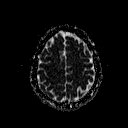
[im 50/50]
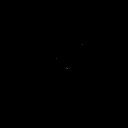

[Series 6: swi_images · axial · 2.0mm · 0.90mm/px · z∈[-88,+70]mm · 7 of 80 slices shown]
[im 1/80]
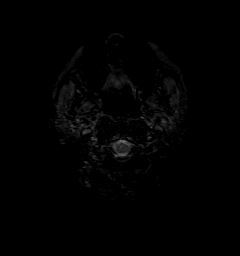
[im 14/80]
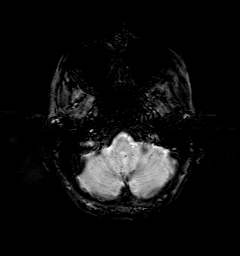
[im 27/80]
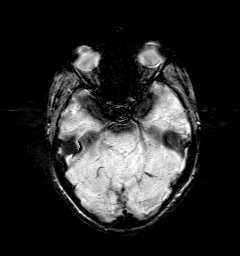
[im 40/80]
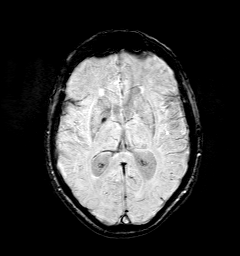
[im 53/80]
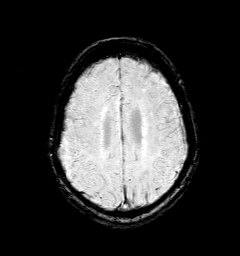
[im 66/80]
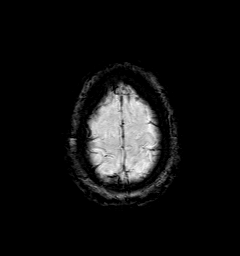
[im 80/80]
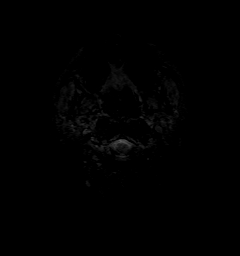

[Series 7: DWI · coronal · 5.0mm · 1.80mm/px · 6 of 68 slices shown (3 of 4)]
[im 1/68]
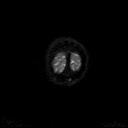
[im 14/68]
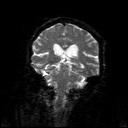
[im 27/68]
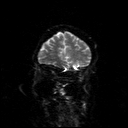
[im 41/68]
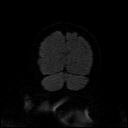
[im 54/68]
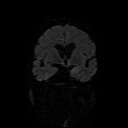
[im 68/68]
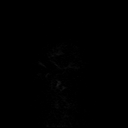

[Series 8: DWI · coronal · 5.0mm · 1.80mm/px · 3 of 34 slices shown (4 of 4)]
[im 1/34]
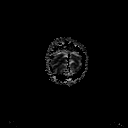
[im 17/34]
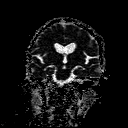
[im 34/34]
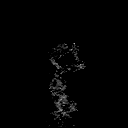

[Series 9: T2 · axial · 5.0mm · 0.51mm/px · z∈[-80,+61]mm · 2 of 22 slices shown (1 of 2)]
[im 1/22]
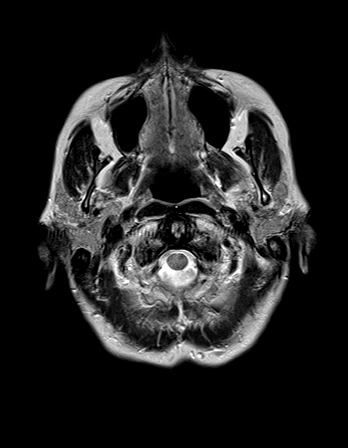
[im 22/22]
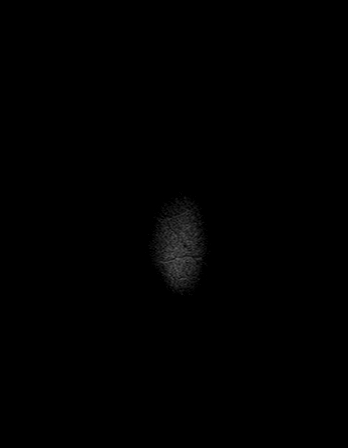

[Series 10: FLAIR · axial · 5.0mm · 0.45mm/px · z∈[-80,+62]mm · 2 of 22 slices shown (1 of 2)]
[im 1/22]
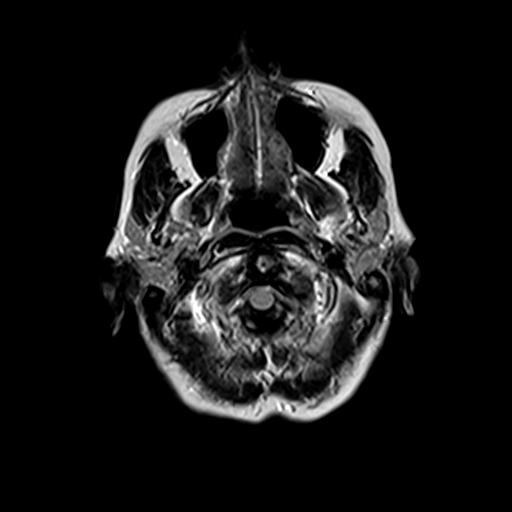
[im 22/22]
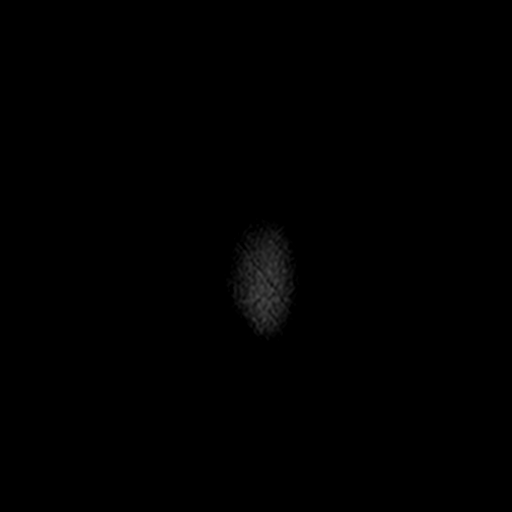

[Series 11: t1_mpr_tra · axial · 2.0mm · 0.45mm/px · z∈[-88,+70]mm · 7 of 80 slices shown]
[im 1/80]
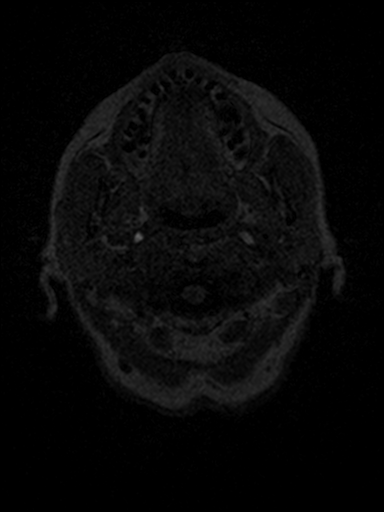
[im 14/80]
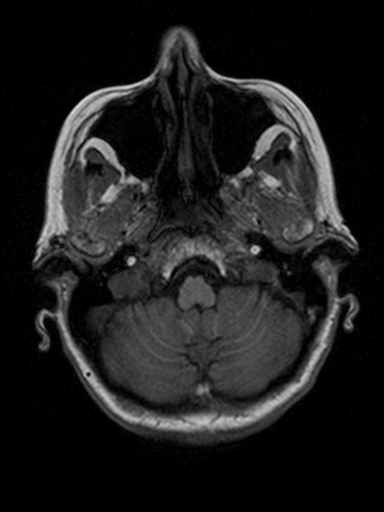
[im 27/80]
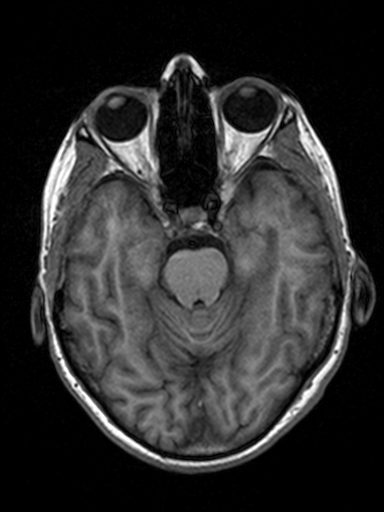
[im 40/80]
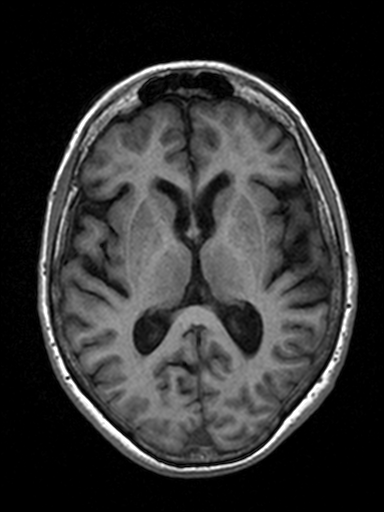
[im 53/80]
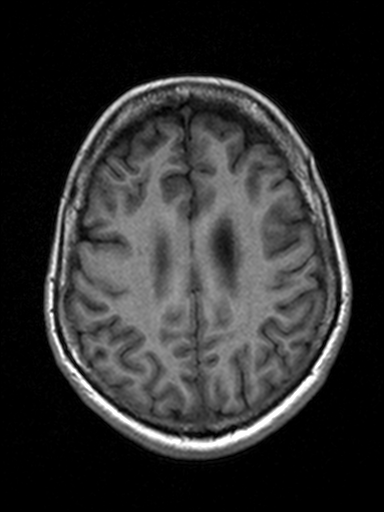
[im 66/80]
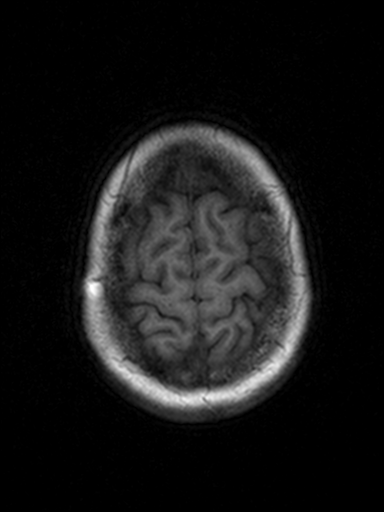
[im 80/80]
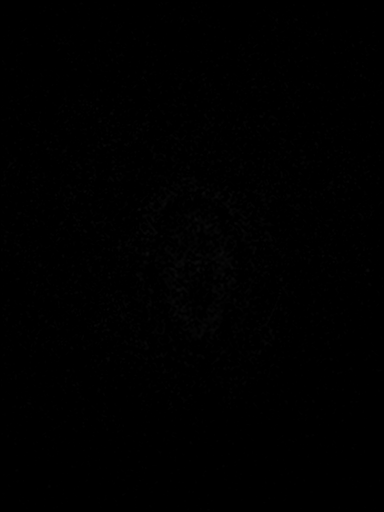

[Series 12: T2 · coronal · 5.0mm · 0.45mm/px · 2 of 25 slices shown (2 of 2)]
[im 1/25]
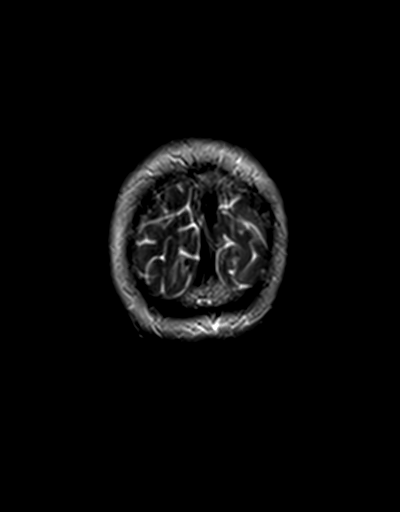
[im 25/25]
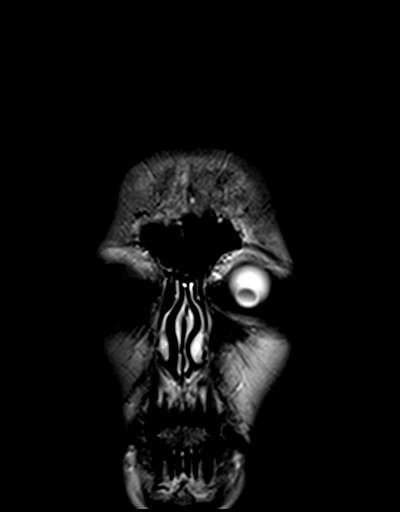

[Series 13: FLAIR · sagittal · 5.0mm · 0.45mm/px · 2 of 22 slices shown (2 of 2)]
[im 1/22]
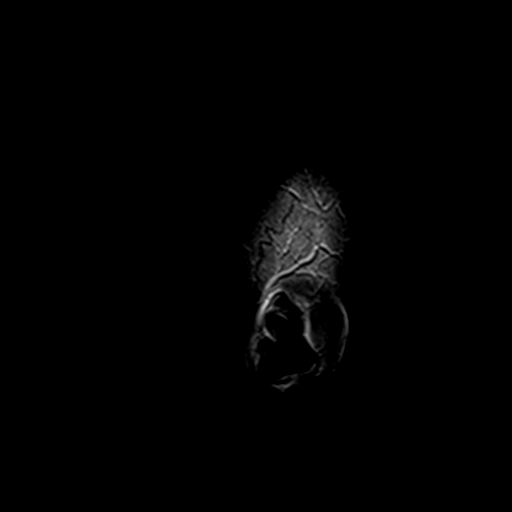
[im 22/22]
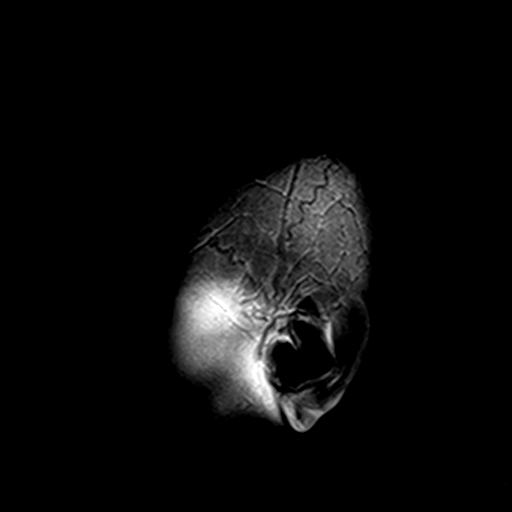

[48 of 48 positions shown; findings below may reference images not displayed]

FINDINGS: The diffusion-weighted images demonstrate no evidence for acute or
subacute infarction. Mild atrophy and white matter changes within
normal limits for age. Findings are similar to the prior exam. There
is no significant interval change. The ventricles are proportionate
to the degree of atrophy. Insert pass fluid

Flow is present in the major intracranial arteries. The globes and
orbits are intact. The paranasal sinuses and mastoid air cells are
clear.

The skullbase is within normal limits. Midline structures are
normal.
IMPRESSION: 1. No acute intracranial abnormality or significant interval change.
2. Stable white matter disease. This is likely within normal limits
for age. Similar findings can be seen with chronic migraine type
headaches or vasculitis. There is no evidence for progression.

## 2016-10-11 ENCOUNTER — Other Ambulatory Visit: Payer: Self-pay | Admitting: Internal Medicine

## 2016-10-11 DIAGNOSIS — Z1231 Encounter for screening mammogram for malignant neoplasm of breast: Secondary | ICD-10-CM

## 2016-10-19 ENCOUNTER — Telehealth (HOSPITAL_COMMUNITY): Payer: Self-pay | Admitting: *Deleted

## 2016-10-19 ENCOUNTER — Ambulatory Visit: Payer: Medicare Other | Admitting: Physician Assistant

## 2016-10-19 DIAGNOSIS — N951 Menopausal and female climacteric states: Secondary | ICD-10-CM | POA: Diagnosis not present

## 2016-10-19 DIAGNOSIS — E279 Disorder of adrenal gland, unspecified: Secondary | ICD-10-CM | POA: Diagnosis not present

## 2016-10-19 DIAGNOSIS — E611 Iron deficiency: Secondary | ICD-10-CM | POA: Diagnosis not present

## 2016-10-19 DIAGNOSIS — E782 Mixed hyperlipidemia: Secondary | ICD-10-CM | POA: Diagnosis not present

## 2016-10-19 DIAGNOSIS — E8849 Other mitochondrial metabolism disorders: Secondary | ICD-10-CM | POA: Diagnosis not present

## 2016-10-19 DIAGNOSIS — E039 Hypothyroidism, unspecified: Secondary | ICD-10-CM | POA: Diagnosis not present

## 2016-10-19 NOTE — Telephone Encounter (Signed)
Patient given detailed instructions per Myocardial Perfusion Study Information Sheet for the test on 10/21/16. Patient notified to arrive 15 minutes early and that it is imperative to arrive on time for appointment to keep from having the test rescheduled.  If you need to cancel or reschedule your appointment, please call the office within 24 hours of your appointment. Failure to do so may result in a cancellation of your appointment, and a $50 no show fee. Patient verbalized understanding. Kirstie Peri

## 2016-10-21 ENCOUNTER — Ambulatory Visit (HOSPITAL_COMMUNITY): Payer: Medicare Other | Attending: Physician Assistant

## 2016-10-21 DIAGNOSIS — R002 Palpitations: Secondary | ICD-10-CM

## 2016-10-21 LAB — MYOCARDIAL PERFUSION IMAGING
CHL CUP NUCLEAR SDS: 4
CHL CUP NUCLEAR SRS: 12
CHL CUP NUCLEAR SSS: 16
CSEPEW: 7 METS
CSEPPHR: 137 {beats}/min
Exercise duration (min): 6 min
Exercise duration (sec): 0 s
LHR: 0.26
LV dias vol: 39 mL (ref 46–106)
LVSYSVOL: 3 mL
MPHR: 146 {beats}/min
NUC STRESS TID: 0.74
Percent HR: 93 %
Rest HR: 76 {beats}/min

## 2016-10-21 MED ORDER — TECHNETIUM TC 99M TETROFOSMIN IV KIT
32.8000 | PACK | Freq: Once | INTRAVENOUS | Status: AC | PRN
  Administered 2016-10-21: 32.8 via INTRAVENOUS
  Filled 2016-10-21: qty 33

## 2016-10-21 MED ORDER — TECHNETIUM TC 99M TETROFOSMIN IV KIT
10.1000 | PACK | Freq: Once | INTRAVENOUS | Status: AC | PRN
  Administered 2016-10-21: 10.1 via INTRAVENOUS
  Filled 2016-10-21: qty 11

## 2016-10-22 ENCOUNTER — Telehealth: Payer: Self-pay | Admitting: *Deleted

## 2016-10-22 ENCOUNTER — Encounter: Payer: Self-pay | Admitting: Physician Assistant

## 2016-10-22 NOTE — Telephone Encounter (Signed)
Pt notified of Myoview results by phone with verbal understanding. Will fax copy of results to PCP Dr. [name].

## 2016-11-02 ENCOUNTER — Encounter: Payer: Self-pay | Admitting: Internal Medicine

## 2016-11-02 DIAGNOSIS — N6320 Unspecified lump in the left breast, unspecified quadrant: Secondary | ICD-10-CM | POA: Diagnosis not present

## 2016-11-02 DIAGNOSIS — N6312 Unspecified lump in the right breast, upper inner quadrant: Secondary | ICD-10-CM | POA: Diagnosis not present

## 2016-11-02 DIAGNOSIS — N6002 Solitary cyst of left breast: Secondary | ICD-10-CM | POA: Diagnosis not present

## 2016-11-02 DIAGNOSIS — N6001 Solitary cyst of right breast: Secondary | ICD-10-CM | POA: Diagnosis not present

## 2016-11-02 LAB — HM MAMMOGRAPHY

## 2016-11-05 ENCOUNTER — Encounter: Payer: Self-pay | Admitting: Internal Medicine

## 2016-11-05 NOTE — Progress Notes (Signed)
Ultrasound showed 1.8 cm simple cyst in there right breast upper inner quadrant and is benign.Marland KitchenMarland Kitchen

## 2017-03-16 ENCOUNTER — Other Ambulatory Visit (INDEPENDENT_AMBULATORY_CARE_PROVIDER_SITE_OTHER): Payer: Medicare Other

## 2017-03-16 ENCOUNTER — Encounter: Payer: Self-pay | Admitting: Internal Medicine

## 2017-03-16 ENCOUNTER — Ambulatory Visit (INDEPENDENT_AMBULATORY_CARE_PROVIDER_SITE_OTHER): Payer: Medicare Other | Admitting: Internal Medicine

## 2017-03-16 VITALS — BP 120/68 | HR 58 | Temp 98.4°F | Resp 16 | Ht 60.0 in | Wt 123.0 lb

## 2017-03-16 DIAGNOSIS — K219 Gastro-esophageal reflux disease without esophagitis: Secondary | ICD-10-CM

## 2017-03-16 DIAGNOSIS — E039 Hypothyroidism, unspecified: Secondary | ICD-10-CM | POA: Diagnosis not present

## 2017-03-16 DIAGNOSIS — E785 Hyperlipidemia, unspecified: Secondary | ICD-10-CM

## 2017-03-16 LAB — COMPREHENSIVE METABOLIC PANEL
ALK PHOS: 36 U/L — AB (ref 39–117)
ALT: 11 U/L (ref 0–35)
AST: 14 U/L (ref 0–37)
Albumin: 4 g/dL (ref 3.5–5.2)
BILIRUBIN TOTAL: 0.5 mg/dL (ref 0.2–1.2)
BUN: 11 mg/dL (ref 6–23)
CALCIUM: 9.8 mg/dL (ref 8.4–10.5)
CO2: 24 mEq/L (ref 19–32)
Chloride: 106 mEq/L (ref 96–112)
Creatinine, Ser: 0.85 mg/dL (ref 0.40–1.20)
GFR: 69.26 mL/min (ref >60.00)
Glucose, Bld: 105 mg/dL — ABNORMAL HIGH (ref 70–99)
POTASSIUM: 4.2 meq/L (ref 3.5–5.1)
Sodium: 136 mEq/L (ref 135–145)
Total Protein: 6.6 g/dL (ref 6.0–8.3)

## 2017-03-16 LAB — CBC WITH DIFFERENTIAL/PLATELET
BASOS PCT: 2.1 % (ref 0.0–3.0)
Basophils Absolute: 0.1 10*3/uL (ref 0.0–0.1)
EOS ABS: 0.1 10*3/uL (ref 0.0–0.7)
Eosinophils Relative: 1.5 % (ref 0.0–5.0)
HEMATOCRIT: 41.2 % (ref 36.0–46.0)
Hemoglobin: 14.3 g/dL (ref 12.0–15.0)
LYMPHS ABS: 1.6 10*3/uL (ref 0.7–4.0)
LYMPHS PCT: 34.5 % (ref 12.0–46.0)
MCHC: 34.8 g/dL (ref 30.0–36.0)
MCV: 92 fl (ref 78.0–100.0)
Monocytes Absolute: 0.4 10*3/uL (ref 0.1–1.0)
Monocytes Relative: 8.6 % (ref 3.0–12.0)
Neutro Abs: 2.4 10*3/uL (ref 1.4–7.7)
Neutrophils Relative %: 53.3 % (ref 43.0–77.0)
PLATELETS: 205 10*3/uL (ref 150.0–400.0)
RBC: 4.47 Mil/uL (ref 3.87–5.11)
RDW: 13.3 % (ref 11.5–15.5)
WBC: 4.6 10*3/uL (ref 4.0–10.5)

## 2017-03-16 LAB — LIPID PANEL
Cholesterol: 215 mg/dL — ABNORMAL HIGH (ref 0–200)
HDL: 63.3 mg/dL (ref >39.00)
LDL Cholesterol: 140 mg/dL — ABNORMAL HIGH (ref 0–99)
NonHDL: 151.51
Total CHOL/HDL Ratio: 3
Triglycerides: 59 mg/dL (ref 0.0–149.0)
VLDL: 11.8 mg/dL (ref 0.0–40.0)

## 2017-03-16 LAB — TSH: TSH: 1.26 u[IU]/mL (ref 0.35–4.50)

## 2017-03-16 NOTE — Progress Notes (Signed)
Pre visit review using our clinic review tool, if applicable. No additional management support is needed unless otherwise documented below in the visit note. 

## 2017-03-16 NOTE — Progress Notes (Signed)
Subjective:  Patient ID: [name], female    DOB: 02-22-1941  Age: 77 y.o. MRN: [id number]  CC: Hypertension; Hyperlipidemia; and Hypothyroidism   HPI [name] presents for f/up - she feels well and offers no complaints.  Outpatient Medications Prior to Visit  Medication Sig Dispense Refill  . ALPRAZolam (XANAX) 0.25 MG tablet TAKE ONE TABLET BY MOUTH THREE TIMES DAILY AS NEEDED FOR SLEEP OR ANXIETY 65 tablet 2  . aspirin 81 MG tablet Take 162 mg by mouth daily.    Marland Kitchen CALCIUM PO Take 1-2 capsules by mouth at bedtime.     . carvedilol (COREG) 3.125 MG tablet Take 1 tablet (3.125 mg total) by mouth 2 (two) times daily with a meal. 360 tablet 1  . Cholecalciferol (VITAMIN D3) 5000 UNITS TABS Take 1 tablet by mouth daily.     . Cyanocobalamin (VITAMIN B-12 CR) 1500 MCG TBCR Take 1 tablet by mouth 3 (three) times a week.     . nitroGLYCERIN (NITROSTAT) 0.4 MG SL tablet Place 1 tablet (0.4 mg total) under the tongue every 5 (five) minutes as needed. 25 tablet 3  . Omega-3 Fatty Acids (FISH OIL) 1000 MG CAPS Take 2 capsules by mouth daily.     . pantoprazole (PROTONIX) 40 MG tablet Take 1 tablet (40 mg total) by mouth 2 (two) times daily. (Patient taking differently: Take 40 mg by mouth daily as needed (acid reflux). ) 60 tablet 11  . thyroid (NP THYROID) 60 MG tablet Take 60 mg by mouth daily before breakfast.     No facility-administered medications prior to visit.     ROS Review of Systems  Constitutional: Negative for appetite change, diaphoresis, fatigue and unexpected weight change.  HENT: Negative.  Negative for trouble swallowing.   Eyes: Negative for visual disturbance.  Respiratory: Negative for cough, chest tightness, shortness of breath and wheezing.   Cardiovascular: Negative for chest pain, palpitations and leg swelling.  Gastrointestinal: Negative for abdominal pain, constipation, diarrhea, nausea and vomiting.  Endocrine: Negative for cold intolerance and  heat intolerance.  Genitourinary: Negative.  Negative for difficulty urinating.  Musculoskeletal: Negative.  Negative for back pain and myalgias.  Skin: Negative.  Negative for color change and rash.  Allergic/Immunologic: Negative.   Neurological: Negative.  Negative for dizziness, weakness and headaches.  Hematological: Negative for adenopathy. Does not bruise/bleed easily.  Psychiatric/Behavioral: Negative.     Objective:  BP 120/68 (BP Location: Left Arm, Patient Position: Sitting, Cuff Size: Normal)   Pulse (!) 58   Temp 98.4 F (36.9 C) (Oral)   Resp 16   Ht 5' (1.524 m)   Wt 123 lb (55.8 kg)   SpO2 96%   BMI 24.02 kg/m   BP Readings from Last 3 Encounters:  03/16/17 120/68  10/08/16 110/74  09/20/16 120/68    Wt Readings from Last 3 Encounters:  03/16/17 123 lb (55.8 kg)  10/08/16 126 lb (57.2 kg)  09/20/16 127 lb (57.6 kg)    Physical Exam  Constitutional: She is oriented to person, place, and time. No distress.  HENT:  Mouth/Throat: Oropharynx is clear and moist. No oropharyngeal exudate.  Eyes: Conjunctivae are normal. Right eye exhibits no discharge. Left eye exhibits no discharge. No scleral icterus.  Neck: Normal range of motion. Neck supple. No JVD present. No tracheal deviation present. No thyromegaly present.  Cardiovascular: Normal rate, regular rhythm, normal heart sounds and intact distal pulses.  Exam reveals no gallop and no friction rub.  No murmur heard. Pulmonary/Chest: Effort normal and breath sounds normal. No stridor. No respiratory distress. She has no wheezes. She has no rales. She exhibits no tenderness.  Abdominal: Soft. Bowel sounds are normal. She exhibits no distension and no mass. There is no tenderness. There is no rebound and no guarding.  Musculoskeletal: Normal range of motion. She exhibits no edema, tenderness or deformity.  Lymphadenopathy:    She has no cervical adenopathy.  Neurological: She is oriented to person, place, and  time.  Skin: Skin is warm and dry. No rash noted. She is not diaphoretic. No erythema. No pallor.  Vitals reviewed.   Lab Results  Component Value Date   WBC 4.6 03/16/2017   HGB 14.3 03/16/2017   HCT 41.2 03/16/2017   PLT 205.0 03/16/2017   GLUCOSE 105 (H) 03/16/2017   CHOL 215 (H) 03/16/2017   TRIG 59.0 03/16/2017   HDL 63.30 03/16/2017   LDLDIRECT 130.0 08/03/2011   LDLCALC 140 (H) 03/16/2017   ALT 11 03/16/2017   AST 14 03/16/2017   NA 136 03/16/2017   K 4.2 03/16/2017   CL 106 03/16/2017   CREATININE 0.85 03/16/2017   BUN 11 03/16/2017   CO2 24 03/16/2017   TSH 1.26 03/16/2017   HGBA1C 5.3 07/29/2015    US Breast Ltd Uni Left Inc Axilla  Result Date: 01/07/2016 CLINICAL DATA:  76 year old female presenting for screening recall of a possible left breast mass. The patient states that she is on a DHEA and has been on this since her hysterectomy in her 35s. EXAM: DIGITAL DIAGNOSTIC LEFT MAMMOGRAM WITH 3D TOMOSYNTHESIS AND CAD LEFT BREAST ULTRASOUND COMPARISON:  Previous exam(s). ACR Breast Density Category c: The breast tissue is heterogeneously dense, which may obscure small masses. FINDINGS: In the lateral left breast, middle depth there is a circumscribed oval mass measuring approximately 2.3 cm mammographically. No other suspicious calcifications, masses or areas of distortion are seen in the left breast. Mammographic images were processed with CAD. Physical exam of the lateral left breast demonstrates a firm mobile palpable mass at the 3 o'clock position. Ultrasound targeted to the palpable area at 3 o'clock demonstrates a circumscribed anechoic oval mass with increased posterior acoustic enhancement measuring 2.2 x 1.1 x 2.6 cm. This corresponds with the mammographic mass of concern. IMPRESSION: 1. The mass identified on mammography corresponds with a benign cyst in the left breast at 3 o'clock. 2. No mammographic or targeted sonographic evidence of left breast malignancy.  RECOMMENDATION: Screening mammogram in one year.(Code:SM-B-01Y) I have discussed the findings and recommendations with the patient. Results were also provided in writing at the conclusion of the visit. If applicable, a reminder letter will be sent to the patient regarding the next appointment. BI-RADS CATEGORY  2: Benign. Electronically Signed   By: Ammie Ferrier M.D.   On: 01/07/2016 11:16   Mm Diag Breast Tomo Uni Left  Result Date: 01/07/2016 CLINICAL DATA:  76 year old female presenting for screening recall of a possible left breast mass. The patient states that she is on a DHEA and has been on this since her hysterectomy in her 25s. EXAM: DIGITAL DIAGNOSTIC LEFT MAMMOGRAM WITH 3D TOMOSYNTHESIS AND CAD LEFT BREAST ULTRASOUND COMPARISON:  Previous exam(s). ACR Breast Density Category c: The breast tissue is heterogeneously dense, which may obscure small masses. FINDINGS: In the lateral left breast, middle depth there is a circumscribed oval mass measuring approximately 2.3 cm mammographically. No other suspicious calcifications, masses or areas of distortion are seen in the left breast.  Mammographic images were processed with CAD. Physical exam of the lateral left breast demonstrates a firm mobile palpable mass at the 3 o'clock position. Ultrasound targeted to the palpable area at 3 o'clock demonstrates a circumscribed anechoic oval mass with increased posterior acoustic enhancement measuring 2.2 x 1.1 x 2.6 cm. This corresponds with the mammographic mass of concern. IMPRESSION: 1. The mass identified on mammography corresponds with a benign cyst in the left breast at 3 o'clock. 2. No mammographic or targeted sonographic evidence of left breast malignancy. RECOMMENDATION: Screening mammogram in one year.(Code:SM-B-01Y) I have discussed the findings and recommendations with the patient. Results were also provided in writing at the conclusion of the visit. If applicable, a reminder letter will be sent to  the patient regarding the next appointment. BI-RADS CATEGORY  2: Benign. Electronically Signed   By: Ammie Ferrier M.D.   On: 01/07/2016 11:16    Assessment & Plan:   [name] was seen today for hypertension, hyperlipidemia and hypothyroidism.  Diagnoses and all orders for this visit:  Acquired hypothyroidism- her TSH is in the normal range, she will remain on the current T4 dose -     TSH; Future  Hyperlipidemia with target LDL less than 130- she has a low Framingham risk score so I do not recommend that she start taking a statin -     Comprehensive metabolic panel; Future -     Lipid panel; Future  Gastroesophageal reflux disease without esophagitis- her sx's are well controlled -     CBC with Differential/Platelet; Future   I am having [name] maintain her Vitamin B-12 CR, Fish Oil, Vitamin D3, CALCIUM PO, pantoprazole, aspirin, ALPRAZolam, thyroid, carvedilol, and nitroGLYCERIN.  No orders of the defined types were placed in this encounter.    Follow-up: Return in about 6 months (around 09/15/2017).  [name] Calico, MD

## 2017-03-16 NOTE — Patient Instructions (Signed)
Hypothyroidism Hypothyroidism is a disorder of the thyroid. The thyroid is a large gland that is located in the lower front of the neck. The thyroid releases hormones that control how the body works. With hypothyroidism, the thyroid does not make enough of these hormones. What are the causes? Causes of hypothyroidism may include:  Viral infections.  Pregnancy.  Your own defense system (immune system) attacking your thyroid.  Certain medicines.  Birth defects.  Past radiation treatments to your head or neck.  Past treatment with radioactive iodine.  Past surgical removal of part or all of your thyroid.  Problems with the gland that is located in the center of your brain (pituitary).  What are the signs or symptoms? Signs and symptoms of hypothyroidism may include:  Feeling as though you have no energy (lethargy).  Inability to tolerate cold.  Weight gain that is not explained by a change in diet or exercise habits.  Dry skin.  Coarse hair.  Menstrual irregularity.  Slowing of thought processes.  Constipation.  Sadness or depression.  How is this diagnosed? Your health care provider may diagnose hypothyroidism with blood tests and ultrasound tests. How is this treated? Hypothyroidism is treated with medicine that replaces the hormones that your body does not make. After you begin treatment, it may take several weeks for symptoms to go away. Follow these instructions at home:  Take medicines only as directed by your health care provider.  If you start taking any new medicines, tell your health care provider.  Keep all follow-up visits as directed by your health care provider. This is important. As your condition improves, your dosage needs may change. You will need to have blood tests regularly so that your health care provider can watch your condition. Contact a health care provider if:  Your symptoms do not get better with treatment.  You are taking thyroid  replacement medicine and: ? You sweat excessively. ? You have tremors. ? You feel anxious. ? You lose weight rapidly. ? You cannot tolerate heat. ? You have emotional swings. ? You have diarrhea. ? You feel weak. Get help right away if:  You develop chest pain.  You develop an irregular heartbeat.  You develop a rapid heartbeat. This information is not intended to replace advice given to you by your health care provider. Make sure you discuss any questions you have with your health care provider. Document Released: 11/08/2005 Document Revised: 04/15/2016 Document Reviewed: 03/26/2014 Elsevier Interactive Patient Education  2017 [employer]  

## 2017-05-31 ENCOUNTER — Other Ambulatory Visit: Payer: Self-pay | Admitting: Dermatology

## 2017-05-31 DIAGNOSIS — Z85828 Personal history of other malignant neoplasm of skin: Secondary | ICD-10-CM | POA: Diagnosis not present

## 2017-05-31 DIAGNOSIS — C44722 Squamous cell carcinoma of skin of right lower limb, including hip: Secondary | ICD-10-CM | POA: Diagnosis not present

## 2017-05-31 DIAGNOSIS — D229 Melanocytic nevi, unspecified: Secondary | ICD-10-CM | POA: Diagnosis not present

## 2017-05-31 DIAGNOSIS — L570 Actinic keratosis: Secondary | ICD-10-CM | POA: Diagnosis not present

## 2017-06-01 DIAGNOSIS — M7062 Trochanteric bursitis, left hip: Secondary | ICD-10-CM | POA: Diagnosis not present

## 2017-06-01 DIAGNOSIS — M5136 Other intervertebral disc degeneration, lumbar region: Secondary | ICD-10-CM | POA: Diagnosis not present

## 2017-06-06 DIAGNOSIS — E039 Hypothyroidism, unspecified: Secondary | ICD-10-CM | POA: Diagnosis not present

## 2017-06-06 DIAGNOSIS — I10 Essential (primary) hypertension: Secondary | ICD-10-CM | POA: Diagnosis not present

## 2017-06-06 DIAGNOSIS — E611 Iron deficiency: Secondary | ICD-10-CM | POA: Diagnosis not present

## 2017-06-14 DIAGNOSIS — H43813 Vitreous degeneration, bilateral: Secondary | ICD-10-CM | POA: Diagnosis not present

## 2017-06-14 DIAGNOSIS — H2513 Age-related nuclear cataract, bilateral: Secondary | ICD-10-CM | POA: Diagnosis not present

## 2017-06-20 ENCOUNTER — Other Ambulatory Visit (INDEPENDENT_AMBULATORY_CARE_PROVIDER_SITE_OTHER): Payer: Medicare Other

## 2017-06-20 ENCOUNTER — Ambulatory Visit (INDEPENDENT_AMBULATORY_CARE_PROVIDER_SITE_OTHER): Payer: Medicare Other | Admitting: Internal Medicine

## 2017-06-20 ENCOUNTER — Encounter: Payer: Self-pay | Admitting: Internal Medicine

## 2017-06-20 VITALS — BP 128/80 | HR 66 | Temp 97.9°F | Resp 16 | Ht 60.0 in | Wt 122.0 lb

## 2017-06-20 DIAGNOSIS — E785 Hyperlipidemia, unspecified: Secondary | ICD-10-CM | POA: Diagnosis not present

## 2017-06-20 DIAGNOSIS — E039 Hypothyroidism, unspecified: Secondary | ICD-10-CM | POA: Diagnosis not present

## 2017-06-20 DIAGNOSIS — R000 Tachycardia, unspecified: Secondary | ICD-10-CM

## 2017-06-20 LAB — LIPID PANEL
CHOL/HDL RATIO: 3
Cholesterol: 199 mg/dL (ref 0–200)
HDL: 61.1 mg/dL (ref >39.00)
LDL CALC: 121 mg/dL — AB (ref 0–99)
NonHDL: 138.06
TRIGLYCERIDES: 86 mg/dL (ref 0.0–149.0)
VLDL: 17.2 mg/dL (ref 0.0–40.0)

## 2017-06-20 LAB — TSH: TSH: 1.27 u[IU]/mL (ref 0.35–4.50)

## 2017-06-20 NOTE — Progress Notes (Signed)
Subjective:  Patient ID: [name], female    DOB: [birth year]  Age: 76 y.o. MRN: [id number]  CC: Hypothyroidism and Hyperlipidemia   HPI MITSUE PEERY presents for f/up - She feels well and offers no complaints.  Outpatient Medications Prior to Visit  Medication Sig Dispense Refill  . ALPRAZolam (XANAX) 0.25 MG tablet TAKE ONE TABLET BY MOUTH THREE TIMES DAILY AS NEEDED FOR SLEEP OR ANXIETY 65 tablet 2  . aspirin 81 MG tablet Take 162 mg by mouth daily.    Marland Kitchen CALCIUM PO Take 1-2 capsules by mouth at bedtime.     . carvedilol (COREG) 3.125 MG tablet Take 1 tablet (3.125 mg total) by mouth 2 (two) times daily with a meal. 360 tablet 1  . Cholecalciferol (VITAMIN D3) 5000 UNITS TABS Take 1 tablet by mouth daily.     . Cyanocobalamin (VITAMIN B-12 CR) 1500 MCG TBCR Take 1 tablet by mouth 3 (three) times a week.     . Omega-3 Fatty Acids (FISH OIL) 1000 MG CAPS Take 2 capsules by mouth daily.     . pantoprazole (PROTONIX) 40 MG tablet Take 1 tablet (40 mg total) by mouth 2 (two) times daily. (Patient taking differently: Take 40 mg by mouth daily as needed (acid reflux). ) 60 tablet 11  . thyroid (NP THYROID) 60 MG tablet Take 60 mg by mouth daily before breakfast.    . nitroGLYCERIN (NITROSTAT) 0.4 MG SL tablet Place 1 tablet (0.4 mg total) under the tongue every 5 (five) minutes as needed. (Patient not taking: Reported on 06/20/2017) 25 tablet 3   No facility-administered medications prior to visit.     ROS Review of Systems  Constitutional: Negative.  Negative for appetite change, diaphoresis, fatigue and unexpected weight change.  HENT: Negative.   Eyes: Negative for visual disturbance.  Respiratory: Negative.  Negative for cough, chest tightness, shortness of breath and wheezing.   Cardiovascular: Negative.  Negative for chest pain, palpitations and leg swelling.  Gastrointestinal: Negative for abdominal pain, constipation, diarrhea, nausea and vomiting.  Endocrine:  Negative.  Negative for cold intolerance and heat intolerance.  Genitourinary: Negative.  Negative for difficulty urinating and urgency.  Musculoskeletal: Negative.  Negative for back pain and myalgias.  Skin: Negative.   Allergic/Immunologic: Negative.   Neurological: Negative.  Negative for dizziness, weakness and light-headedness.  Hematological: Negative for adenopathy. Does not bruise/bleed easily.  Psychiatric/Behavioral: Negative.     Objective:  BP 128/80 (BP Location: Left Arm, Patient Position: Sitting, Cuff Size: Normal)   Pulse 66   Temp 97.9 F (36.6 C) (Oral)   Resp 16   Ht 5' (1.524 m)   Wt 122 lb (55.3 kg)   SpO2 97%   BMI 23.83 kg/m   BP Readings from Last 3 Encounters:  06/20/17 128/80  03/16/17 120/68  10/08/16 110/74    Wt Readings from Last 3 Encounters:  06/20/17 122 lb (55.3 kg)  03/16/17 123 lb (55.8 kg)  10/08/16 126 lb (57.2 kg)    Physical Exam  Constitutional: She is oriented to person, place, and time. No distress.  HENT:  Mouth/Throat: Oropharynx is clear and moist. No oropharyngeal exudate.  Eyes: Conjunctivae are normal. Right eye exhibits no discharge. Left eye exhibits no discharge. No scleral icterus.  Neck: Normal range of motion. Neck supple. No JVD present. No thyromegaly present.  Cardiovascular: Normal rate, regular rhythm and intact distal pulses.  Exam reveals no gallop and no friction rub.   No murmur heard. Pulmonary/Chest:  Effort normal and breath sounds normal. No respiratory distress. She has no wheezes. She has no rales. She exhibits no tenderness.  Abdominal: Soft. Bowel sounds are normal. She exhibits no distension and no mass. There is no tenderness. There is no rebound and no guarding.  Musculoskeletal: Normal range of motion. She exhibits no edema, tenderness or deformity.  Lymphadenopathy:    She has no cervical adenopathy.  Neurological: She is alert and oriented to person, place, and time.  Skin: Skin is warm and  dry. No rash noted. She is not diaphoretic. No erythema. No pallor.  Vitals reviewed.   Lab Results  Component Value Date   WBC 4.6 03/16/2017   HGB 14.3 03/16/2017   HCT 41.2 03/16/2017   PLT 205.0 03/16/2017   GLUCOSE 105 (H) 03/16/2017   CHOL 199 06/20/2017   TRIG 86.0 06/20/2017   HDL 61.10 06/20/2017   LDLDIRECT 130.0 08/03/2011   LDLCALC 121 (H) 06/20/2017   ALT 11 03/16/2017   AST 14 03/16/2017   NA 136 03/16/2017   K 4.2 03/16/2017   CL 106 03/16/2017   CREATININE 0.85 03/16/2017   BUN 11 03/16/2017   CO2 24 03/16/2017   TSH 1.27 06/20/2017   HGBA1C 5.3 07/29/2015    US Breast Ltd Uni Left Inc Axilla  Result Date: 01/07/2016 CLINICAL DATA:  76 year old female presenting for screening recall of a possible left breast mass. The patient states that she is on a DHEA and has been on this since her hysterectomy in her 71s. EXAM: DIGITAL DIAGNOSTIC LEFT MAMMOGRAM WITH 3D TOMOSYNTHESIS AND CAD LEFT BREAST ULTRASOUND COMPARISON:  Previous exam(s). ACR Breast Density Category c: The breast tissue is heterogeneously dense, which may obscure small masses. FINDINGS: In the lateral left breast, middle depth there is a circumscribed oval mass measuring approximately 2.3 cm mammographically. No other suspicious calcifications, masses or areas of distortion are seen in the left breast. Mammographic images were processed with CAD. Physical exam of the lateral left breast demonstrates a firm mobile palpable mass at the 3 o'clock position. Ultrasound targeted to the palpable area at 3 o'clock demonstrates a circumscribed anechoic oval mass with increased posterior acoustic enhancement measuring 2.2 x 1.1 x 2.6 cm. This corresponds with the mammographic mass of concern. IMPRESSION: 1. The mass identified on mammography corresponds with a benign cyst in the left breast at 3 o'clock. 2. No mammographic or targeted sonographic evidence of left breast malignancy. RECOMMENDATION: Screening mammogram in  one year.(Code:SM-B-01Y) I have discussed the findings and recommendations with the patient. Results were also provided in writing at the conclusion of the visit. If applicable, a reminder letter will be sent to the patient regarding the next appointment. BI-RADS CATEGORY  2: Benign. Electronically Signed   By: Ammie Ferrier M.D.   On: 01/07/2016 11:16   Mm Diag Breast Tomo Uni Left  Result Date: 01/07/2016 CLINICAL DATA:  76 year old female presenting for screening recall of a possible left breast mass. The patient states that she is on a DHEA and has been on this since her hysterectomy in her 34s. EXAM: DIGITAL DIAGNOSTIC LEFT MAMMOGRAM WITH 3D TOMOSYNTHESIS AND CAD LEFT BREAST ULTRASOUND COMPARISON:  Previous exam(s). ACR Breast Density Category c: The breast tissue is heterogeneously dense, which may obscure small masses. FINDINGS: In the lateral left breast, middle depth there is a circumscribed oval mass measuring approximately 2.3 cm mammographically. No other suspicious calcifications, masses or areas of distortion are seen in the left breast. Mammographic images were processed with  CAD. Physical exam of the lateral left breast demonstrates a firm mobile palpable mass at the 3 o'clock position. Ultrasound targeted to the palpable area at 3 o'clock demonstrates a circumscribed anechoic oval mass with increased posterior acoustic enhancement measuring 2.2 x 1.1 x 2.6 cm. This corresponds with the mammographic mass of concern. IMPRESSION: 1. The mass identified on mammography corresponds with a benign cyst in the left breast at 3 o'clock. 2. No mammographic or targeted sonographic evidence of left breast malignancy. RECOMMENDATION: Screening mammogram in one year.(Code:SM-B-01Y) I have discussed the findings and recommendations with the patient. Results were also provided in writing at the conclusion of the visit. If applicable, a reminder letter will be sent to the patient regarding the next  appointment. BI-RADS CATEGORY  2: Benign. Electronically Signed   By: Ammie Ferrier M.D.   On: 01/07/2016 11:16    Assessment & Plan:   Stacyann was seen today for hypothyroidism and hyperlipidemia.  Diagnoses and all orders for this visit:  Hypothyroidism, unspecified type- her TSH is in the normal range, she will remain on the current dose of levothyroxine. -     TSH; Future  Hyperlipidemia with target LDL less than 130- her Framingham risk score is up to 10% but she is not willing to take a statin for CV risk reduction, will continue daily aspirin -     Lipid panel; Future  Tachycardia- she's had no recent palpitations and her heart rate is well-controlled on carvedilol.   I am having Ms. Rohrer maintain her Vitamin B-12 CR, Fish Oil, Vitamin D3, CALCIUM PO, pantoprazole, aspirin, ALPRAZolam, thyroid, carvedilol, nitroGLYCERIN, and Nutritional Supplements (DHEA PO).  Meds ordered this encounter  Medications  . Nutritional Supplements (DHEA PO)    Sig: Take 5 mg by mouth. 5 days per week     Follow-up: Return in about 6 months (around 12/21/2017).  Scarlette Calico, MD

## 2017-06-20 NOTE — Patient Instructions (Signed)
Hypothyroidism Hypothyroidism is a disorder of the thyroid. The thyroid is a large gland that is located in the lower front of the neck. The thyroid releases hormones that control how the body works. With hypothyroidism, the thyroid does not make enough of these hormones. What are the causes? Causes of hypothyroidism may include:  Viral infections.  Pregnancy.  Your own defense system (immune system) attacking your thyroid.  Certain medicines.  Birth defects.  Past radiation treatments to your head or neck.  Past treatment with radioactive iodine.  Past surgical removal of part or all of your thyroid.  Problems with the gland that is located in the center of your brain (pituitary).  What are the signs or symptoms? Signs and symptoms of hypothyroidism may include:  Feeling as though you have no energy (lethargy).  Inability to tolerate cold.  Weight gain that is not explained by a change in diet or exercise habits.  Dry skin.  Coarse hair.  Menstrual irregularity.  Slowing of thought processes.  Constipation.  Sadness or depression.  How is this diagnosed? Your health care provider may diagnose hypothyroidism with blood tests and ultrasound tests. How is this treated? Hypothyroidism is treated with medicine that replaces the hormones that your body does not make. After you begin treatment, it may take several weeks for symptoms to go away. Follow these instructions at home:  Take medicines only as directed by your health care provider.  If you start taking any new medicines, tell your health care provider.  Keep all follow-up visits as directed by your health care provider. This is important. As your condition improves, your dosage needs may change. You will need to have blood tests regularly so that your health care provider can watch your condition. Contact a health care provider if:  Your symptoms do not get better with treatment.  You are taking thyroid  replacement medicine and: ? You sweat excessively. ? You have tremors. ? You feel anxious. ? You lose weight rapidly. ? You cannot tolerate heat. ? You have emotional swings. ? You have diarrhea. ? You feel weak. Get help right away if:  You develop chest pain.  You develop an irregular heartbeat.  You develop a rapid heartbeat. This information is not intended to replace advice given to you by your health care provider. Make sure you discuss any questions you have with your health care provider. Document Released: 11/08/2005 Document Revised: 04/15/2016 Document Reviewed: 03/26/2014 Elsevier Interactive Patient Education  2017 [employer]  

## 2017-06-30 ENCOUNTER — Ambulatory Visit (INDEPENDENT_AMBULATORY_CARE_PROVIDER_SITE_OTHER): Payer: Medicare Other | Admitting: *Deleted

## 2017-06-30 VITALS — BP 128/82 | HR 82 | Resp 20 | Ht 60.0 in | Wt 124.0 lb

## 2017-06-30 DIAGNOSIS — Z0000 Encounter for general adult medical examination without abnormal findings: Secondary | ICD-10-CM | POA: Diagnosis not present

## 2017-06-30 NOTE — Progress Notes (Signed)
Pre visit review using our clinic review tool, if applicable. No additional management support is needed unless otherwise documented below in the visit note. 

## 2017-06-30 NOTE — Progress Notes (Addendum)
Subjective:   [name] is a 76 y.o. female who presents for Medicare Annual (Subsequent) preventive examination.  Review of Systems:  No ROS.  Medicare Wellness Visit. Additional risk factors are reflected in the social history.  Cardiac Risk Factors include: advanced age (>30mn, >>52women);dyslipidemia Sleep patterns: feels rested on waking, gets up 1 times nightly to void and sleeps 7-8 hours nightly.    Home Safety/Smoke Alarms: Feels safe in home. Smoke alarms in place.  Living environment; residence and Firearm Safety: 1-story house/ trailer, no firearms. Lives alone, no needs for DME, good support system Seat Belt Safety/Bike Helmet: Wears seat belt.   Counseling:   Eye Exam- appointment yearly, Dr. GKaty FitchDental- appointment every 6 months  Female:   Pap- N/A      Mammo- Last 01/07/16, BI-RADS category 2: benign   Dexa scan- Last 02/09/13, osteopenia       CCS- Last 11/08/11, recall 10 years     Objective:     Vitals: BP 128/82   Pulse 82   Resp 20   Ht 5' (1.524 m)   Wt 124 lb (56.2 kg)   SpO2 97%   BMI 24.22 kg/m   Body mass index is 24.22 kg/m.   Tobacco History  Smoking Status  . Former Smoker  . Quit date: 10/27/1984  Smokeless Tobacco  . Never Used    Comment: quit 1985     Counseling given: Not Answered   Past Medical History:  Diagnosis Date  . CHEST PAIN 04/30/2009  . COMMON MIGRAINE 04/30/2009  . CONSTIPATION 04/30/2009  . ESOPHAGEAL STRICTURE 04/30/2009  . GERD 04/30/2009  . HIATAL HERNIA 04/30/2009  . Hx of cardiovascular stress test    ETT-Myoview (04/2014):  No ischemia, EF 85%; Normal // Myoview 11/17: EF 92, no scar or ischemia, Low Risk  . Hx of echocardiogram    Echo (12/15):  EF 55-60%, no RWMA, normal diast function, MAC, LA 33 mm  . Hyperlipidemia   . HYPOTHYROIDISM 04/30/2009  . Irritable bowel syndrome 04/30/2009  . RAYNAUD'S DISEASE 04/30/2009   Past Surgical History:  Procedure Laterality Date  . ABDOMINAL HYSTERECTOMY  1976    . COLONOSCOPY    [place] [facility]  [place]  . RHINOPLASTY  1980  . TONSILLECTOMY  1990  . UPPER GASTROINTESTINAL ENDOSCOPY     Family History  Problem Relation Age of Onset  . Heart disease Father   . Heart attack Father   . Cancer Sister        breast cancer  . Diabetes Maternal Aunt   . Diabetes Maternal Uncle   . Cancer Paternal Aunt        lung cancer  . Diabetes Maternal Grandmother   . Heart disease Paternal Grandmother   . Heart disease Paternal Grandfather   . Stroke Cousin   . Migraines Daughter   . Colon cancer Neg Hx    History  Sexual Activity  . Sexual activity: Not Currently  . Birth control/ protection: Post-menopausal    Outpatient Encounter Prescriptions as of 06/30/2017  Medication Sig  . ALPRAZolam (XANAX) 0.25 MG tablet TAKE ONE TABLET BY MOUTH THREE TIMES DAILY AS NEEDED FOR SLEEP OR ANXIETY  . aspirin 81 MG tablet Take 162 mg by mouth daily.  .Marland KitchenCALCIUM PO Take 1-2 capsules by mouth at bedtime.   . carvedilol (COREG) 3.125 MG tablet Take 1 tablet (3.125 mg total) by mouth 2 (two) times daily  with a meal.  . Cholecalciferol (VITAMIN D3) 5000 UNITS TABS Take 1 tablet by mouth daily.   . Cyanocobalamin (VITAMIN B-12 CR) 1500 MCG TBCR Take 1 tablet by mouth 3 (three) times a week.   . nitroGLYCERIN (NITROSTAT) 0.4 MG SL tablet Place 1 tablet (0.4 mg total) under the tongue every 5 (five) minutes as needed.  . Nutritional Supplements (DHEA PO) Take 5 mg by mouth. 5 days per week  . Omega-3 Fatty Acids (FISH OIL) 1000 MG CAPS Take 2 capsules by mouth daily.   . pantoprazole (PROTONIX) 40 MG tablet Take 1 tablet (40 mg total) by mouth 2 (two) times daily. (Patient taking differently: Take 40 mg by mouth daily as needed (acid reflux). )  . thyroid (NP THYROID) 60 MG tablet Take 60 mg by mouth daily before breakfast.   No facility-administered encounter medications on file as of 06/30/2017.     Activities of Daily Living In your  present state of health, do you have any difficulty performing the following activities: 06/30/2017  Hearing? N  Vision? N  Difficulty concentrating or making decisions? N  Walking or climbing stairs? N  Dressing or bathing? N  Doing errands, shopping? N  Preparing Food and eating ? N  Using the Toilet? N  In the past six months, have you accidently leaked urine? N  Do you have problems with loss of bowel control? N  Managing your Medications? N  Managing your Finances? N  Housekeeping or managing your Housekeeping? N  Some recent data might be hidden    Patient Care Team: Janith Lima, MD as PCP - General    Assessment:    Physical assessment deferred to PCP.  Exercise Activities and Dietary recommendations Current Exercise Habits: Home exercise routine, Type of exercise: walking;strength training/weights;calisthenics, Time (Minutes): 50, Frequency (Times/Week): 7, Weekly Exercise (Minutes/Week): 350, Intensity: Moderate  Diet (meal preparation, eat out, water intake, caffeinated beverages, dairy products, fruits and vegetables): in general, a "healthy" diet  , well balanced, low fat/ cholesterol, low salt   Goals    . Maintain current health status          Continue to travel, enjoy life, family, friends, church.      Fall Risk Fall Risk  06/30/2017 03/16/2017 02/27/2016 12/01/2015 11/27/2015  Falls in the past year? No No No No No   Depression Screen PHQ 2/9 Scores 06/30/2017 03/16/2017 12/01/2015 11/27/2015  PHQ - 2 Score 0 0 0 0  PHQ- 9 Score 0 1 - -     Cognitive Function       Ad8 score reviewed for issues:  Issues making decisions: no  Less interest in hobbies / activities: no  Repeats questions, stories (family complaining): no  Trouble using ordinary gadgets (microwave, computer, phone):no  Forgets the month or year: no  Mismanaging finances: no  Remembering appts: no  Daily problems with thinking and/or memory: no Ad8 score is= 0    Immunization  History  Administered Date(s) Administered  . Hep A / Hep B 09/09/2016  . Hepatitis A 03/14/2017  . Hepatitis B 10/06/2016, 03/14/2017  . Influenza Split 09/15/2012  . Influenza Whole 08/13/2010, 08/03/2011  . Influenza, High Dose Seasonal PF 09/19/2013, 08/30/2014, 07/29/2015  . Influenza-Unspecified 08/26/2016  . Pneumococcal Conjugate-13 09/30/2014  . Pneumococcal Polysaccharide-23 08/13/2010  . Td 04/30/2008, 09/28/2010  . Tdap 08/30/2016  . Zoster 09/15/2012   Screening Tests Health Maintenance  Topic Date Due  . INFLUENZA VACCINE  06/22/2017  . COLONOSCOPY  11/07/2021  . TETANUS/TDAP  08/30/2026  . DEXA SCAN  Completed  . PNA vac Low Risk Adult  Completed      Plan:     Continue doing brain stimulating activities (puzzles, reading, adult coloring books, staying active) to keep memory sharp.   Continue to eat heart healthy diet (full of fruits, vegetables, whole grains, lean protein, water--limit salt, fat, and sugar intake) and increase physical activity as tolerated.  I have personally reviewed and noted the following in the patient's chart:   . Medical and social history . Use of alcohol, tobacco or illicit drugs  . Current medications and supplements . Functional ability and status . Nutritional status . Physical activity . Advanced directives . List of other physicians . Vitals . Screenings to include cognitive, depression, and falls . Referrals and appointments  In addition, I have reviewed and discussed with patient certain preventive protocols, quality metrics, and best practice recommendations. A written personalized care plan for preventive services as well as general preventive health recommendations were provided to patient.     Michiel Cowboy, RN  06/30/2017  Medical screening examination/treatment/procedure(s) were performed by non-physician practitioner and as supervising physician I was immediately available for consultation/collaboration. I agree  with above. Scarlette Calico, MD

## 2017-06-30 NOTE — Patient Instructions (Signed)
Continue doing brain stimulating activities (puzzles, reading, adult coloring books, staying active) to keep memory sharp.   Continue to eat heart healthy diet (full of fruits, vegetables, whole grains, lean protein, water--limit salt, fat, and sugar intake) and increase physical activity as tolerated.   [name] , Thank you for taking time to come for your Medicare Wellness Visit. I appreciate your ongoing commitment to your health goals. Please review the following plan we discussed and let me know if I can assist you in the future.   These are the goals we discussed: Goals    . Maintain current health status          Continue to travel, enjoy life, family, friends, church.       This is a list of the screening recommended for you and due dates:  Health Maintenance  Topic Date Due  . Flu Shot  06/22/2017  . Colon Cancer Screening  11/07/2021  . Tetanus Vaccine  08/30/2026  . DEXA scan (bone density measurement)  Completed  . Pneumonia vaccines  Completed

## 2017-07-07 DIAGNOSIS — Z124 Encounter for screening for malignant neoplasm of cervix: Secondary | ICD-10-CM | POA: Diagnosis not present

## 2017-10-24 ENCOUNTER — Telehealth: Payer: Self-pay | Admitting: Internal Medicine

## 2017-10-24 NOTE — Telephone Encounter (Signed)
Copied from [place] [id number]. Topic: Inquiry >> Oct 24, 2017 10:25 AM Corie Chiquito, NT wrote: Reason for CRM: Patient has some questions about the new Shingles shot and like to speak with Dr.Jones about it when she comes in on 11-08-2017.

## 2017-11-03 ENCOUNTER — Other Ambulatory Visit: Payer: Self-pay | Admitting: Internal Medicine

## 2017-11-03 DIAGNOSIS — R000 Tachycardia, unspecified: Secondary | ICD-10-CM

## 2017-11-08 ENCOUNTER — Encounter: Payer: Self-pay | Admitting: Internal Medicine

## 2017-11-08 ENCOUNTER — Ambulatory Visit (INDEPENDENT_AMBULATORY_CARE_PROVIDER_SITE_OTHER): Payer: Medicare Other | Admitting: Internal Medicine

## 2017-11-08 ENCOUNTER — Other Ambulatory Visit (INDEPENDENT_AMBULATORY_CARE_PROVIDER_SITE_OTHER): Payer: Medicare Other

## 2017-11-08 VITALS — BP 128/78 | HR 65 | Temp 98.9°F | Resp 16 | Ht 60.0 in | Wt 124.0 lb

## 2017-11-08 DIAGNOSIS — K219 Gastro-esophageal reflux disease without esophagitis: Secondary | ICD-10-CM

## 2017-11-08 DIAGNOSIS — M8580 Other specified disorders of bone density and structure, unspecified site: Secondary | ICD-10-CM | POA: Diagnosis not present

## 2017-11-08 DIAGNOSIS — E785 Hyperlipidemia, unspecified: Secondary | ICD-10-CM | POA: Diagnosis not present

## 2017-11-08 DIAGNOSIS — F410 Panic disorder [episodic paroxysmal anxiety] without agoraphobia: Secondary | ICD-10-CM | POA: Diagnosis not present

## 2017-11-08 DIAGNOSIS — E039 Hypothyroidism, unspecified: Secondary | ICD-10-CM

## 2017-11-08 DIAGNOSIS — Z23 Encounter for immunization: Secondary | ICD-10-CM | POA: Insufficient documentation

## 2017-11-08 DIAGNOSIS — R1010 Upper abdominal pain, unspecified: Secondary | ICD-10-CM

## 2017-11-08 LAB — CBC WITH DIFFERENTIAL/PLATELET
BASOS ABS: 0 10*3/uL (ref 0.0–0.1)
BASOS PCT: 0.7 % (ref 0.0–3.0)
EOS ABS: 0 10*3/uL (ref 0.0–0.7)
Eosinophils Relative: 0.9 % (ref 0.0–5.0)
HEMATOCRIT: 41.7 % (ref 36.0–46.0)
Hemoglobin: 14.1 g/dL (ref 12.0–15.0)
LYMPHS ABS: 1.6 10*3/uL (ref 0.7–4.0)
Lymphocytes Relative: 31.5 % (ref 12.0–46.0)
MCHC: 33.8 g/dL (ref 30.0–36.0)
MCV: 93.7 fl (ref 78.0–100.0)
Monocytes Absolute: 0.4 10*3/uL (ref 0.1–1.0)
Monocytes Relative: 7.6 % (ref 3.0–12.0)
NEUTROS ABS: 2.9 10*3/uL (ref 1.4–7.7)
NEUTROS PCT: 59.3 % (ref 43.0–77.0)
PLATELETS: 215 10*3/uL (ref 150.0–400.0)
RBC: 4.45 Mil/uL (ref 3.87–5.11)
RDW: 13.3 % (ref 11.5–15.5)
WBC: 5 10*3/uL (ref 4.0–10.5)

## 2017-11-08 LAB — LIPID PANEL
CHOL/HDL RATIO: 3
CHOLESTEROL: 183 mg/dL (ref 0–200)
HDL: 58 mg/dL (ref >39.00)
LDL CALC: 115 mg/dL — AB (ref 0–99)
NonHDL: 125.18
TRIGLYCERIDES: 53 mg/dL (ref 0.0–149.0)
VLDL: 10.6 mg/dL (ref 0.0–40.0)

## 2017-11-08 LAB — TSH: TSH: 2.04 u[IU]/mL (ref 0.35–4.50)

## 2017-11-08 MED ORDER — ZOSTER VAC RECOMB ADJUVANTED 50 MCG/0.5ML IM SUSR: 0.5000 mL | Freq: Once | INTRAMUSCULAR | 1 refills | Status: AC

## 2017-11-08 MED ORDER — PANTOPRAZOLE SODIUM 40 MG PO TBEC: 40.0000 mg | DELAYED_RELEASE_TABLET | Freq: Every day | ORAL | 1 refills | Status: DC | PRN

## 2017-11-08 MED ORDER — ALPRAZOLAM 0.25 MG PO TABS: ORAL_TABLET | ORAL | 2 refills | Status: DC

## 2017-11-08 NOTE — Progress Notes (Signed)
Subjective:  Patient ID: [name], female    DOB: 06/07/1941  Age: 76 y.o. MRN: [id number]  CC: Hypothyroidism; Gastroesophageal Reflux; and Hyperlipidemia   HPI DRISANA SCHWEICKERT presents for f/up - She wants me to check her estrogen level.  She tells me her blood pressure has been well controlled.  She has an occasional sensation of anxiety and feeling panicky and wants a refill of Xanax.  She has had no recent episodes of chest pain or shortness of breath.  Outpatient Medications Prior to Visit  Medication Sig Dispense Refill  . aspirin 81 MG tablet Take 162 mg by mouth daily.    Marland Kitchen CALCIUM PO Take 1-2 capsules by mouth at bedtime.     . carvedilol (COREG) 3.125 MG tablet TAKE 1 TABLET TWICE DAILY WITH A MEAL 180 tablet 1  . Cholecalciferol (VITAMIN D3) 5000 UNITS TABS Take 1 tablet by mouth daily.     . Cyanocobalamin (VITAMIN B-12 CR) 1500 MCG TBCR Take 1 tablet by mouth 3 (three) times a week.     . Nutritional Supplements (DHEA PO) Take 5 mg by mouth. 5 days per week    . Omega-3 Fatty Acids (FISH OIL) 1000 MG CAPS Take 2 capsules by mouth daily.     Marland Kitchen thyroid (NP THYROID) 60 MG tablet Take 60 mg by mouth daily before breakfast.    . ALPRAZolam (XANAX) 0.25 MG tablet TAKE ONE TABLET BY MOUTH THREE TIMES DAILY AS NEEDED FOR SLEEP OR ANXIETY 65 tablet 2  . pantoprazole (PROTONIX) 40 MG tablet Take 1 tablet (40 mg total) by mouth 2 (two) times daily. (Patient taking differently: Take 40 mg by mouth daily as needed (acid reflux). ) 60 tablet 11  . nitroGLYCERIN (NITROSTAT) 0.4 MG SL tablet Place 1 tablet (0.4 mg total) under the tongue every 5 (five) minutes as needed. (Patient not taking: Reported on 11/08/2017) 25 tablet 3   No facility-administered medications prior to visit.     ROS Review of Systems  Constitutional: Negative.  Negative for appetite change, diaphoresis, fatigue and unexpected weight change.  HENT: Negative.  Negative for sinus pressure and trouble  swallowing.   Eyes: Negative for visual disturbance.  Respiratory: Negative for cough, chest tightness, shortness of breath and wheezing.   Cardiovascular: Negative.  Negative for chest pain, palpitations and leg swelling.  Gastrointestinal: Negative for abdominal pain, constipation, diarrhea, nausea and vomiting.  Endocrine: Negative.  Negative for cold intolerance and heat intolerance.  Genitourinary: Negative.  Negative for difficulty urinating.  Musculoskeletal: Negative.  Negative for back pain, myalgias and neck pain.  Skin: Negative.  Negative for color change.  Allergic/Immunologic: Negative.   Neurological: Negative.  Negative for dizziness.  Hematological: Negative for adenopathy. Does not bruise/bleed easily.  Psychiatric/Behavioral: Negative for behavioral problems, confusion, decreased concentration, dysphoric mood, hallucinations, self-injury, sleep disturbance and suicidal ideas. The patient is nervous/anxious.     Objective:  BP 128/78 (BP Location: Left Arm, Patient Position: Sitting, Cuff Size: Normal)   Pulse 65   Temp 98.9 F (37.2 C) (Oral)   Resp 16   Ht 5' (1.524 m)   Wt 124 lb (56.2 kg)   SpO2 97%   BMI 24.22 kg/m   BP Readings from Last 3 Encounters:  11/08/17 128/78  06/30/17 128/82  06/20/17 128/80    Wt Readings from Last 3 Encounters:  11/08/17 124 lb (56.2 kg)  06/30/17 124 lb (56.2 kg)  06/20/17 122 lb (55.3 kg)    Physical Exam  Constitutional: She is oriented to person, place, and time.  HENT:  Mouth/Throat: Oropharynx is clear and moist. No oropharyngeal exudate.  Eyes: Conjunctivae are normal. Left eye exhibits no discharge. No scleral icterus.  Neck: Normal range of motion. Neck supple. No JVD present. No thyromegaly present.  Cardiovascular: Normal rate, regular rhythm and intact distal pulses.  No murmur heard. Pulmonary/Chest: Effort normal and breath sounds normal. No respiratory distress. She has no wheezes. She has no rales.    Abdominal: Soft. Bowel sounds are normal. She exhibits no distension and no mass. There is no tenderness. There is no rebound and no guarding.  Musculoskeletal: Normal range of motion. She exhibits no edema or tenderness.  Neurological: She is alert and oriented to person, place, and time.  Skin: Skin is warm and dry. No rash noted. She is not diaphoretic. No erythema. No pallor.  Psychiatric: She has a normal mood and affect. Her behavior is normal. Judgment and thought content normal.  Vitals reviewed.   Lab Results  Component Value Date   WBC 5.0 11/08/2017   HGB 14.1 11/08/2017   HCT 41.7 11/08/2017   PLT 215.0 11/08/2017   GLUCOSE 105 (H) 03/16/2017   CHOL 183 11/08/2017   TRIG 53.0 11/08/2017   HDL 58.00 11/08/2017   LDLDIRECT 130.0 08/03/2011   LDLCALC 115 (H) 11/08/2017   ALT 11 03/16/2017   AST 14 03/16/2017   NA 136 03/16/2017   K 4.2 03/16/2017   CL 106 03/16/2017   CREATININE 0.85 03/16/2017   BUN 11 03/16/2017   CO2 24 03/16/2017   TSH 2.04 11/08/2017   HGBA1C 5.3 07/29/2015    US Breast Ltd Uni Left Inc Axilla  Result Date: 01/07/2016 CLINICAL DATA:  76 year old female presenting for screening recall of a possible left breast mass. The patient states that she is on a DHEA and has been on this since her hysterectomy in her 5s. EXAM: DIGITAL DIAGNOSTIC LEFT MAMMOGRAM WITH 3D TOMOSYNTHESIS AND CAD LEFT BREAST ULTRASOUND COMPARISON:  Previous exam(s). ACR Breast Density Category c: The breast tissue is heterogeneously dense, which may obscure small masses. FINDINGS: In the lateral left breast, middle depth there is a circumscribed oval mass measuring approximately 2.3 cm mammographically. No other suspicious calcifications, masses or areas of distortion are seen in the left breast. Mammographic images were processed with CAD. Physical exam of the lateral left breast demonstrates a firm mobile palpable mass at the 3 o'clock position. Ultrasound targeted to the palpable  area at 3 o'clock demonstrates a circumscribed anechoic oval mass with increased posterior acoustic enhancement measuring 2.2 x 1.1 x 2.6 cm. This corresponds with the mammographic mass of concern. IMPRESSION: 1. The mass identified on mammography corresponds with a benign cyst in the left breast at 3 o'clock. 2. No mammographic or targeted sonographic evidence of left breast malignancy. RECOMMENDATION: Screening mammogram in one year.(Code:SM-B-01Y) I have discussed the findings and recommendations with the patient. Results were also provided in writing at the conclusion of the visit. If applicable, a reminder letter will be sent to the patient regarding the next appointment. BI-RADS CATEGORY  2: Benign. Electronically Signed   By: Ammie Ferrier M.D.   On: 01/07/2016 11:16   Mm Diag Breast Tomo Uni Left  Result Date: 01/07/2016 CLINICAL DATA:  76 year old female presenting for screening recall of a possible left breast mass. The patient states that she is on a DHEA and has been on this since her hysterectomy in her 22s. EXAM: DIGITAL DIAGNOSTIC LEFT MAMMOGRAM  WITH 3D TOMOSYNTHESIS AND CAD LEFT BREAST ULTRASOUND COMPARISON:  Previous exam(s). ACR Breast Density Category c: The breast tissue is heterogeneously dense, which may obscure small masses. FINDINGS: In the lateral left breast, middle depth there is a circumscribed oval mass measuring approximately 2.3 cm mammographically. No other suspicious calcifications, masses or areas of distortion are seen in the left breast. Mammographic images were processed with CAD. Physical exam of the lateral left breast demonstrates a firm mobile palpable mass at the 3 o'clock position. Ultrasound targeted to the palpable area at 3 o'clock demonstrates a circumscribed anechoic oval mass with increased posterior acoustic enhancement measuring 2.2 x 1.1 x 2.6 cm. This corresponds with the mammographic mass of concern. IMPRESSION: 1. The mass identified on mammography  corresponds with a benign cyst in the left breast at 3 o'clock. 2. No mammographic or targeted sonographic evidence of left breast malignancy. RECOMMENDATION: Screening mammogram in one year.(Code:SM-B-01Y) I have discussed the findings and recommendations with the patient. Results were also provided in writing at the conclusion of the visit. If applicable, a reminder letter will be sent to the patient regarding the next appointment. BI-RADS CATEGORY  2: Benign. Electronically Signed   By: Ammie Ferrier M.D.   On: 01/07/2016 11:16    Assessment & Plan:   Neda was seen today for hypothyroidism, gastroesophageal reflux and hyperlipidemia.  Diagnoses and all orders for this visit:  Acquired hypothyroidism-Her TSH is in the normal range.  She will remain on the current dose of levothyroxine. -     TSH; Future  Senile osteopenia- I will monitor her estrogen level at her request.  She is receiving an estrogen supplement from a naturopathic doctor.  -     Estradiol; Future  Hyperlipidemia with target LDL less than 130-she has a mildly elevated ASCVD risk score but is not willing to take a statin for CV risk reduction. -     Lipid panel; Future  Gastroesophageal reflux disease without esophagitis- Her symptoms are well controlled.  No alarm features noted.  Will continue the PPI. -     pantoprazole (PROTONIX) 40 MG tablet; Take 1 tablet (40 mg total) by mouth daily as needed (acid reflux). -     CBC with Differential/Platelet; Future  Panic anxiety syndrome -     ALPRAZolam (XANAX) 0.25 MG tablet; TAKE ONE TABLET BY MOUTH THREE TIMES DAILY AS NEEDED FOR SLEEP OR ANXIETY  Upper abdominal pain  Need for shingles vaccine -     Zoster Vaccine Adjuvanted [facility]) injection; Inject 0.5 mLs into the muscle once for 1 dose.   I have changed Zena Amos. [place]'s pantoprazole. I am also having her start on Zoster Vaccine Adjuvanted. Additionally, I am having her maintain her Vitamin B-12 CR, Fish  Oil, Vitamin D3, CALCIUM PO, aspirin, thyroid, nitroGLYCERIN, Nutritional Supplements (DHEA PO), carvedilol, and ALPRAZolam.  Meds ordered this encounter  Medications  . pantoprazole (PROTONIX) 40 MG tablet    Sig: Take 1 tablet (40 mg total) by mouth daily as needed (acid reflux).    Dispense:  90 tablet    Refill:  1  . ALPRAZolam (XANAX) 0.25 MG tablet    Sig: TAKE ONE TABLET BY MOUTH THREE TIMES DAILY AS NEEDED FOR SLEEP OR ANXIETY    Dispense:  65 tablet    Refill:  2  . Zoster Vaccine Adjuvanted [facility]) injection    Sig: Inject 0.5 mLs into the muscle once for 1 dose.    Dispense:  0.5 mL  Refill:  1     Follow-up: Return in about 6 months (around 05/09/2018).  Scarlette Calico, MD

## 2017-11-08 NOTE — Patient Instructions (Signed)
Heartburn Heartburn is a type of pain or discomfort that can happen in the throat or chest. It is often described as a burning pain. It may also cause a bad taste in the mouth. Heartburn may feel worse when you lie down or bend over, and it is often worse at night. Heartburn may be caused by stomach contents that move back up into the esophagus (reflux). Follow these instructions at home: Take these actions to decrease your discomfort and to help avoid complications. Diet  Follow a diet as recommended by your health care provider. This may involve avoiding foods and drinks such as: ? Coffee and tea (with or without caffeine). ? Drinks that contain alcohol. ? Energy drinks and sports drinks. ? Carbonated drinks or sodas. ? Chocolate and cocoa. ? Peppermint and mint flavorings. ? Garlic and onions. ? Horseradish. ? Spicy and acidic foods, including peppers, chili powder, curry powder, vinegar, hot sauces, and barbecue sauce. ? Citrus fruit juices and citrus fruits, such as oranges, lemons, and limes. ? Tomato-based foods, such as red sauce, chili, salsa, and pizza with red sauce. ? Fried and fatty foods, such as donuts, french fries, potato chips, and high-fat dressings. ? High-fat meats, such as hot dogs and fatty cuts of red and white meats, such as rib eye steak, sausage, ham, and bacon. ? High-fat dairy items, such as whole milk, butter, and cream cheese.  Eat small, frequent meals instead of large meals.  Avoid drinking large amounts of liquid with your meals.  Avoid eating meals during the 2-3 hours before bedtime.  Avoid lying down right after you eat.  Do not exercise right after you eat. General instructions  Pay attention to any changes in your symptoms.  Take over-the-counter and prescription medicines only as told by your health care provider. Do not take aspirin, ibuprofen, or other NSAIDs unless your health care provider told you to do so.  Do not use any tobacco  products, including cigarettes, chewing tobacco, and e-cigarettes. If you need help quitting, ask your health care provider.  Wear loose-fitting clothing. Do not wear anything tight around your waist that causes pressure on your abdomen.  Raise (elevate) the head of your bed about 6 inches (15 cm).  Try to reduce your stress, such as with yoga or meditation. If you need help reducing stress, ask your health care provider.  If you are overweight, reduce your weight to an amount that is healthy for you. Ask your health care provider for guidance about a safe weight loss goal.  Keep all follow-up visits as told by your health care provider. This is important. Contact a health care provider if:  You have new symptoms.  You have unexplained weight loss.  You have difficulty swallowing, or it hurts to swallow.  You have wheezing or a persistent cough.  Your symptoms do not improve with treatment.  You have frequent heartburn for more than two weeks. Get help right away if:  You have pain in your arms, neck, jaw, teeth, or back.  You feel sweaty, dizzy, or light-headed.  You have chest pain or shortness of breath.  You vomit and your vomit looks like blood or coffee grounds.  Your stool is bloody or black. This information is not intended to replace advice given to you by your health care provider. Make sure you discuss any questions you have with your health care provider. Document Released: 03/27/2009 Document Revised: 04/15/2016 Document Reviewed: 03/05/2015 Elsevier Interactive Patient Education  2017 [employer]   [employer].  

## 2017-11-09 ENCOUNTER — Telehealth: Payer: Self-pay | Admitting: Internal Medicine

## 2017-11-09 NOTE — Telephone Encounter (Signed)
Per chart MD sent refill on 11/03/17 to [facility].Marland KitchenJohny Chess

## 2017-11-09 NOTE — Telephone Encounter (Signed)
Copied from [place]. Topic: Quick Communication - Rx Refill/Question >> Nov 09, 2017  1:55 PM Oliver Pila B wrote: Has the patient contacted their pharmacy? Yes Pt states the Rx for carvedilol needs to be refilled, contact pharmacy or pt if needed

## 2017-11-12 ENCOUNTER — Encounter: Payer: Self-pay | Admitting: Internal Medicine

## 2017-11-14 NOTE — Telephone Encounter (Signed)
Per chart MD release on 11/11/17, and pt has reviewed the results...[name]

## 2017-11-17 DIAGNOSIS — Z1231 Encounter for screening mammogram for malignant neoplasm of breast: Secondary | ICD-10-CM | POA: Diagnosis not present

## 2017-11-17 DIAGNOSIS — Z803 Family history of malignant neoplasm of breast: Secondary | ICD-10-CM | POA: Diagnosis not present

## 2017-11-18 ENCOUNTER — Encounter: Payer: Self-pay | Admitting: Internal Medicine

## 2017-11-18 LAB — ESTRADIOL, FREE
ESTRADIOL: 17 pg/mL
Estradiol, Free: 0.2 pg/mL

## 2018-03-29 DIAGNOSIS — L719 Rosacea, unspecified: Secondary | ICD-10-CM | POA: Diagnosis not present

## 2018-03-29 DIAGNOSIS — D229 Melanocytic nevi, unspecified: Secondary | ICD-10-CM | POA: Diagnosis not present

## 2018-03-29 DIAGNOSIS — L570 Actinic keratosis: Secondary | ICD-10-CM | POA: Diagnosis not present

## 2018-04-19 ENCOUNTER — Telehealth: Payer: Self-pay | Admitting: Internal Medicine

## 2018-04-19 ENCOUNTER — Encounter: Payer: Self-pay | Admitting: Internal Medicine

## 2018-04-19 ENCOUNTER — Ambulatory Visit (INDEPENDENT_AMBULATORY_CARE_PROVIDER_SITE_OTHER): Payer: Medicare Other | Admitting: Internal Medicine

## 2018-04-19 ENCOUNTER — Other Ambulatory Visit (INDEPENDENT_AMBULATORY_CARE_PROVIDER_SITE_OTHER): Payer: Medicare Other

## 2018-04-19 VITALS — BP 140/80 | HR 69 | Temp 97.9°F | Resp 16 | Ht 60.0 in | Wt 125.0 lb

## 2018-04-19 DIAGNOSIS — E785 Hyperlipidemia, unspecified: Secondary | ICD-10-CM | POA: Diagnosis not present

## 2018-04-19 DIAGNOSIS — R9431 Abnormal electrocardiogram [ECG] [EKG]: Secondary | ICD-10-CM

## 2018-04-19 DIAGNOSIS — E039 Hypothyroidism, unspecified: Secondary | ICD-10-CM | POA: Diagnosis not present

## 2018-04-19 DIAGNOSIS — R000 Tachycardia, unspecified: Secondary | ICD-10-CM

## 2018-04-19 DIAGNOSIS — F410 Panic disorder [episodic paroxysmal anxiety] without agoraphobia: Secondary | ICD-10-CM

## 2018-04-19 LAB — BRAIN NATRIURETIC PEPTIDE: Pro B Natriuretic peptide (BNP): 35 pg/mL (ref 0.0–100.0)

## 2018-04-19 LAB — TROPONIN I: TNIDX: 0 ug/l (ref 0.00–0.06)

## 2018-04-19 LAB — TSH: TSH: 1.7 u[IU]/mL (ref 0.35–4.50)

## 2018-04-19 MED ORDER — CARVEDILOL 3.125 MG PO TABS: ORAL_TABLET | ORAL | 1 refills | Status: DC

## 2018-04-19 MED ORDER — ALPRAZOLAM 0.25 MG PO TABS: ORAL_TABLET | ORAL | 2 refills | Status: DC

## 2018-04-19 MED ORDER — THYROID 60 MG PO TABS: 60.0000 mg | ORAL_TABLET | Freq: Every day | ORAL | 1 refills | Status: DC

## 2018-04-19 NOTE — Progress Notes (Signed)
Subjective:  Patient ID: [name], female    DOB: Oct 16, 1941  Age: 77 y.o. MRN: [id number]  CC: Hypothyroidism   HPI [name] presents for f/up -she complains of a 4-day history of the discomfort on her left lower face and left neck.  She describes it as a tightness with no preceding trauma or injury.  The discomfort goes away after she takes a baby aspirin and a Xanax.  During this time she has been walking 3 miles a day and she does not experience CP, DOE, palpitations, edema, or fatigue.  Outpatient Medications Prior to Visit  Medication Sig Dispense Refill  . aspirin 81 MG tablet Take 162 mg by mouth daily.    Marland Kitchen CALCIUM PO Take 1-2 capsules by mouth at bedtime.     . Cholecalciferol (VITAMIN D3) 5000 UNITS TABS Take 1 tablet by mouth daily.     . Cyanocobalamin (VITAMIN B-12 CR) 1500 MCG TBCR Take 1 tablet by mouth 3 (three) times a week.     . nitroGLYCERIN (NITROSTAT) 0.4 MG SL tablet Place 1 tablet (0.4 mg total) under the tongue every 5 (five) minutes as needed. 25 tablet 3  . Nutritional Supplements (DHEA PO) Take 5 mg by mouth. 5 days per week    . Omega-3 Fatty Acids (FISH OIL) 1000 MG CAPS Take 2 capsules by mouth daily.     . pantoprazole (PROTONIX) 40 MG tablet Take 1 tablet (40 mg total) by mouth daily as needed (acid reflux). 90 tablet 1  . ALPRAZolam (XANAX) 0.25 MG tablet TAKE ONE TABLET BY MOUTH THREE TIMES DAILY AS NEEDED FOR SLEEP OR ANXIETY 65 tablet 2  . carvedilol (COREG) 3.125 MG tablet TAKE 1 TABLET TWICE DAILY WITH A MEAL 180 tablet 1  . thyroid (NP THYROID) 60 MG tablet Take 60 mg by mouth daily before breakfast.     No facility-administered medications prior to visit.     ROS Review of Systems  Constitutional: Negative.  Negative for chills, diaphoresis, fatigue and fever.  HENT: Negative.  Negative for facial swelling, nosebleeds, sinus pressure, sore throat, tinnitus, trouble swallowing and voice change.   Eyes: Negative.  Negative for  visual disturbance.  Respiratory: Negative.  Negative for cough, chest tightness, shortness of breath and wheezing.   Cardiovascular: Negative for chest pain, palpitations and leg swelling.  Gastrointestinal: Negative for abdominal pain, diarrhea, nausea and vomiting.  Endocrine: Negative.   Genitourinary: Negative.   Musculoskeletal: Positive for neck pain. Negative for back pain.  Skin: Negative.  Negative for color change, pallor and rash.  Allergic/Immunologic: Negative.   Neurological: Negative.  Negative for dizziness, seizures, weakness, light-headedness, numbness and headaches.  Hematological: Negative for adenopathy. Does not bruise/bleed easily.  Psychiatric/Behavioral: Negative for dysphoric mood, sleep disturbance and suicidal ideas. The patient is nervous/anxious.     Objective:  BP 140/80 (BP Location: Left Arm, Patient Position: Sitting, Cuff Size: Normal)   Pulse 69   Temp 97.9 F (36.6 C) (Oral)   Resp 16   Ht 5' (1.524 m)   Wt 125 lb (56.7 kg)   SpO2 98%   BMI 24.41 kg/m   BP Readings from Last 3 Encounters:  04/19/18 140/80  11/08/17 128/78  06/30/17 128/82    Wt Readings from Last 3 Encounters:  04/19/18 125 lb (56.7 kg)  11/08/17 124 lb (56.2 kg)  06/30/17 124 lb (56.2 kg)    Physical Exam  Constitutional: She is oriented to person, place, and time. No distress.  HENT:  Mouth/Throat: Oropharynx is clear and moist. No oropharyngeal exudate.  Eyes: Conjunctivae are normal. No scleral icterus.  Neck: Trachea normal, normal range of motion and phonation normal. Neck supple. No JVD present. No tracheal tenderness, no spinous process tenderness and no muscular tenderness present. Carotid bruit is not present. No neck rigidity. No tracheal deviation, no edema, no erythema and normal range of motion present. No Brudzinski's sign and no Kernig's sign noted. No thyroid mass and no thyromegaly present.  Cardiovascular: Normal rate and regular rhythm. Exam reveals  no friction rub.  No murmur heard. EKG ---  Sinus  Rhythm  -Old anteroseptal infarct.   ABNORMAL - there a new changes noted in III that are isolated just to the III lead  Pulmonary/Chest: Effort normal and breath sounds normal. No respiratory distress. She has no wheezes. She has no rales. She exhibits no tenderness.  Abdominal: Soft. Bowel sounds are normal. She exhibits no mass. There is no hepatosplenomegaly. There is no tenderness.  Musculoskeletal: Normal range of motion. She exhibits no edema, tenderness or deformity.  Lymphadenopathy:    She has no cervical adenopathy.  Neurological: She is alert and oriented to person, place, and time.  Skin: Skin is warm and dry. She is not diaphoretic. No erythema. No pallor.  Vitals reviewed.   Lab Results  Component Value Date   WBC 5.0 11/08/2017   HGB 14.1 11/08/2017   HCT 41.7 11/08/2017   PLT 215.0 11/08/2017   GLUCOSE 105 (H) 03/16/2017   CHOL 183 11/08/2017   TRIG 53.0 11/08/2017   HDL 58.00 11/08/2017   LDLDIRECT 130.0 08/03/2011   LDLCALC 115 (H) 11/08/2017   ALT 11 03/16/2017   AST 14 03/16/2017   NA 136 03/16/2017   K 4.2 03/16/2017   CL 106 03/16/2017   CREATININE 0.85 03/16/2017   BUN 11 03/16/2017   CO2 24 03/16/2017   TSH 1.70 04/19/2018   HGBA1C 5.3 07/29/2015    US Breast Ltd Uni Left Inc Axilla  Result Date: 01/07/2016 CLINICAL DATA:  77 year old female presenting for screening recall of a possible left breast mass. The patient states that she is on a DHEA and has been on this since her hysterectomy in her 59s. EXAM: DIGITAL DIAGNOSTIC LEFT MAMMOGRAM WITH 3D TOMOSYNTHESIS AND CAD LEFT BREAST ULTRASOUND COMPARISON:  Previous exam(s). ACR Breast Density Category c: The breast tissue is heterogeneously dense, which may obscure small masses. FINDINGS: In the lateral left breast, middle depth there is a circumscribed oval mass measuring approximately 2.3 cm mammographically. No other suspicious calcifications,  masses or areas of distortion are seen in the left breast. Mammographic images were processed with CAD. Physical exam of the lateral left breast demonstrates a firm mobile palpable mass at the 3 o'clock position. Ultrasound targeted to the palpable area at 3 o'clock demonstrates a circumscribed anechoic oval mass with increased posterior acoustic enhancement measuring 2.2 x 1.1 x 2.6 cm. This corresponds with the mammographic mass of concern. IMPRESSION: 1. The mass identified on mammography corresponds with a benign cyst in the left breast at 3 o'clock. 2. No mammographic or targeted sonographic evidence of left breast malignancy. RECOMMENDATION: Screening mammogram in one year.(Code:SM-B-01Y) I have discussed the findings and recommendations with the patient. Results were also provided in writing at the conclusion of the visit. If applicable, a reminder letter will be sent to the patient regarding the next appointment. BI-RADS CATEGORY  2: Benign. Electronically Signed   By: Ammie Ferrier M.D.   On:  01/07/2016 11:16   Mm Diag Breast Tomo Uni Left  Result Date: 01/07/2016 CLINICAL DATA:  77 year old female presenting for screening recall of a possible left breast mass. The patient states that she is on a DHEA and has been on this since her hysterectomy in her 42s. EXAM: DIGITAL DIAGNOSTIC LEFT MAMMOGRAM WITH 3D TOMOSYNTHESIS AND CAD LEFT BREAST ULTRASOUND COMPARISON:  Previous exam(s). ACR Breast Density Category c: The breast tissue is heterogeneously dense, which may obscure small masses. FINDINGS: In the lateral left breast, middle depth there is a circumscribed oval mass measuring approximately 2.3 cm mammographically. No other suspicious calcifications, masses or areas of distortion are seen in the left breast. Mammographic images were processed with CAD. Physical exam of the lateral left breast demonstrates a firm mobile palpable mass at the 3 o'clock position. Ultrasound targeted to the palpable area  at 3 o'clock demonstrates a circumscribed anechoic oval mass with increased posterior acoustic enhancement measuring 2.2 x 1.1 x 2.6 cm. This corresponds with the mammographic mass of concern. IMPRESSION: 1. The mass identified on mammography corresponds with a benign cyst in the left breast at 3 o'clock. 2. No mammographic or targeted sonographic evidence of left breast malignancy. RECOMMENDATION: Screening mammogram in one year.(Code:SM-B-01Y) I have discussed the findings and recommendations with the patient. Results were also provided in writing at the conclusion of the visit. If applicable, a reminder letter will be sent to the patient regarding the next appointment. BI-RADS CATEGORY  2: Benign. Electronically Signed   By: Ammie Ferrier M.D.   On: 01/07/2016 11:16    Assessment & Plan:   [name] was seen today for hypothyroidism.  Diagnoses and all orders for this visit:  Acquired hypothyroidism- Her TSH is in the normal range.  She will remain on the current thyroid replacement therapy. -     TSH; Future -     thyroid (NP THYROID) 60 MG tablet; Take 1 tablet (60 mg total) by mouth daily before breakfast.  Tachycardia- She is asymptomatic and her heart rate is well controlled.  Will continue carvedilol at the current dose. -     EKG 12-Lead -     carvedilol (COREG) 3.125 MG tablet; TAKE 1 TABLET TWICE DAILY WITH A MEAL  Abnormal electrocardiogram (ECG) (EKG)- She walks 3 miles without experiencing any symptoms.  She has an isolated change in lead III.  She underwent a Lexiscan about a year and a half ago that was normal.  Her BNP and troponin are both normal.  I do not think her neck pain is an angina equivalent.  She will let me know if she develops any new or worsening symptoms. -     Troponin I; Future -     Brain natriuretic peptide; Future  Panic anxiety syndrome -     ALPRAZolam (XANAX) 0.25 MG tablet; TAKE ONE TABLET BY MOUTH THREE TIMES DAILY AS NEEDED FOR SLEEP OR ANXIETY   I  have changed [name]'s thyroid. I am also having her maintain her Vitamin B-12 CR, Fish Oil, Vitamin D3, CALCIUM PO, aspirin, nitroGLYCERIN, Nutritional Supplements (DHEA PO), pantoprazole, ALPRAZolam, and carvedilol.  Meds ordered this encounter  Medications  . thyroid (NP THYROID) 60 MG tablet    Sig: Take 1 tablet (60 mg total) by mouth daily before breakfast.    Dispense:  90 tablet    Refill:  1  . ALPRAZolam (XANAX) 0.25 MG tablet    Sig: TAKE ONE TABLET BY MOUTH THREE TIMES DAILY AS NEEDED  FOR SLEEP OR ANXIETY    Dispense:  65 tablet    Refill:  2  . carvedilol (COREG) 3.125 MG tablet    Sig: TAKE 1 TABLET TWICE DAILY WITH A MEAL    Dispense:  180 tablet    Refill:  1     Follow-up: No follow-ups on file.  Scarlette Calico, MD

## 2018-04-19 NOTE — Telephone Encounter (Signed)
Copied from [place] [id number]. Topic: Quick Communication - See Telephone Encounter >> Apr 19, 2018  1:15 PM Bea Graff, NT wrote: CRM for notification. See Telephone encounter for: 04/19/18. Pt would like a call with her lab results.

## 2018-04-20 ENCOUNTER — Other Ambulatory Visit: Payer: Self-pay | Admitting: Internal Medicine

## 2018-04-20 ENCOUNTER — Other Ambulatory Visit (INDEPENDENT_AMBULATORY_CARE_PROVIDER_SITE_OTHER): Payer: Medicare Other

## 2018-04-20 ENCOUNTER — Encounter: Payer: Self-pay | Admitting: Internal Medicine

## 2018-04-20 DIAGNOSIS — E785 Hyperlipidemia, unspecified: Secondary | ICD-10-CM | POA: Diagnosis not present

## 2018-04-20 DIAGNOSIS — R202 Paresthesia of skin: Secondary | ICD-10-CM

## 2018-04-20 DIAGNOSIS — R209 Unspecified disturbances of skin sensation: Principal | ICD-10-CM

## 2018-04-20 LAB — LIPID PANEL
CHOLESTEROL: 233 mg/dL — AB (ref 0–200)
HDL: 78.9 mg/dL (ref >39.00)
LDL Cholesterol: 145 mg/dL — ABNORMAL HIGH (ref 0–99)
NonHDL: 153.8
Total CHOL/HDL Ratio: 3
Triglycerides: 45 mg/dL (ref 0.0–149.0)
VLDL: 9 mg/dL (ref 0.0–40.0)

## 2018-04-21 ENCOUNTER — Other Ambulatory Visit: Payer: Self-pay | Admitting: Internal Medicine

## 2018-04-21 DIAGNOSIS — E785 Hyperlipidemia, unspecified: Secondary | ICD-10-CM

## 2018-04-21 MED ORDER — PITAVASTATIN CALCIUM 2 MG PO TABS: 1.0000 | ORAL_TABLET | Freq: Every day | ORAL | 1 refills | Status: DC

## 2018-04-23 ENCOUNTER — Ambulatory Visit
Admission: RE | Admit: 2018-04-23 | Discharge: 2018-04-23 | Disposition: A | Discharge status: Home / Self Care | Payer: Medicare Other | Source: Ambulatory Visit | Attending: Internal Medicine | Admitting: Internal Medicine

## 2018-04-23 ENCOUNTER — Other Ambulatory Visit: Payer: Medicare Other

## 2018-04-23 DIAGNOSIS — R202 Paresthesia of skin: Secondary | ICD-10-CM

## 2018-04-23 DIAGNOSIS — R209 Unspecified disturbances of skin sensation: Principal | ICD-10-CM

## 2018-04-23 MED ORDER — GADOBENATE DIMEGLUMINE 529 MG/ML IV SOLN
10.0000 mL | Freq: Once | INTRAVENOUS | Status: AC | PRN
  Administered 2018-04-23: 10 mL via INTRAVENOUS

## 2018-04-24 ENCOUNTER — Other Ambulatory Visit: Payer: Self-pay | Admitting: Internal Medicine

## 2018-04-24 ENCOUNTER — Encounter: Payer: Self-pay | Admitting: Internal Medicine

## 2018-05-09 ENCOUNTER — Telehealth: Payer: Self-pay | Admitting: Emergency Medicine

## 2018-05-09 NOTE — Telephone Encounter (Signed)
Called patient to schedule AWV. Pt stated she will call back to schedule when she gets back in the country.

## 2018-07-10 ENCOUNTER — Telehealth: Payer: Self-pay | Admitting: Physician Assistant

## 2018-07-10 DIAGNOSIS — H43813 Vitreous degeneration, bilateral: Secondary | ICD-10-CM | POA: Diagnosis not present

## 2018-07-10 DIAGNOSIS — H0012 Chalazion right lower eyelid: Secondary | ICD-10-CM | POA: Diagnosis not present

## 2018-07-10 DIAGNOSIS — H2513 Age-related nuclear cataract, bilateral: Secondary | ICD-10-CM | POA: Diagnosis not present

## 2018-07-10 NOTE — Telephone Encounter (Signed)
Pt calling stating she had chest pain 07/08/18. This call should had been routed to Triage for further evaluation by Triage nurse. I will route to Triage.

## 2018-07-10 NOTE — Telephone Encounter (Signed)
Spoke with patient, her chest pressure event was on Saturday night, she took aspirin and a xanax and went to bed. The patient denies any other symptoms during the event and is currently asymptomatic. This was the only event of chest pressure. The patient has a history of GERD chest discomfort in the past. The event occurred after a day of yard work. She requested an appointment with Richardson Dopp, PA. She is leaving the country on 9/16-9/30 and was interested in getting an EKG. The appointment was scheduled for 10/2 at 10:15 due to no free appointments. Sending to [facility] for scheduling recommendations.

## 2018-07-10 NOTE — Telephone Encounter (Signed)
New message    Pt c/o of Chest Pain: STAT if CP now or developed within 24 hours  1. Are you having CP right now?no   2. Are you experiencing any other symptoms (ex. SOB, nausea, vomiting, sweating)? No   3. How long have you been experiencing CP? Patient states that she had chest pain on 07/08/2018.   4. Is your CP continuous or coming and going? Coming and going  5. Have you taken Nitroglycerin? No  ?

## 2018-07-12 ENCOUNTER — Other Ambulatory Visit: Payer: Self-pay | Admitting: Dermatology

## 2018-07-12 DIAGNOSIS — D229 Melanocytic nevi, unspecified: Secondary | ICD-10-CM | POA: Diagnosis not present

## 2018-07-12 DIAGNOSIS — L570 Actinic keratosis: Secondary | ICD-10-CM | POA: Diagnosis not present

## 2018-07-12 DIAGNOSIS — C44619 Basal cell carcinoma of skin of left upper limb, including shoulder: Secondary | ICD-10-CM | POA: Diagnosis not present

## 2018-07-12 NOTE — Telephone Encounter (Signed)
See phone note on 8/19 from [place], [place]. I s/w pt today and she has been scheduled to see Brynda Rim. PA on  9/13 @ 11:15 am, dx chest discomfort. Pt aware that we did leave the 10/2 appt just in case PA wants to see her back then as well. Pt thanked me for the call.

## 2018-07-13 ENCOUNTER — Encounter: Payer: Self-pay | Admitting: Physician Assistant

## 2018-08-04 ENCOUNTER — Ambulatory Visit: Payer: Medicare Other | Admitting: Physician Assistant

## 2018-08-04 ENCOUNTER — Encounter: Payer: Self-pay | Admitting: Physician Assistant

## 2018-08-04 VITALS — BP 118/80 | HR 73 | Ht 60.0 in | Wt 122.1 lb

## 2018-08-04 DIAGNOSIS — R0789 Other chest pain: Secondary | ICD-10-CM | POA: Diagnosis not present

## 2018-08-04 DIAGNOSIS — K219 Gastro-esophageal reflux disease without esophagitis: Secondary | ICD-10-CM | POA: Diagnosis not present

## 2018-08-04 DIAGNOSIS — E785 Hyperlipidemia, unspecified: Secondary | ICD-10-CM | POA: Diagnosis not present

## 2018-08-04 NOTE — Patient Instructions (Signed)
Medication Instructions: Please try and take your Protonix 30 minutes before dinner for 2 weeks   Labwork: None  Procedures/Testing: Your physician has requested that you have an exercise stress myoview. For further information please visit [email or web address]. Please follow instruction sheet, as given.    Follow-Up: Your physician wants you to follow-up in: 6 months with Nicki Reaper PA-C or Dr. Theressa Stamps will receive a reminder letter in the mail two months in advance. If you don't receive a letter, please call our office to schedule the follow-up appointment.   Any Additional Special Instructions Will Be Listed Below (If Applicable).     If you need a refill on your cardiac medications before your next appointment, please call your pharmacy.

## 2018-08-04 NOTE — Progress Notes (Signed)
Cardiology Office Note:    Date:  08/04/2018   ID:  [name], Bold 04/02/1941, MRN [id number]  PCP:  Janith Lima, MD  Cardiologist:  Dorris Carnes, MD  Electrophysiologist:  None   Referring MD: Janith Lima, MD   Chief Complaint  Patient presents with  . Chest Pain     History of Present Illness:    [name] is a 77 y.o. female with GERD, esophageal stricture status post dilatation in 2011, hypothyroidism and prior TIA. Nuclear stress test in 11/17 demonstrated no ischemia.     [name] returns for evaluation chest pain.  Over the last few weeks she has occasional L chest heaviness.  It typically occurs in the morning.  She has some radiation to her L scapula.  It is not associated with exertion.  She does get short of breath if she walks up an incline.  She does exercise on a regular basis without limitation.  She has not had orthopnea, paroxysmal nocturnal dyspnea, leg swelling.  Her chest pain usually lasts several hours.  She denies pleuritic symptoms or chest pain while lying flat.  She is leaving for [place] (will be gone 10 days) next week.  She just returned from [place] (in the [place]).  Her granddaughter is in [employer] there.    Prior CV studies:   The following studies were reviewed today:  Head/Neck MRI/MRA 04/23/18 IMPRESSION: 1. No acute intracranial abnormality. 2. 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 3. Negative head MRA. 4. Negative neck MRA.  Nuclear stress test 10/21/16 Normal exercise nuclear stress test with no evidence of prior infarct or ischemia. Normal exercise capacity. Normal BP response to stress.  EF 92  Echo 11/08/14 EF 55-60, no RWMA, normal diastolic function, MAC  Event Monitor 12/15 NSR, no arrhythmias  Nuclear (04/2014):  No ischemia, EF 85%, normal study  Nuclear (04/2011): No ischemia, EF 101%   Carotid US (08/2013):  Normal carotids bilat.  ABI (03/2013):    Normal   Past Medical History:  Diagnosis Date  . CHEST PAIN 04/30/2009  . COMMON MIGRAINE 04/30/2009  . CONSTIPATION 04/30/2009  . ESOPHAGEAL STRICTURE 04/30/2009  . GERD 04/30/2009  . HIATAL HERNIA 04/30/2009  . Hx of cardiovascular stress test    ETT-Myoview (04/2014):  No ischemia, EF 85%; Normal // Myoview 11/17: EF 92, no scar or ischemia, Low Risk  . Hx of echocardiogram    Echo (12/15):  EF 55-60%, no RWMA, normal diast function, MAC, LA 33 mm  . Hyperlipidemia   . HYPOTHYROIDISM 04/30/2009  . Irritable bowel syndrome 04/30/2009  . RAYNAUD'S DISEASE 04/30/2009   Surgical Hx: The patient  has a past surgical history that includes Abdominal hysterectomy (1976); Tonsillectomy (1990); Ectopic pregnancy surgery (1970); Rhinoplasty (1980); foot surgury (1997); Colonoscopy; and Upper gastrointestinal endoscopy.   Current Medications: Current Meds  Medication Sig  . ALPRAZolam (XANAX) 0.25 MG tablet TAKE ONE TABLET BY MOUTH THREE TIMES DAILY AS NEEDED FOR SLEEP OR ANXIETY  . aspirin 81 MG tablet Take 81 mg by mouth daily.   Marland Kitchen CALCIUM PO Take 1-2 capsules by mouth at bedtime.   . carvedilol (COREG) 3.125 MG tablet TAKE 1 TABLET TWICE DAILY WITH A MEAL  . Cholecalciferol (VITAMIN D3) 5000 UNITS TABS Take 1 tablet by mouth daily.   . Cyanocobalamin (VITAMIN B-12 CR) 1500 MCG TBCR Take 1 tablet by mouth 3 (three) times a week.   . nitroGLYCERIN (NITROSTAT) 0.4 MG  SL tablet Place 1 tablet (0.4 mg total) under the tongue every 5 (five) minutes as needed.  . Nutritional Supplements (DHEA PO) Take 5 mg by mouth. 5 days per week  . Omega-3 Fatty Acids (FISH OIL) 1000 MG CAPS Take 2 capsules by mouth daily.   . pantoprazole (PROTONIX) 40 MG tablet Take 1 tablet (40 mg total) by mouth daily as needed (acid reflux).  . Pitavastatin Calcium (LIVALO) 2 MG TABS Take 1 tablet (2 mg total) by mouth daily.  Marland Kitchen thyroid (NP THYROID) 60 MG tablet Take 1 tablet (60 mg total) by mouth daily before breakfast.      Allergies:   Patient has no known allergies.   Social History   Tobacco Use  . Smoking status: Former Smoker    Last attempt to quit: 10/27/1984    Years since quitting: 33.7  . Smokeless tobacco: Never Used  . Tobacco comment: quit 1985  Substance Use Topics  . Alcohol use: Yes    Alcohol/week: 0.0 standard drinks    Comment: 1-2 per week  . Drug use: No     Family Hx: The patient's family history includes Cancer in her paternal aunt and sister; Diabetes in her maternal aunt, maternal grandmother, and maternal uncle; Heart attack in her father; Heart disease in her father, paternal grandfather, and paternal grandmother; Migraines in her daughter; Stroke in her cousin. There is no history of Colon cancer.  ROS:   Please see the history of present illness.    Review of Systems  Cardiovascular: Positive for chest pain.  Hematologic/Lymphatic: Bruises/bleeds easily.   All other systems reviewed and are negative.   EKGs/Labs/Other Test Reviewed:    EKG:  EKG is  ordered today.  The ekg ordered today demonstrates normal sinus rhythm, HR 73, arm lead reversal, no acute ST-TW changes, QTc 431  Recent Labs: 11/08/2017: Hemoglobin 14.1; Platelets 215.0 04/19/2018: Pro B Natriuretic peptide (BNP) 35.0; TSH 1.70   Recent Lipid Panel Lab Results  Component Value Date/Time   CHOL 233 (H) 04/20/2018 08:59 AM   TRIG 45.0 04/20/2018 08:59 AM   HDL 78.90 04/20/2018 08:59 AM   CHOLHDL 3 04/20/2018 08:59 AM   LDLCALC 145 (H) 04/20/2018 08:59 AM   LDLDIRECT 130.0 08/03/2011 08:51 AM    Physical Exam:    VS:  BP 118/80   Pulse 73   Ht 5' (1.524 m)   Wt 122 lb 1.9 oz (55.4 kg)   SpO2 98%   BMI 23.85 kg/m     Wt Readings from Last 3 Encounters:  08/04/18 122 lb 1.9 oz (55.4 kg)  04/19/18 125 lb (56.7 kg)  11/08/17 124 lb (56.2 kg)     Physical Exam  Constitutional: She is oriented to person, place, and time. She appears well-developed and well-nourished. No distress.   HENT:  Head: Normocephalic and atraumatic.  Eyes: No scleral icterus.  Neck: No JVD present. No thyromegaly present.  Cardiovascular: Normal rate and regular rhythm.  No murmur heard. Pulmonary/Chest: Effort normal. She has no wheezes. She has no rales. She exhibits no tenderness.  Abdominal: Soft. She exhibits no distension.  Musculoskeletal: She exhibits no edema.  Lymphadenopathy:    She has no cervical adenopathy.  Neurological: She is alert and oriented to person, place, and time.  Skin: Skin is warm and dry.  Psychiatric: She has a normal mood and affect.    ASSESSMENT & PLAN:    Other chest pain Her symptoms are not typical for angina.  She  has not had exertional symptoms.  She notes some shortness of breath with going up an incline. Her chest pain does not limit her activity.  It usually lasts several hours.  She has a hx of GERD and her symptoms usually occur in the morning.  There are no acute changes on her ECG.  She has some risk factors for CAD.  She is leaving for [place] next week.  -Obtain Exercise Myoview to rule out ischemic heart disease prior to leaving for [place]  -Take Protonix daily for 2 weeks  Gastroesophageal reflux disease without esophagitis If her Myoview is low risk and her symptoms improve on proton pump inhibitor therapy, she may need evaluation by Gastroenterology.  Hyperlipidemia Continue statin.  This is managed by primary care.   Dispo:  Return in about 6 months (around 02/02/2019) for w/ Dr. Harrington Challenger, or Richardson Dopp, PA-C.   Medication Adjustments/Labs and Tests Ordered: Current medicines are reviewed at length with the patient today.  Concerns regarding medicines are outlined above.  Tests Ordered: Orders Placed This Encounter  Procedures  . MYOCARDIAL PERFUSION IMAGING  . EKG 12-Lead   Medication Changes: No orders of the defined types were placed in this encounter.   Signed, Richardson Dopp, PA-C  08/04/2018 11:56 AM    [facility] [address], [place], [place]  [address] Phone: [phone]; Fax: [phone]

## 2018-08-07 ENCOUNTER — Telehealth (HOSPITAL_COMMUNITY): Payer: Self-pay | Admitting: *Deleted

## 2018-08-07 NOTE — Telephone Encounter (Signed)
Patient given detailed instructions per Myocardial Perfusion Study Information Sheet for the test on 08/09/18 at 0945. Patient notified to arrive 15 minutes early and that it is imperative to arrive on time for appointment to keep from having the test rescheduled.  If you need to cancel or reschedule your appointment, please call the office within 24 hours of your appointment. . Patient verbalized understanding.[name], Ranae Palms

## 2018-08-09 ENCOUNTER — Encounter: Payer: Self-pay | Admitting: Physician Assistant

## 2018-08-09 ENCOUNTER — Ambulatory Visit (HOSPITAL_COMMUNITY): Payer: Medicare Other | Attending: Cardiology

## 2018-08-09 DIAGNOSIS — R0789 Other chest pain: Secondary | ICD-10-CM | POA: Insufficient documentation

## 2018-08-09 LAB — MYOCARDIAL PERFUSION IMAGING
CHL RATE OF PERCEIVED EXERTION: 17
CSEPEDS: 5 s
Estimated workload: 7.1 METS
Exercise duration (min): 6 min
LVDIAVOL: 37 mL (ref 46–106)
LVSYSVOL: 6 mL
MPHR: 144 {beats}/min
NUC STRESS TID: 0.9
Peak HR: 129 {beats}/min
Percent HR: 89 %
Rest HR: 63 {beats}/min
SDS: 0
SRS: 0
SSS: 0

## 2018-08-09 MED ORDER — TECHNETIUM TC 99M TETROFOSMIN IV KIT
9.2000 | PACK | Freq: Once | INTRAVENOUS | Status: AC | PRN
  Administered 2018-08-09: 9.2 via INTRAVENOUS
  Filled 2018-08-09: qty 10

## 2018-08-09 MED ORDER — TECHNETIUM TC 99M TETROFOSMIN IV KIT
30.6000 | PACK | Freq: Once | INTRAVENOUS | Status: AC | PRN
  Administered 2018-08-09: 30.6 via INTRAVENOUS
  Filled 2018-08-09: qty 31

## 2018-08-23 ENCOUNTER — Ambulatory Visit: Payer: Medicare Other | Admitting: Physician Assistant

## 2018-08-24 DIAGNOSIS — L570 Actinic keratosis: Secondary | ICD-10-CM | POA: Diagnosis not present

## 2018-08-24 DIAGNOSIS — C44519 Basal cell carcinoma of skin of other part of trunk: Secondary | ICD-10-CM | POA: Diagnosis not present

## 2018-09-01 ENCOUNTER — Telehealth: Payer: Self-pay | Admitting: Internal Medicine

## 2018-09-01 NOTE — Telephone Encounter (Signed)
Pt has an appt coming up on 09/14/2018 and will discuss at that appt.

## 2018-09-01 NOTE — Telephone Encounter (Signed)
Copied from [place] [id number]. Topic: Quick Communication - See Telephone Encounter >> Sep 01, 2018  9:58 AM Bea Graff, NT wrote: CRM for notification. See Telephone encounter for: 09/01/18. [name], a nurse with [facility] states when she went to see the pt on Wednesday, they did a quanta flo test on her feet for peripheral arterial disease, the left was fine 1.06, but the right 0.80. Pt had no symptoms, and pt is on low dose aspirin. Please follow-up with pt. CB#: [phone]

## 2018-09-14 ENCOUNTER — Encounter: Payer: Self-pay | Admitting: Internal Medicine

## 2018-09-14 ENCOUNTER — Ambulatory Visit (INDEPENDENT_AMBULATORY_CARE_PROVIDER_SITE_OTHER): Payer: Medicare Other | Admitting: Internal Medicine

## 2018-09-14 ENCOUNTER — Other Ambulatory Visit (INDEPENDENT_AMBULATORY_CARE_PROVIDER_SITE_OTHER): Payer: Medicare Other

## 2018-09-14 VITALS — BP 120/76 | HR 65 | Temp 97.9°F | Resp 16 | Ht 60.0 in | Wt 124.0 lb

## 2018-09-14 DIAGNOSIS — E785 Hyperlipidemia, unspecified: Secondary | ICD-10-CM | POA: Diagnosis not present

## 2018-09-14 DIAGNOSIS — K219 Gastro-esophageal reflux disease without esophagitis: Secondary | ICD-10-CM | POA: Diagnosis not present

## 2018-09-14 DIAGNOSIS — E039 Hypothyroidism, unspecified: Secondary | ICD-10-CM | POA: Diagnosis not present

## 2018-09-14 DIAGNOSIS — N951 Menopausal and female climacteric states: Secondary | ICD-10-CM | POA: Insufficient documentation

## 2018-09-14 DIAGNOSIS — R000 Tachycardia, unspecified: Secondary | ICD-10-CM

## 2018-09-14 LAB — LIPID PANEL
CHOL/HDL RATIO: 2
Cholesterol: 132 mg/dL (ref 0–200)
HDL: 54.8 mg/dL (ref >39.00)
LDL CALC: 64 mg/dL (ref 0–99)
NonHDL: 77.02
Triglycerides: 66 mg/dL (ref 0.0–149.0)
VLDL: 13.2 mg/dL (ref 0.0–40.0)

## 2018-09-14 LAB — TSH: TSH: 0.54 u[IU]/mL (ref 0.35–4.50)

## 2018-09-14 MED ORDER — PANTOPRAZOLE SODIUM 40 MG PO TBEC: 40.0000 mg | DELAYED_RELEASE_TABLET | Freq: Every day | ORAL | 1 refills | Status: DC | PRN

## 2018-09-14 MED ORDER — CARVEDILOL 3.125 MG PO TABS: ORAL_TABLET | ORAL | 1 refills | Status: DC

## 2018-09-14 MED ORDER — PITAVASTATIN CALCIUM 2 MG PO TABS: 1.0000 | ORAL_TABLET | Freq: Every day | ORAL | 1 refills | Status: DC

## 2018-09-14 MED ORDER — THYROID 60 MG PO TABS: 60.0000 mg | ORAL_TABLET | Freq: Every day | ORAL | 1 refills | Status: DC

## 2018-09-14 NOTE — Patient Instructions (Signed)
Hypothyroidism Hypothyroidism is a disorder of the thyroid. The thyroid is a large gland that is located in the lower front of the neck. The thyroid releases hormones that control how the body works. With hypothyroidism, the thyroid does not make enough of these hormones. What are the causes? Causes of hypothyroidism may include:  Viral infections.  Pregnancy.  Your own defense system (immune system) attacking your thyroid.  Certain medicines.  Birth defects.  Past radiation treatments to your head or neck.  Past treatment with radioactive iodine.  Past surgical removal of part or all of your thyroid.  Problems with the gland that is located in the center of your brain (pituitary).  What are the signs or symptoms? Signs and symptoms of hypothyroidism may include:  Feeling as though you have no energy (lethargy).  Inability to tolerate cold.  Weight gain that is not explained by a change in diet or exercise habits.  Dry skin.  Coarse hair.  Menstrual irregularity.  Slowing of thought processes.  Constipation.  Sadness or depression.  How is this diagnosed? Your health care provider may diagnose hypothyroidism with blood tests and ultrasound tests. How is this treated? Hypothyroidism is treated with medicine that replaces the hormones that your body does not make. After you begin treatment, it may take several weeks for symptoms to go away. Follow these instructions at home:  Take medicines only as directed by your health care provider.  If you start taking any new medicines, tell your health care provider.  Keep all follow-up visits as directed by your health care provider. This is important. As your condition improves, your dosage needs may change. You will need to have blood tests regularly so that your health care provider can watch your condition. Contact a health care provider if:  Your symptoms do not get better with treatment.  You are taking thyroid  replacement medicine and: ? You sweat excessively. ? You have tremors. ? You feel anxious. ? You lose weight rapidly. ? You cannot tolerate heat. ? You have emotional swings. ? You have diarrhea. ? You feel weak. Get help right away if:  You develop chest pain.  You develop an irregular heartbeat.  You develop a rapid heartbeat. This information is not intended to replace advice given to you by your health care provider. Make sure you discuss any questions you have with your health care provider. Document Released: 11/08/2005 Document Revised: 04/15/2016 Document Reviewed: 03/26/2014 Elsevier Interactive Patient Education  2018 [employer] Inc[employer]  

## 2018-09-14 NOTE — Progress Notes (Signed)
Subjective:  Patient ID: [name], female    DOB: 02-Apr-1941  Age: 77 y.o. MRN: [id number]  CC: Hypothyroidism and Hyperlipidemia   HPI [name] presents for f/up - She complains of hot flashes, emotional lability, and insomnia.  She has been taking a compounded substance with estrogen and testosterone (Triest) but has run out recently.  She needs a refill.  Outpatient Medications Prior to Visit  Medication Sig Dispense Refill  . ALPRAZolam (XANAX) 0.25 MG tablet TAKE ONE TABLET BY MOUTH THREE TIMES DAILY AS NEEDED FOR SLEEP OR ANXIETY 65 tablet 2  . aspirin 81 MG tablet Take 81 mg by mouth daily.     Marland Kitchen CALCIUM PO Take 1-2 capsules by mouth at bedtime.     . Cholecalciferol (VITAMIN D3) 5000 UNITS TABS Take 1 tablet by mouth daily.     . Cyanocobalamin (VITAMIN B-12 CR) 1500 MCG TBCR Take 1 tablet by mouth 3 (three) times a week.     . nitroGLYCERIN (NITROSTAT) 0.4 MG SL tablet Place 1 tablet (0.4 mg total) under the tongue every 5 (five) minutes as needed. 25 tablet 3  . Nutritional Supplements (DHEA PO) Take 5 mg by mouth. 5 days per week    . Omega-3 Fatty Acids (FISH OIL) 1000 MG CAPS Take 2 capsules by mouth daily.     . carvedilol (COREG) 3.125 MG tablet TAKE 1 TABLET TWICE DAILY WITH A MEAL 180 tablet 1  . pantoprazole (PROTONIX) 40 MG tablet Take 1 tablet (40 mg total) by mouth daily as needed (acid reflux). 90 tablet 1  . Pitavastatin Calcium (LIVALO) 2 MG TABS Take 1 tablet (2 mg total) by mouth daily. 90 tablet 1  . thyroid (NP THYROID) 60 MG tablet Take 1 tablet (60 mg total) by mouth daily before breakfast. 90 tablet 1   No facility-administered medications prior to visit.     ROS Review of Systems  Constitutional: Negative for diaphoresis, fatigue and unexpected weight change.  HENT: Negative.   Eyes: Negative for visual disturbance.  Respiratory: Negative for cough, chest tightness and shortness of breath.   Cardiovascular: Negative.  Negative for  chest pain, palpitations and leg swelling.  Gastrointestinal: Negative for abdominal pain, constipation, diarrhea, nausea and vomiting.  Endocrine: Negative.  Negative for cold intolerance and heat intolerance.  Genitourinary: Negative.  Negative for difficulty urinating.  Musculoskeletal: Negative.   Skin: Negative.  Negative for color change.  Neurological: Negative.  Negative for dizziness, weakness and light-headedness.  Hematological: Negative for adenopathy. Does not bruise/bleed easily.  Psychiatric/Behavioral: Positive for dysphoric mood and sleep disturbance. Negative for behavioral problems. The patient is nervous/anxious.     Objective:  BP 120/76   Pulse 65   Temp 97.9 F (36.6 C) (Oral)   Resp 16   Ht 5' (1.524 m)   Wt 124 lb (56.2 kg)   SpO2 94%   BMI 24.22 kg/m   BP Readings from Last 3 Encounters:  09/14/18 120/76  08/04/18 118/80  04/19/18 140/80    Wt Readings from Last 3 Encounters:  09/14/18 124 lb (56.2 kg)  08/09/18 122 lb (55.3 kg)  08/04/18 122 lb 1.9 oz (55.4 kg)    Physical Exam  Constitutional: She is oriented to person, place, and time. No distress.  HENT:  Mouth/Throat: Oropharynx is clear and moist. No oropharyngeal exudate.  Eyes: Conjunctivae are normal. No scleral icterus.  Neck: Normal range of motion. Neck supple. No JVD present. No thyromegaly present.  Cardiovascular: Normal  rate, regular rhythm and normal heart sounds. Exam reveals no gallop and no friction rub.  No murmur heard. Pulmonary/Chest: Effort normal and breath sounds normal. No respiratory distress. She has no wheezes. She has no rales.  Abdominal: Soft. Bowel sounds are normal. She exhibits no mass. There is no hepatosplenomegaly. There is no tenderness.  Musculoskeletal: Normal range of motion. She exhibits no edema, tenderness or deformity.  Lymphadenopathy:    She has no cervical adenopathy.  Neurological: She is alert and oriented to person, place, and time.  Skin:  Skin is warm and dry. She is not diaphoretic. No pallor.  Psychiatric: She has a normal mood and affect. Her behavior is normal. Judgment and thought content normal.  Vitals reviewed.   Lab Results  Component Value Date   WBC 5.0 11/08/2017   HGB 14.1 11/08/2017   HCT 41.7 11/08/2017   PLT 215.0 11/08/2017   GLUCOSE 105 (H) 03/16/2017   CHOL 132 09/14/2018   TRIG 66.0 09/14/2018   HDL 54.80 09/14/2018   LDLDIRECT 130.0 08/03/2011   LDLCALC 64 09/14/2018   ALT 11 03/16/2017   AST 14 03/16/2017   NA 136 03/16/2017   K 4.2 03/16/2017   CL 106 03/16/2017   CREATININE 0.85 03/16/2017   BUN 11 03/16/2017   CO2 24 03/16/2017   TSH 0.54 09/14/2018   HGBA1C 5.3 07/29/2015    Mr Angiogram Neck W Wo Contrast  Result Date: 04/23/2018 CLINICAL DATA:  Left-sided facial paresthesias with tightness and numbness. Creatinine was obtained on site at [place] at [address] Results: Creatinine 1.2 mg/dL. EXAM: MR HEAD WITHOUT CONTRAST MR CIRCLE OF WILLIS WITHOUT CONTRAST MRA OF THE NECK WITHOUT AND WITH CONTRAST TECHNIQUE: Multiplanar, multiecho pulse sequences of the brain, circle of willis and surrounding structures were obtained without intravenous contrast. Angiographic images of the neck were obtained using MRA technique without and with intravenous contrast. CONTRAST:  37mL MULTIHANCE GADOBENATE DIMEGLUMINE 529 MG/ML IV SOLN COMPARISON:  None. FINDINGS: MR HEAD FINDINGS Brain: There is no evidence of acute infarct, intracranial hemorrhage, mass, midline shift, or extra-axial fluid collection. There is mild generalized cerebral atrophy. Small foci of periventricular white matter T2 hyperintensity are nonspecific but compatible with mild chronic small vessel ischemic disease. Vascular: Major intracranial vascular flow voids are preserved. Skull and upper cervical spine: Unremarkable bone marrow signal. Sinuses/Orbits: Unremarkable orbits. Paranasal sinuses and mastoid air cells are  clear. Other: None. MR CIRCLE OF WILLIS FINDINGS The visualized distal vertebral arteries are widely patent to the basilar. Patent left PICA, bilateral AICA, and bilateral SCA origins are identified. The basilar artery is widely patent. There is a large right posterior communicating artery with a hypoplastic right P1 segment. No significant proximal PCA stenosis is identified allowing for mild motion artifact as well as predominant fetal supply the right PCA. The internal carotid arteries are patent from skull base to carotid termini without evidence of significant stenosis allowing for mild motion artifact. ACAs and MCAs are patent without evidence of proximal branch occlusion or significant stenosis. No aneurysm is identified. MRA NECK FINDINGS There is a standard 3 vessel aortic arch. The brachiocephalic and subclavian arteries are widely patent. The carotid arteries are patent without evidence of stenosis or dissection. The vertebral arteries are patent and codominant without evidence of stenosis or dissection. IMPRESSION: 1. No acute intracranial abnormality. 2. 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 3. Negative head MRA. 4. Negative neck MRA. Electronically Signed   By: Zenia Resides  Jeralyn Ruths M.D.   On: 04/23/2018 16:46   Mr Brain Wo Contrast  Result Date: 04/23/2018 CLINICAL DATA:  Left-sided facial paresthesias with tightness and numbness. Creatinine was obtained on site at [place] at [address] Results: Creatinine 1.2 mg/dL. EXAM: MR HEAD WITHOUT CONTRAST MR CIRCLE OF WILLIS WITHOUT CONTRAST MRA OF THE NECK WITHOUT AND WITH CONTRAST TECHNIQUE: Multiplanar, multiecho pulse sequences of the brain, circle of willis and surrounding structures were obtained without intravenous contrast. Angiographic images of the neck were obtained using MRA technique without and with intravenous contrast. CONTRAST:  81mL MULTIHANCE GADOBENATE DIMEGLUMINE 529 MG/ML IV SOLN COMPARISON:   None. FINDINGS: MR HEAD FINDINGS Brain: There is no evidence of acute infarct, intracranial hemorrhage, mass, midline shift, or extra-axial fluid collection. There is mild generalized cerebral atrophy. Small foci of periventricular white matter T2 hyperintensity are nonspecific but compatible with mild chronic small vessel ischemic disease. Vascular: Major intracranial vascular flow voids are preserved. Skull and upper cervical spine: Unremarkable bone marrow signal. Sinuses/Orbits: Unremarkable orbits. Paranasal sinuses and mastoid air cells are clear. Other: None. MR CIRCLE OF WILLIS FINDINGS The visualized distal vertebral arteries are widely patent to the basilar. Patent left PICA, bilateral AICA, and bilateral SCA origins are identified. The basilar artery is widely patent. There is a large right posterior communicating artery with a hypoplastic right P1 segment. No significant proximal PCA stenosis is identified allowing for mild motion artifact as well as predominant fetal supply the right PCA. The internal carotid arteries are patent from skull base to carotid termini without evidence of significant stenosis allowing for mild motion artifact. ACAs and MCAs are patent without evidence of proximal branch occlusion or significant stenosis. No aneurysm is identified. MRA NECK FINDINGS There is a standard 3 vessel aortic arch. The brachiocephalic and subclavian arteries are widely patent. The carotid arteries are patent without evidence of stenosis or dissection. The vertebral arteries are patent and codominant without evidence of stenosis or dissection. IMPRESSION: 1. No acute intracranial abnormality. 2. 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 3. Negative head MRA. 4. Negative neck MRA. Electronically Signed   By: Logan Bores M.D.   On: 04/23/2018 16:46   Mr Jodene Nam Head Wo Contrast  Result Date: 04/23/2018 CLINICAL DATA:  Left-sided facial paresthesias with tightness and  numbness. Creatinine was obtained on site at [place] at [address] Results: Creatinine 1.2 mg/dL. EXAM: MR HEAD WITHOUT CONTRAST MR CIRCLE OF WILLIS WITHOUT CONTRAST MRA OF THE NECK WITHOUT AND WITH CONTRAST TECHNIQUE: Multiplanar, multiecho pulse sequences of the brain, circle of willis and surrounding structures were obtained without intravenous contrast. Angiographic images of the neck were obtained using MRA technique without and with intravenous contrast. CONTRAST:  77mL MULTIHANCE GADOBENATE DIMEGLUMINE 529 MG/ML IV SOLN COMPARISON:  None. FINDINGS: MR HEAD FINDINGS Brain: There is no evidence of acute infarct, intracranial hemorrhage, mass, midline shift, or extra-axial fluid collection. There is mild generalized cerebral atrophy. Small foci of periventricular white matter T2 hyperintensity are nonspecific but compatible with mild chronic small vessel ischemic disease. Vascular: Major intracranial vascular flow voids are preserved. Skull and upper cervical spine: Unremarkable bone marrow signal. Sinuses/Orbits: Unremarkable orbits. Paranasal sinuses and mastoid air cells are clear. Other: None. MR CIRCLE OF WILLIS FINDINGS The visualized distal vertebral arteries are widely patent to the basilar. Patent left PICA, bilateral AICA, and bilateral SCA origins are identified. The basilar artery is widely patent. There is a large right posterior communicating artery with  a hypoplastic right P1 segment. No significant proximal PCA stenosis is identified allowing for mild motion artifact as well as predominant fetal supply the right PCA. The internal carotid arteries are patent from skull base to carotid termini without evidence of significant stenosis allowing for mild motion artifact. ACAs and MCAs are patent without evidence of proximal branch occlusion or significant stenosis. No aneurysm is identified. MRA NECK FINDINGS There is a standard 3 vessel aortic arch. The brachiocephalic and  subclavian arteries are widely patent. The carotid arteries are patent without evidence of stenosis or dissection. The vertebral arteries are patent and codominant without evidence of stenosis or dissection. IMPRESSION: 1. No acute intracranial abnormality. 2. 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 3. Negative head MRA. 4. Negative neck MRA. Electronically Signed   By: Logan Bores M.D.   On: 04/23/2018 16:46    Assessment & Plan:   Tyrica was seen today for hypothyroidism and hyperlipidemia.  Diagnoses and all orders for this visit:  Hypothyroidism, unspecified type -     TSH; Future  Tachycardia- Her sx's and heart rate are well controlled with carvedilol. -     carvedilol (COREG) 3.125 MG tablet; TAKE 1 TABLET TWICE DAILY WITH A MEAL  Acquired hypothyroidism- Her TSH is in the normal range.  She will continue the current dose of thyroid supplementation. -     thyroid (NP THYROID) 60 MG tablet; Take 1 tablet (60 mg total) by mouth daily before breakfast.  Gastroesophageal reflux disease without esophagitis- Her symptoms are well controlled with the PPI. -     pantoprazole (PROTONIX) 40 MG tablet; Take 1 tablet (40 mg total) by mouth daily as needed (acid reflux).  Hyperlipidemia with target LDL less than 130- She has achieved her LDL goal and is doing well on the statin. -     Pitavastatin Calcium (LIVALO) 2 MG TABS; Take 1 tablet (2 mg total) by mouth daily. -     Lipid panel; Future  Menopausal hot flushes- will restart the ERT   I am having Gennell P. Scogin maintain her Vitamin B-12 CR, Fish Oil, Vitamin D3, CALCIUM PO, aspirin, nitroGLYCERIN, Nutritional Supplements (DHEA PO), ALPRAZolam, carvedilol, pantoprazole, Pitavastatin Calcium, and thyroid.  Meds ordered this encounter  Medications  . carvedilol (COREG) 3.125 MG tablet    Sig: TAKE 1 TABLET TWICE DAILY WITH A MEAL    Dispense:  180 tablet    Refill:  1  . pantoprazole (PROTONIX) 40 MG  tablet    Sig: Take 1 tablet (40 mg total) by mouth daily as needed (acid reflux).    Dispense:  90 tablet    Refill:  1  . Pitavastatin Calcium (LIVALO) 2 MG TABS    Sig: Take 1 tablet (2 mg total) by mouth daily.    Dispense:  90 tablet    Refill:  1  . thyroid (NP THYROID) 60 MG tablet    Sig: Take 1 tablet (60 mg total) by mouth daily before breakfast.    Dispense:  90 tablet    Refill:  1     Follow-up: Return in about 6 months (around 03/16/2019).  Scarlette Calico, MD

## 2018-10-04 ENCOUNTER — Encounter: Payer: Self-pay | Admitting: Internal Medicine

## 2018-10-05 ENCOUNTER — Other Ambulatory Visit: Payer: Self-pay | Admitting: Internal Medicine

## 2018-10-05 DIAGNOSIS — F410 Panic disorder [episodic paroxysmal anxiety] without agoraphobia: Secondary | ICD-10-CM

## 2018-10-05 MED ORDER — ALPRAZOLAM 0.25 MG PO TABS: ORAL_TABLET | ORAL | 0 refills | Status: DC

## 2018-10-23 ENCOUNTER — Other Ambulatory Visit: Payer: Self-pay | Admitting: Dermatology

## 2018-10-23 DIAGNOSIS — D485 Neoplasm of uncertain behavior of skin: Secondary | ICD-10-CM | POA: Diagnosis not present

## 2018-10-23 DIAGNOSIS — D229 Melanocytic nevi, unspecified: Secondary | ICD-10-CM | POA: Diagnosis not present

## 2018-10-23 DIAGNOSIS — L821 Other seborrheic keratosis: Secondary | ICD-10-CM | POA: Diagnosis not present

## 2018-10-23 DIAGNOSIS — D225 Melanocytic nevi of trunk: Secondary | ICD-10-CM | POA: Diagnosis not present

## 2018-11-08 ENCOUNTER — Ambulatory Visit: Payer: Medicare Other | Admitting: Gastroenterology

## 2018-11-08 ENCOUNTER — Encounter: Payer: Self-pay | Admitting: Gastroenterology

## 2018-11-08 VITALS — BP 128/70 | HR 72 | Ht 60.0 in | Wt 126.0 lb

## 2018-11-08 DIAGNOSIS — M545 Low back pain, unspecified: Secondary | ICD-10-CM

## 2018-11-08 DIAGNOSIS — K5909 Other constipation: Secondary | ICD-10-CM | POA: Diagnosis not present

## 2018-11-08 DIAGNOSIS — K6289 Other specified diseases of anus and rectum: Secondary | ICD-10-CM | POA: Diagnosis not present

## 2018-11-08 NOTE — Patient Instructions (Signed)
Continue Metamucil daily  Take Miralax 1 capful daily as needed  Increase water intake 8-10 cups daily  Follow up in 6 months   Thank you for choosing [facility]  Karleen Hampshire Nandigam,MD

## 2018-11-08 NOTE — Progress Notes (Signed)
[name]    [id number]    03-13-1941  Primary Care Physician:Jones, Arvid Right, MD  Referring Physician: Janith Lima, MD [address], [place] [address]  Chief complaint:  Rectal pain  HPI: 77 year old female previously followed by Dr. Sharlett Iles, last seen in April 2014 is here to reestablish care. She woke up last Friday morning with lower back sharp pain and since then is having dull ache in the lower back radiating to the rectum.  Pain is worse when she sits in certain positions or for prolonged period of time.  Denies any pain during defecation.  No rectal bleeding, dark stool or blood in stool.  Denies any recent change in bowel habits. She is worried about potential colorectal cancer Denies any loss of appetite or weight loss.  She has gained 5 or 6 pounds in the past 2 months  Colonoscopy November 08, 2011 by Dr. Sharlett Iles: Normal with no polyps   Outpatient Encounter Medications as of 11/08/2018  Medication Sig  . ALPRAZolam (XANAX) 0.25 MG tablet TAKE ONE TABLET BY MOUTH THREE TIMES DAILY AS NEEDED FOR SLEEP OR ANXIETY  . aspirin 81 MG tablet Take 81 mg by mouth daily.   Marland Kitchen CALCIUM PO Take 1-2 capsules by mouth at bedtime.   . carvedilol (COREG) 3.125 MG tablet TAKE 1 TABLET TWICE DAILY WITH A MEAL  . Cholecalciferol (VITAMIN D3) 5000 UNITS TABS Take 1 tablet by mouth daily.   . Cyanocobalamin (VITAMIN B-12 CR) 1500 MCG TBCR Take 1 tablet by mouth 3 (three) times a week.   . nitroGLYCERIN (NITROSTAT) 0.4 MG SL tablet Place 1 tablet (0.4 mg total) under the tongue every 5 (five) minutes as needed.  . Nutritional Supplements (DHEA PO) Take 5 mg by mouth. 5 days per week  . Omega-3 Fatty Acids (FISH OIL) 1000 MG CAPS Take 2 capsules by mouth daily.   . pantoprazole (PROTONIX) 40 MG tablet Take 1 tablet (40 mg total) by mouth daily as needed (acid reflux).  . Pitavastatin Calcium (LIVALO) 2 MG TABS Take 1 tablet (2 mg total) by mouth  daily.  Marland Kitchen thyroid (NP THYROID) 60 MG tablet Take 1 tablet (60 mg total) by mouth daily before breakfast.   No facility-administered encounter medications on file as of 11/08/2018.     Allergies as of 11/08/2018  . (No Known Allergies)    Past Medical History:  Diagnosis Date  . CHEST PAIN 04/30/2009  . COMMON MIGRAINE 04/30/2009  . CONSTIPATION 04/30/2009  . ESOPHAGEAL STRICTURE 04/30/2009  . GERD 04/30/2009  . HIATAL HERNIA 04/30/2009  . Hx of cardiovascular stress test    ETT-Myoview (04/2014):  No ischemia, EF 85%; Normal // Myoview 11/17: EF 92, no scar or ischemia, Low Risk // Nuclear stress test 9/19: EF 84, no ischemia; Low Risk   . Hx of echocardiogram    Echo (12/15):  EF 55-60%, no RWMA, normal diast function, MAC, LA 33 mm  . Hyperlipidemia   . HYPOTHYROIDISM 04/30/2009  . Irritable bowel syndrome 04/30/2009  . RAYNAUD'S DISEASE 04/30/2009    Past Surgical History:  Procedure Laterality Date  . ABDOMINAL HYSTERECTOMY  1976  . COLONOSCOPY    . Cement  . foot surgury  1997  . RHINOPLASTY  1980  . TONSILLECTOMY  1990  . UPPER GASTROINTESTINAL ENDOSCOPY      Family History  Problem Relation Age of Onset  .  Heart disease Father   . Heart attack Father   . Cancer Sister        breast cancer  . Diabetes Maternal Aunt   . Diabetes Maternal Uncle   . Cancer Paternal Aunt        lung cancer  . Diabetes Maternal Grandmother   . Heart disease Paternal Grandmother   . Heart disease Paternal Grandfather   . Stroke Cousin   . Migraines Daughter   . Colon cancer Neg Hx     Social History   Socioeconomic History  . Marital status: Widowed    Spouse name: Not on file  . Number of children: 2  . Years of education: Not on file  . Highest education level: Not on file  Occupational History  . Occupation: Financial trader: RETIRED  Social Needs  . Financial resource strain: Not on file  . Food insecurity:    Worry: Not on file     Inability: Not on file  . Transportation needs:    Medical: Not on file    Non-medical: Not on file  Tobacco Use  . Smoking status: Former Smoker    Last attempt to quit: 10/27/1984    Years since quitting: 34.0  . Smokeless tobacco: Never Used  . Tobacco comment: quit 1985  Substance and Sexual Activity  . Alcohol use: Yes    Alcohol/week: 0.0 standard drinks    Comment: 1-2 per week  . Drug use: No  . Sexual activity: Not Currently    Birth control/protection: Post-menopausal  Lifestyle  . Physical activity:    Days per week: Not on file    Minutes per session: Not on file  . Stress: Not on file  Relationships  . Social connections:    Talks on phone: Not on file    Gets together: Not on file    Attends religious service: Not on file    Active member of club or organization: Not on file    Attends meetings of clubs or organizations: Not on file    Relationship status: Not on file  . Intimate partner violence:    Fear of current or ex partner: Not on file    Emotionally abused: Not on file    Physically abused: Not on file    Forced sexual activity: Not on file  Other Topics Concern  . Not on file  Social History Narrative   Patients gets regular exercise.   Patient lives alone in a one story home with a basement.  She is a widow.  Has 2 children.  Retired from [employer].  Education: some college.      Review of systems: Review of Systems  Constitutional: Negative for fever and chills.  HENT: Negative.   Eyes: Negative for blurred vision.  Respiratory: Negative for cough, shortness of breath and wheezing.   Cardiovascular: Negative for chest pain and palpitations.  Gastrointestinal: as per HPI Genitourinary: Negative for dysuria, urgency, frequency and hematuria.  Musculoskeletal: Negative for myalgias and joint pain.  Positive for back pain Skin: Negative for itching and rash.  Neurological: Negative for dizziness, tremors, focal weakness, seizures  and loss of consciousness.  Endo/Heme/Allergies: Positive for seasonal allergies.  Psychiatric/Behavioral: Negative for depression, suicidal ideas and hallucinations.  All other systems reviewed and are negative.   Physical Exam: Vitals:   11/08/18 0910  BP: 128/70  Pulse: 72   Body mass index is 24.61 kg/m. Gen:      No  acute distress HEENT:  EOMI, sclera anicteric Neck:     No masses; no thyromegaly Lungs:    Clear to auscultation bilaterally; normal respiratory effort CV:         Regular rate and rhythm; no murmurs Abd:      + bowel sounds; soft, non-tender; no palpable masses, no distension Ext:    No edema; adequate peripheral perfusion Skin:      Warm and dry; no rash Neuro: alert and oriented x 3 Psych: normal mood and affect Rectal exam: Normal anal sphincter tone, no anal fissure or external hemorrhoids Anoscopy: Small internal hemorrhoids, no active bleeding, normal dentate line, no visible nodules  Data Reviewed:  Reviewed labs, radiology imaging, old records and pertinent past GI work up   Assessment and Plan/Recommendations:  77 year old female with complaints of low back pain radiating to the rectum No evidence of anal fissure or thrombosed hemorrhoids on rectal exam.  Anoscopy showed normal anal mucosa with no visible nodules.  Colonoscopy 2012 was normal, recommended recall in 10 years by Dr. Sharlett Iles Lower back pain radiating to the rectum is likely musculoskeletal She does not have any alarming symptoms, reassured patient and advised her to call if she develops any rectal bleeding, change in bowel habits, abdominal pain, loss of appetite or weight loss Due for screening colonoscopy in December 2022  Intermittent constipation: Continue Metamucil daily Increase water intake to 8 to 10 cups daily Start MiraLAX 1 capful daily as needed  Return in 6 months or sooner if needed  Greater than 50% of the time used for counseling as well as treatment plan and  follow-up. She had multiple questions which were answered to her satisfaction  K. Denzil Magnuson , MD [phone]    CC: Janith Lima, MD

## 2018-11-09 ENCOUNTER — Encounter: Payer: Self-pay | Admitting: Gastroenterology

## 2018-11-20 DIAGNOSIS — Z803 Family history of malignant neoplasm of breast: Secondary | ICD-10-CM | POA: Diagnosis not present

## 2018-11-20 DIAGNOSIS — Z1231 Encounter for screening mammogram for malignant neoplasm of breast: Secondary | ICD-10-CM | POA: Diagnosis not present

## 2018-11-20 LAB — HM MAMMOGRAPHY

## 2018-12-13 ENCOUNTER — Encounter: Payer: Self-pay | Admitting: Internal Medicine

## 2018-12-13 NOTE — Progress Notes (Signed)
Abstracted and sent to scan  

## 2019-03-26 ENCOUNTER — Ambulatory Visit: Payer: Medicare Other | Admitting: Physician Assistant

## 2019-03-27 ENCOUNTER — Ambulatory Visit: Payer: Medicare Other | Admitting: Physician Assistant

## 2019-04-04 ENCOUNTER — Other Ambulatory Visit: Payer: Self-pay | Admitting: Internal Medicine

## 2019-04-04 DIAGNOSIS — R000 Tachycardia, unspecified: Secondary | ICD-10-CM

## 2019-04-04 DIAGNOSIS — F410 Panic disorder [episodic paroxysmal anxiety] without agoraphobia: Secondary | ICD-10-CM

## 2019-04-04 DIAGNOSIS — E039 Hypothyroidism, unspecified: Secondary | ICD-10-CM

## 2019-04-04 DIAGNOSIS — E785 Hyperlipidemia, unspecified: Secondary | ICD-10-CM

## 2019-04-06 MED ORDER — ALPRAZOLAM 0.25 MG PO TABS: ORAL_TABLET | ORAL | 0 refills | Status: DC

## 2019-04-11 ENCOUNTER — Other Ambulatory Visit: Payer: Self-pay | Admitting: Internal Medicine

## 2019-04-11 DIAGNOSIS — R000 Tachycardia, unspecified: Secondary | ICD-10-CM

## 2019-04-11 DIAGNOSIS — E039 Hypothyroidism, unspecified: Secondary | ICD-10-CM

## 2019-04-11 DIAGNOSIS — E785 Hyperlipidemia, unspecified: Secondary | ICD-10-CM

## 2019-04-11 MED ORDER — CARVEDILOL 3.125 MG PO TABS: ORAL_TABLET | ORAL | 0 refills | Status: DC

## 2019-04-11 MED ORDER — PITAVASTATIN CALCIUM 2 MG PO TABS: 1.0000 | ORAL_TABLET | Freq: Every day | ORAL | 0 refills | Status: DC

## 2019-04-11 MED ORDER — THYROID 60 MG PO TABS
60.0000 mg | ORAL_TABLET | Freq: Every day | ORAL | 0 refills | Status: DC
Start: 2019-04-11 — End: 2019-04-13

## 2019-04-13 ENCOUNTER — Other Ambulatory Visit: Payer: Self-pay | Admitting: Internal Medicine

## 2019-04-13 DIAGNOSIS — E039 Hypothyroidism, unspecified: Secondary | ICD-10-CM

## 2019-04-17 ENCOUNTER — Other Ambulatory Visit: Payer: Self-pay | Admitting: Internal Medicine

## 2019-04-17 ENCOUNTER — Encounter: Payer: Self-pay | Admitting: Internal Medicine

## 2019-04-17 DIAGNOSIS — N951 Menopausal and female climacteric states: Secondary | ICD-10-CM

## 2019-04-17 MED ORDER — NONFORMULARY OR COMPOUNDED ITEM: 5 refills | Status: DC

## 2019-05-03 DIAGNOSIS — H2513 Age-related nuclear cataract, bilateral: Secondary | ICD-10-CM | POA: Diagnosis not present

## 2019-05-03 DIAGNOSIS — H0102B Squamous blepharitis left eye, upper and lower eyelids: Secondary | ICD-10-CM | POA: Diagnosis not present

## 2019-05-03 DIAGNOSIS — H0102A Squamous blepharitis right eye, upper and lower eyelids: Secondary | ICD-10-CM | POA: Diagnosis not present

## 2019-05-03 DIAGNOSIS — H10502 Unspecified blepharoconjunctivitis, left eye: Secondary | ICD-10-CM | POA: Diagnosis not present

## 2019-05-10 DIAGNOSIS — H0102A Squamous blepharitis right eye, upper and lower eyelids: Secondary | ICD-10-CM | POA: Diagnosis not present

## 2019-05-10 DIAGNOSIS — H0102B Squamous blepharitis left eye, upper and lower eyelids: Secondary | ICD-10-CM | POA: Diagnosis not present

## 2019-05-10 DIAGNOSIS — H10502 Unspecified blepharoconjunctivitis, left eye: Secondary | ICD-10-CM | POA: Diagnosis not present

## 2019-05-14 DIAGNOSIS — H04123 Dry eye syndrome of bilateral lacrimal glands: Secondary | ICD-10-CM | POA: Diagnosis not present

## 2019-05-14 DIAGNOSIS — H0102A Squamous blepharitis right eye, upper and lower eyelids: Secondary | ICD-10-CM | POA: Diagnosis not present

## 2019-05-14 DIAGNOSIS — H0102B Squamous blepharitis left eye, upper and lower eyelids: Secondary | ICD-10-CM | POA: Diagnosis not present

## 2019-05-14 DIAGNOSIS — H16142 Punctate keratitis, left eye: Secondary | ICD-10-CM | POA: Diagnosis not present

## 2019-05-28 DIAGNOSIS — H0102B Squamous blepharitis left eye, upper and lower eyelids: Secondary | ICD-10-CM | POA: Diagnosis not present

## 2019-05-28 DIAGNOSIS — H16142 Punctate keratitis, left eye: Secondary | ICD-10-CM | POA: Diagnosis not present

## 2019-05-28 DIAGNOSIS — H2513 Age-related nuclear cataract, bilateral: Secondary | ICD-10-CM | POA: Diagnosis not present

## 2019-05-28 DIAGNOSIS — H0102A Squamous blepharitis right eye, upper and lower eyelids: Secondary | ICD-10-CM | POA: Diagnosis not present

## 2019-05-28 DIAGNOSIS — H04123 Dry eye syndrome of bilateral lacrimal glands: Secondary | ICD-10-CM | POA: Diagnosis not present

## 2019-06-04 ENCOUNTER — Telehealth: Payer: Self-pay | Admitting: Internal Medicine

## 2019-06-04 NOTE — Telephone Encounter (Signed)
Pt scheduled  

## 2019-06-04 NOTE — Telephone Encounter (Signed)
Patient is calling to have labs ordered- Thyroid, estrogen, Complete lipid panel. Would the order is placed the patient would like to schedule a lab appt and appt to discuss her beta blocker and Blood Pressure.  CB- [phone].

## 2019-06-04 NOTE — Telephone Encounter (Signed)
We will need to see patient first.

## 2019-06-06 ENCOUNTER — Other Ambulatory Visit (INDEPENDENT_AMBULATORY_CARE_PROVIDER_SITE_OTHER): Payer: Medicare Other

## 2019-06-06 ENCOUNTER — Other Ambulatory Visit: Payer: Self-pay

## 2019-06-06 ENCOUNTER — Encounter: Payer: Self-pay | Admitting: Internal Medicine

## 2019-06-06 ENCOUNTER — Ambulatory Visit (INDEPENDENT_AMBULATORY_CARE_PROVIDER_SITE_OTHER): Payer: Medicare Other | Admitting: Internal Medicine

## 2019-06-06 VITALS — BP 136/76 | HR 65 | Temp 97.6°F | Resp 16 | Ht 60.0 in | Wt 126.5 lb

## 2019-06-06 DIAGNOSIS — R739 Hyperglycemia, unspecified: Secondary | ICD-10-CM

## 2019-06-06 DIAGNOSIS — R000 Tachycardia, unspecified: Secondary | ICD-10-CM | POA: Diagnosis not present

## 2019-06-06 DIAGNOSIS — E785 Hyperlipidemia, unspecified: Secondary | ICD-10-CM

## 2019-06-06 DIAGNOSIS — E039 Hypothyroidism, unspecified: Secondary | ICD-10-CM

## 2019-06-06 DIAGNOSIS — K219 Gastro-esophageal reflux disease without esophagitis: Secondary | ICD-10-CM

## 2019-06-06 LAB — BASIC METABOLIC PANEL
BUN: 12 mg/dL (ref 6–23)
CO2: 27 mEq/L (ref 19–32)
Calcium: 9.4 mg/dL (ref 8.4–10.5)
Chloride: 103 mEq/L (ref 96–112)
Creatinine, Ser: 0.78 mg/dL (ref 0.40–1.20)
GFR: 71.54 mL/min (ref >60.00)
Glucose, Bld: 101 mg/dL — ABNORMAL HIGH (ref 70–99)
Potassium: 4.4 mEq/L (ref 3.5–5.1)
Sodium: 137 mEq/L (ref 135–145)

## 2019-06-06 LAB — CBC WITH DIFFERENTIAL/PLATELET
Basophils Absolute: 0 10*3/uL (ref 0.0–0.1)
Basophils Relative: 1.1 % (ref 0.0–3.0)
Eosinophils Absolute: 0.1 10*3/uL (ref 0.0–0.7)
Eosinophils Relative: 1.5 % (ref 0.0–5.0)
HCT: 43.6 % (ref 36.0–46.0)
Hemoglobin: 14.6 g/dL (ref 12.0–15.0)
Lymphocytes Relative: 36.6 % (ref 12.0–46.0)
Lymphs Abs: 1.5 10*3/uL (ref 0.7–4.0)
MCHC: 33.5 g/dL (ref 30.0–36.0)
MCV: 93.1 fl (ref 78.0–100.0)
Monocytes Absolute: 0.3 10*3/uL (ref 0.1–1.0)
Monocytes Relative: 7.4 % (ref 3.0–12.0)
Neutro Abs: 2.2 10*3/uL (ref 1.4–7.7)
Neutrophils Relative %: 53.4 % (ref 43.0–77.0)
Platelets: 227 10*3/uL (ref 150.0–400.0)
RBC: 4.69 Mil/uL (ref 3.87–5.11)
RDW: 13.6 % (ref 11.5–15.5)
WBC: 4.2 10*3/uL (ref 4.0–10.5)

## 2019-06-06 LAB — HEPATIC FUNCTION PANEL
ALT: 15 U/L (ref 0–35)
AST: 19 U/L (ref 0–37)
Albumin: 4.5 g/dL (ref 3.5–5.2)
Alkaline Phosphatase: 44 U/L (ref 39–117)
Bilirubin, Direct: 0 mg/dL (ref 0.0–0.3)
Total Bilirubin: 0.6 mg/dL (ref 0.2–1.2)
Total Protein: 7.1 g/dL (ref 6.0–8.3)

## 2019-06-06 LAB — HEMOGLOBIN A1C: Hgb A1c MFr Bld: 5.5 % (ref 4.6–6.5)

## 2019-06-06 LAB — LIPID PANEL
Cholesterol: 169 mg/dL (ref 0–200)
HDL: 68.2 mg/dL (ref >39.00)
LDL Cholesterol: 91 mg/dL (ref 0–99)
NonHDL: 101.2
Total CHOL/HDL Ratio: 2
Triglycerides: 51 mg/dL (ref 0.0–149.0)
VLDL: 10.2 mg/dL (ref 0.0–40.0)

## 2019-06-06 MED ORDER — CARVEDILOL 3.125 MG PO TABS: 3.1250 mg | ORAL_TABLET | Freq: Every day | ORAL | 0 refills | Status: DC

## 2019-06-06 NOTE — Progress Notes (Signed)
Subjective:  Patient ID: [name], female    DOB: [birth year]  Age: 78 y.o. MRN: [id number]  CC: Hypothyroidism   HPI IMAGINE NEST presents for f/up - She complains of a several month history of low blood pressure and low heart rate.  This has been associated with dizziness and lightheadedness but no palpitations or syncope.  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  See the photograph of the vital signs that she has taken at home over the last 2 or 3 months.  Outpatient Medications Prior to Visit  Medication Sig Dispense Refill   ALPRAZolam (XANAX) 0.25 MG tablet TAKE ONE TABLET BY MOUTH THREE TIMES DAILY AS NEEDED FOR SLEEP OR ANXIETY 180 tablet 0   aspirin 81 MG tablet Take 81 mg by mouth daily.      CALCIUM PO Take 1-2 capsules by mouth at bedtime.      Cholecalciferol (VITAMIN D3) 5000 UNITS TABS Take 1 tablet by mouth daily.      Cyanocobalamin (VITAMIN B-12 CR) 1500 MCG TBCR Take 1 tablet by mouth 3 (three) times a week.      nitroGLYCERIN (NITROSTAT) 0.4 MG SL tablet Place 1 tablet (0.4 mg total) under the tongue every 5 (five) minutes as needed. 25 tablet 3   NONFORMULARY OR COMPOUNDED ITEM Triest (8-1-1) Progesterone/Testosterone (poly) (cinn) 1.25-200-0.375 mg troche daily. 30 each 5   NP THYROID 60 MG tablet TAKE 1 TABLET BY MOUTH ONCE DAILY BEFORE BREAKFAST 90 tablet 0   Nutritional Supplements (DHEA PO) Take 5 mg by mouth. 5 days per week     Omega-3 Fatty Acids (FISH OIL) 1000 MG CAPS Take 2 capsules by mouth daily.      pantoprazole (PROTONIX) 40 MG tablet Take 1 tablet (40 mg total) by mouth daily as needed (acid reflux). 90 tablet 1   Pitavastatin Calcium (LIVALO) 2 MG TABS Take 1 tablet (2 mg total) by mouth daily. 90 tablet 0   carvedilol (COREG) 3.125 MG tablet TAKE 1 TABLET BY MOUTH TWICE DAILY WITH A MEAL 180 tablet 0   No facility-administered medications prior to visit.     ROS Review of Systems  Constitutional:  Negative.  Negative for diaphoresis, fatigue and unexpected weight change.  HENT: Negative.   Eyes: Negative for visual disturbance.  Respiratory: Negative for cough, chest tightness, shortness of breath and wheezing.   Cardiovascular: Negative for chest pain, palpitations and leg swelling.  Gastrointestinal: Negative for abdominal pain, diarrhea, nausea and vomiting.  Endocrine: Negative.  Negative for cold intolerance and heat intolerance.  Genitourinary: Negative.  Negative for difficulty urinating.  Musculoskeletal: Negative.  Negative for myalgias.  Skin: Negative.  Negative for color change.  Neurological: Positive for dizziness and light-headedness. Negative for syncope and weakness.  Hematological: Negative for adenopathy. Does not bruise/bleed easily.    Objective:  BP 136/76 (BP Location: Left Arm, Patient Position: Sitting, Cuff Size: Normal)    Pulse 65    Temp 97.6 F (36.4 C) (Oral)    Resp 16    Ht 5' (1.524 m)    Wt 126 lb 8 oz (57.4 kg)    SpO2 97%    BMI 24.71 kg/m   BP Readings from Last 3 Encounters:  06/06/19 136/76  11/08/18 128/70  09/14/18 120/76    Wt Readings from Last 3 Encounters:  06/06/19 126 lb 8 oz (57.4 kg)  11/08/18 126 lb (57.2 kg)  09/14/18 124 lb (56.2 kg)  Physical Exam Constitutional:      Appearance: She is not ill-appearing or diaphoretic.  HENT:     Nose: Nose normal. No congestion.     Mouth/Throat:     Pharynx: Oropharynx is clear.  Eyes:     General: No scleral icterus.    Conjunctiva/sclera: Conjunctivae normal.  Neck:     Musculoskeletal: Normal range of motion. No neck rigidity or muscular tenderness.  Cardiovascular:     Rate and Rhythm: Normal rate and regular rhythm.     Heart sounds: No murmur. No gallop.      Comments: EKG ----  Sinus  Rhythm  WITHIN NORMAL LIMITS   Pulmonary:     Effort: Pulmonary effort is normal.     Breath sounds: No stridor. No wheezing, rhonchi or rales.  Abdominal:     General:  Abdomen is flat. Bowel sounds are normal. There is no distension.     Palpations: There is no hepatomegaly or splenomegaly.     Tenderness: There is no abdominal tenderness.  Musculoskeletal: Normal range of motion.     Right lower leg: No edema.     Left lower leg: No edema.  Lymphadenopathy:     Cervical: No cervical adenopathy.  Skin:    General: Skin is warm.     Coloration: Skin is not pale.  Neurological:     General: No focal deficit present.     Mental Status: She is alert and oriented to person, place, and time. Mental status is at baseline.  Psychiatric:        Mood and Affect: Mood normal.        Behavior: Behavior normal.     Lab Results  Component Value Date   WBC 4.2 06/06/2019   HGB 14.6 06/06/2019   HCT 43.6 06/06/2019   PLT 227.0 06/06/2019   GLUCOSE 101 (H) 06/06/2019   CHOL 169 06/06/2019   TRIG 51.0 06/06/2019   HDL 68.20 06/06/2019   LDLDIRECT 130.0 08/03/2011   LDLCALC 91 06/06/2019   ALT 15 06/06/2019   AST 19 06/06/2019   NA 137 06/06/2019   K 4.4 06/06/2019   CL 103 06/06/2019   CREATININE 0.78 06/06/2019   BUN 12 06/06/2019   CO2 27 06/06/2019   TSH 1.41 06/06/2019   HGBA1C 5.5 06/06/2019    Mr Angiogram Neck W Wo Contrast  Result Date: 04/23/2018 CLINICAL DATA:  Left-sided facial paresthesias with tightness and numbness. Creatinine was obtained on site at [facility] at [address] Results: Creatinine 1.2 mg/dL. EXAM: MR HEAD WITHOUT CONTRAST MR CIRCLE OF WILLIS WITHOUT CONTRAST MRA OF THE NECK WITHOUT AND WITH CONTRAST TECHNIQUE: Multiplanar, multiecho pulse sequences of the brain, circle of willis and surrounding structures were obtained without intravenous contrast. Angiographic images of the neck were obtained using MRA technique without and with intravenous contrast. CONTRAST:  62mL MULTIHANCE GADOBENATE DIMEGLUMINE 529 MG/ML IV SOLN COMPARISON:  None. FINDINGS: MR HEAD FINDINGS Brain: There is no evidence of acute infarct,  intracranial hemorrhage, mass, midline shift, or extra-axial fluid collection. There is mild generalized cerebral atrophy. Small foci of periventricular white matter T2 hyperintensity are nonspecific but compatible with mild chronic small vessel ischemic disease. Vascular: Major intracranial vascular flow voids are preserved. Skull and upper cervical spine: Unremarkable bone marrow signal. Sinuses/Orbits: Unremarkable orbits. Paranasal sinuses and mastoid air cells are clear. Other: None. MR CIRCLE OF WILLIS FINDINGS The visualized distal vertebral arteries are widely patent to the basilar. Patent left PICA, bilateral AICA,  and bilateral SCA origins are identified. The basilar artery is widely patent. There is a large right posterior communicating artery with a hypoplastic right P1 segment. No significant proximal PCA stenosis is identified allowing for mild motion artifact as well as predominant fetal supply the right PCA. The internal carotid arteries are patent from skull base to carotid termini without evidence of significant stenosis allowing for mild motion artifact. ACAs and MCAs are patent without evidence of proximal branch occlusion or significant stenosis. No aneurysm is identified. MRA NECK FINDINGS There is a standard 3 vessel aortic arch. The brachiocephalic and subclavian arteries are widely patent. The carotid arteries are patent without evidence of stenosis or dissection. The vertebral arteries are patent and codominant without evidence of stenosis or dissection. IMPRESSION: 1. No acute intracranial abnormality. 2. 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 3. Negative head MRA. 4. Negative neck MRA. Electronically Signed   By: Logan Bores M.D.   On: 04/23/2018 16:46   Mr Brain Wo Contrast  Result Date: 04/23/2018 CLINICAL DATA:  Left-sided facial paresthesias with tightness and numbness. Creatinine was obtained on site at [facility] at [address]  Results: Creatinine 1.2 mg/dL. EXAM: MR HEAD WITHOUT CONTRAST MR CIRCLE OF WILLIS WITHOUT CONTRAST MRA OF THE NECK WITHOUT AND WITH CONTRAST TECHNIQUE: Multiplanar, multiecho pulse sequences of the brain, circle of willis and surrounding structures were obtained without intravenous contrast. Angiographic images of the neck were obtained using MRA technique without and with intravenous contrast. CONTRAST:  24mL MULTIHANCE GADOBENATE DIMEGLUMINE 529 MG/ML IV SOLN COMPARISON:  None. FINDINGS: MR HEAD FINDINGS Brain: There is no evidence of acute infarct, intracranial hemorrhage, mass, midline shift, or extra-axial fluid collection. There is mild generalized cerebral atrophy. Small foci of periventricular white matter T2 hyperintensity are nonspecific but compatible with mild chronic small vessel ischemic disease. Vascular: Major intracranial vascular flow voids are preserved. Skull and upper cervical spine: Unremarkable bone marrow signal. Sinuses/Orbits: Unremarkable orbits. Paranasal sinuses and mastoid air cells are clear. Other: None. MR CIRCLE OF WILLIS FINDINGS The visualized distal vertebral arteries are widely patent to the basilar. Patent left PICA, bilateral AICA, and bilateral SCA origins are identified. The basilar artery is widely patent. There is a large right posterior communicating artery with a hypoplastic right P1 segment. No significant proximal PCA stenosis is identified allowing for mild motion artifact as well as predominant fetal supply the right PCA. The internal carotid arteries are patent from skull base to carotid termini without evidence of significant stenosis allowing for mild motion artifact. ACAs and MCAs are patent without evidence of proximal branch occlusion or significant stenosis. No aneurysm is identified. MRA NECK FINDINGS There is a standard 3 vessel aortic arch. The brachiocephalic and subclavian arteries are widely patent. The carotid arteries are patent without evidence of  stenosis or dissection. The vertebral arteries are patent and codominant without evidence of stenosis or dissection. IMPRESSION: 1. No acute intracranial abnormality. 2. 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 3. Negative head MRA. 4. Negative neck MRA. Electronically Signed   By: Logan Bores M.D.   On: 04/23/2018 16:46   Mr Jodene Nam Head Wo Contrast  Result Date: 04/23/2018 CLINICAL DATA:  Left-sided facial paresthesias with tightness and numbness. Creatinine was obtained on site at [facility] at [address] Results: Creatinine 1.2 mg/dL. EXAM: MR HEAD WITHOUT CONTRAST MR CIRCLE OF WILLIS WITHOUT CONTRAST MRA OF THE NECK WITHOUT AND WITH CONTRAST TECHNIQUE: Multiplanar, multiecho  pulse sequences of the brain, circle of willis and surrounding structures were obtained without intravenous contrast. Angiographic images of the neck were obtained using MRA technique without and with intravenous contrast. CONTRAST:  3mL MULTIHANCE GADOBENATE DIMEGLUMINE 529 MG/ML IV SOLN COMPARISON:  None. FINDINGS: MR HEAD FINDINGS Brain: There is no evidence of acute infarct, intracranial hemorrhage, mass, midline shift, or extra-axial fluid collection. There is mild generalized cerebral atrophy. Small foci of periventricular white matter T2 hyperintensity are nonspecific but compatible with mild chronic small vessel ischemic disease. Vascular: Major intracranial vascular flow voids are preserved. Skull and upper cervical spine: Unremarkable bone marrow signal. Sinuses/Orbits: Unremarkable orbits. Paranasal sinuses and mastoid air cells are clear. Other: None. MR CIRCLE OF WILLIS FINDINGS The visualized distal vertebral arteries are widely patent to the basilar. Patent left PICA, bilateral AICA, and bilateral SCA origins are identified. The basilar artery is widely patent. There is a large right posterior communicating artery with a hypoplastic right P1 segment. No significant proximal PCA  stenosis is identified allowing for mild motion artifact as well as predominant fetal supply the right PCA. The internal carotid arteries are patent from skull base to carotid termini without evidence of significant stenosis allowing for mild motion artifact. ACAs and MCAs are patent without evidence of proximal branch occlusion or significant stenosis. No aneurysm is identified. MRA NECK FINDINGS There is a standard 3 vessel aortic arch. The brachiocephalic and subclavian arteries are widely patent. The carotid arteries are patent without evidence of stenosis or dissection. The vertebral arteries are patent and codominant without evidence of stenosis or dissection. IMPRESSION: 1. No acute intracranial abnormality. 2. 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 3. Negative head MRA. 4. Negative neck MRA. Electronically Signed   By: Logan Bores M.D.   On: 04/23/2018 16:46    Assessment & Plan:   Hannah was seen today for hypothyroidism.  Diagnoses and all orders for this visit:  Acquired hypothyroidism- Her TSH is in the normal range.  She will remain on the current dose of levothyroxine. -     Thyroid Panel With TSH; Future  Tachycardia- She is no longer tachycardic and is not tolerating the current dose of carvedilol.  I have asked her to decrease the dose from twice a day to once a day.  I anticipate tapering off of this over the next few months.  Her EKG shows normal sinus rhythm today.  Her labs are negative for secondary causes of mild hypotension and bradycardia. -     EKG 12-Lead -     Thyroid Panel With TSH; Future -     carvedilol (COREG) 3.125 MG tablet; Take 1 tablet (3.125 mg total) by mouth daily. TAKE 1 TABLET BY MOUTH TWICE DAILY WITH A MEAL  Gastroesophageal reflux disease without esophagitis- Her symptoms are well controlled. -     CBC with Differential/Platelet; Future  Hyperglycemia- Her blood sugars are normal. -     Basic metabolic panel; Future -      Hepatic function panel; Future -     Hemoglobin A1c; Future  Hyperlipidemia with target LDL less than 130- She has achieved her LDL goal and is doing well on the statin. -     Lipid panel; Future   I have changed Zena Amos. Weckwerth's carvedilol. I am also having her maintain her Vitamin B-12 CR, Fish Oil, Vitamin D3, CALCIUM PO, aspirin, nitroGLYCERIN, Nutritional Supplements (DHEA PO), pantoprazole, ALPRAZolam, Pitavastatin Calcium, NP Thyroid, and NONFORMULARY OR COMPOUNDED ITEM.  Meds ordered this encounter  Medications   carvedilol (COREG) 3.125 MG tablet    Sig: Take 1 tablet (3.125 mg total) by mouth daily. TAKE 1 TABLET BY MOUTH TWICE DAILY WITH A MEAL    Dispense:  30 tablet    Refill:  0     Follow-up: Return in about 4 weeks (around 07/04/2019).  Scarlette Calico, MD

## 2019-06-06 NOTE — Patient Instructions (Signed)
Hypothyroidism  Hypothyroidism is when the thyroid gland does not make enough of certain hormones (it is underactive). The thyroid gland is a small gland located in the lower front part of the neck, just in front of the windpipe (trachea). This gland makes hormones that help control how the body uses food for energy (metabolism) as well as how the heart and brain function. These hormones also play a role in keeping your bones strong. When the thyroid is underactive, it produces too little of the hormones thyroxine (T4) and triiodothyronine (T3). What are the causes? This condition may be caused by:  Hashimoto's disease. This is a disease in which the body's disease-fighting system (immune system) attacks the thyroid gland. This is the most common cause.  Viral infections.  Pregnancy.  Certain medicines.  Birth defects.  Past radiation treatments to the head or neck for cancer.  Past treatment with radioactive iodine.  Past exposure to radiation in the environment.  Past surgical removal of part or all of the thyroid.  Problems with a gland in the center of the brain (pituitary gland).  Lack of enough iodine in the diet. What increases the risk? You are more likely to develop this condition if:  You are female.  You have a family history of thyroid conditions.  You use a medicine called lithium.  You take medicines that affect the immune system (immunosuppressants). What are the signs or symptoms? Symptoms of this condition include:  Feeling as though you have no energy (lethargy).  Not being able to tolerate cold.  Weight gain that is not explained by a change in diet or exercise habits.  Lack of appetite.  Dry skin.  Coarse hair.  Menstrual irregularity.  Slowing of thought processes.  Constipation.  Sadness or depression. How is this diagnosed? This condition may be diagnosed based on:  Your symptoms, your medical history, and a physical exam.  Blood  tests. You may also have imaging tests, such as an ultrasound or MRI. How is this treated? This condition is treated with medicine that replaces the thyroid hormones that your body does not make. After you begin treatment, it may take several weeks for symptoms to go away. Follow these instructions at home:  Take over-the-counter and prescription medicines only as told by your health care provider.  If you start taking any new medicines, tell your health care provider.  Keep all follow-up visits as told by your health care provider. This is important. ? As your condition improves, your dosage of thyroid hormone medicine may change. ? You will need to have blood tests regularly so that your health care provider can monitor your condition. Contact a health care provider if:  Your symptoms do not get better with treatment.  You are taking thyroid replacement medicine and you: ? Sweat a lot. ? Have tremors. ? Feel anxious. ? Lose weight rapidly. ? Cannot tolerate heat. ? Have emotional swings. ? Have diarrhea. ? Feel weak. Get help right away if you have:  Chest pain.  An irregular heartbeat.  A rapid heartbeat.  Difficulty breathing. Summary  Hypothyroidism is when the thyroid gland does not make enough of certain hormones (it is underactive).  When the thyroid is underactive, it produces too little of the hormones thyroxine (T4) and triiodothyronine (T3).  The most common cause is Hashimoto's disease, a disease in which the body's disease-fighting system (immune system) attacks the thyroid gland. The condition can also be caused by viral infections, medicine, pregnancy, or past   radiation treatment to the head or neck.  Symptoms may include weight gain, dry skin, constipation, feeling as though you do not have energy, and not being able to tolerate cold.  This condition is treated with medicine to replace the thyroid hormones that your body does not make. This information  is not intended to replace advice given to you by your health care provider. Make sure you discuss any questions you have with your health care provider. Document Released: 11/08/2005 Document Revised: 10/21/2017 Document Reviewed: 10/19/2017 Elsevier Patient Education  2020 [employer]  

## 2019-06-07 ENCOUNTER — Encounter: Payer: Self-pay | Admitting: Internal Medicine

## 2019-06-07 LAB — THYROID PANEL WITH TSH
Free Thyroxine Index: 1.6 (ref 1.4–3.8)
T3 Uptake: 25 % (ref 22–35)
T4, Total: 6.5 ug/dL (ref 5.1–11.9)
TSH: 1.41 mIU/L (ref 0.40–4.50)

## 2019-06-29 ENCOUNTER — Telehealth: Payer: Medicare Other | Admitting: Physician Assistant

## 2019-07-05 ENCOUNTER — Ambulatory Visit (INDEPENDENT_AMBULATORY_CARE_PROVIDER_SITE_OTHER): Payer: Medicare Other | Admitting: Internal Medicine

## 2019-07-05 ENCOUNTER — Encounter: Payer: Self-pay | Admitting: Internal Medicine

## 2019-07-05 ENCOUNTER — Other Ambulatory Visit: Payer: Self-pay

## 2019-07-05 VITALS — BP 136/76 | HR 70 | Temp 97.9°F | Resp 16 | Ht 60.0 in | Wt 129.0 lb

## 2019-07-05 DIAGNOSIS — R000 Tachycardia, unspecified: Secondary | ICD-10-CM

## 2019-07-05 DIAGNOSIS — I10 Essential (primary) hypertension: Secondary | ICD-10-CM

## 2019-07-05 DIAGNOSIS — E039 Hypothyroidism, unspecified: Secondary | ICD-10-CM | POA: Diagnosis not present

## 2019-07-05 NOTE — Progress Notes (Signed)
Subjective:  Patient ID: [name], female    DOB: 10/11/1941  Age: 78 y.o. MRN: [id number]  CC: Hypertension and Hypothyroidism   HPI [name] presents for f/up - She is doing well on the current dose of carvedilol.  She has chronic, stable, intermittent episodes of palpitations.  She describes it about 2 times a month she wakes up in the morning and feels her heart beating rapidly.  This lasts a few seconds and then resolves.  She has had no recent episodes of low heart rate, low blood pressure, dizziness, or lightheadedness.  She has felt well recently and offers no complaints.  Outpatient Medications Prior to Visit  Medication Sig Dispense Refill   ALPRAZolam (XANAX) 0.25 MG tablet TAKE ONE TABLET BY MOUTH THREE TIMES DAILY AS NEEDED FOR SLEEP OR ANXIETY 180 tablet 0   aspirin 81 MG tablet Take 81 mg by mouth daily.      CALCIUM PO Take 1-2 capsules by mouth at bedtime.      carvedilol (COREG) 3.125 MG tablet Take 1 tablet (3.125 mg total) by mouth daily. TAKE 1 TABLET BY MOUTH TWICE DAILY WITH A MEAL 30 tablet 0   Cholecalciferol (VITAMIN D3) 5000 UNITS TABS Take 1 tablet by mouth daily.      Cyanocobalamin (VITAMIN B-12 CR) 1500 MCG TBCR Take 1 tablet by mouth 3 (three) times a week.      nitroGLYCERIN (NITROSTAT) 0.4 MG SL tablet Place 1 tablet (0.4 mg total) under the tongue every 5 (five) minutes as needed. 25 tablet 3   NONFORMULARY OR COMPOUNDED ITEM Triest (8-1-1) Progesterone/Testosterone (poly) (cinn) 1.25-200-0.375 mg troche daily. 30 each 5   NP THYROID 60 MG tablet TAKE 1 TABLET BY MOUTH ONCE DAILY BEFORE BREAKFAST 90 tablet 0   Nutritional Supplements (DHEA PO) Take 5 mg by mouth. 5 days per week     Omega-3 Fatty Acids (FISH OIL) 1000 MG CAPS Take 2 capsules by mouth daily.      pantoprazole (PROTONIX) 40 MG tablet Take 1 tablet (40 mg total) by mouth daily as needed (acid reflux). 90 tablet 1   Pitavastatin Calcium (LIVALO) 2 MG TABS Take 1  tablet (2 mg total) by mouth daily. 90 tablet 0   No facility-administered medications prior to visit.     ROS Review of Systems  Constitutional: Negative for chills, diaphoresis, fatigue and unexpected weight change.  HENT: Negative.   Eyes: Negative for visual disturbance.  Respiratory: Negative for cough, chest tightness, shortness of breath and wheezing.   Cardiovascular: Positive for palpitations. Negative for chest pain and leg swelling.  Gastrointestinal: Negative for abdominal pain, constipation, diarrhea, nausea and vomiting.  Endocrine: Negative.  Negative for cold intolerance and heat intolerance.  Genitourinary: Negative.   Musculoskeletal: Negative.  Negative for arthralgias and myalgias.  Skin: Negative.  Negative for color change.  Neurological: Negative.  Negative for dizziness, syncope, weakness and light-headedness.  Hematological: Negative for adenopathy. Does not bruise/bleed easily.  Psychiatric/Behavioral: Negative.     Objective:  BP 136/76 (BP Location: Left Arm, Patient Position: Sitting, Cuff Size: Normal)    Pulse 70    Temp 97.9 F (36.6 C) (Oral)    Resp 16    Ht 5' (1.524 m)    Wt 129 lb (58.5 kg)    SpO2 96%    BMI 25.19 kg/m   BP Readings from Last 3 Encounters:  07/05/19 136/76  06/06/19 136/76  11/08/18 128/70    Wt Readings from  Last 3 Encounters:  07/05/19 129 lb (58.5 kg)  06/06/19 126 lb 8 oz (57.4 kg)  11/08/18 126 lb (57.2 kg)    Physical Exam Vitals signs reviewed.  Constitutional:      Appearance: She is not ill-appearing or diaphoretic.  HENT:     Nose: Nose normal.     Mouth/Throat:     Mouth: Mucous membranes are moist.  Eyes:     General: No scleral icterus.    Conjunctiva/sclera: Conjunctivae normal.  Neck:     Musculoskeletal: Normal range of motion. No neck rigidity or muscular tenderness.  Cardiovascular:     Rate and Rhythm: Normal rate and regular rhythm.     Heart sounds: No murmur.  Pulmonary:     Effort:  Pulmonary effort is normal. No respiratory distress.     Breath sounds: No stridor. No wheezing, rhonchi or rales.  Abdominal:     General: Abdomen is flat. Bowel sounds are normal. There is no distension.     Palpations: There is no hepatomegaly or splenomegaly.     Tenderness: There is no abdominal tenderness.  Musculoskeletal: Normal range of motion.     Right lower leg: No edema.     Left lower leg: No edema.  Lymphadenopathy:     Cervical: No cervical adenopathy.  Skin:    General: Skin is warm and dry.  Neurological:     General: No focal deficit present.  Psychiatric:        Mood and Affect: Mood normal.        Behavior: Behavior normal.     Lab Results  Component Value Date   WBC 4.2 06/06/2019   HGB 14.6 06/06/2019   HCT 43.6 06/06/2019   PLT 227.0 06/06/2019   GLUCOSE 101 (H) 06/06/2019   CHOL 169 06/06/2019   TRIG 51.0 06/06/2019   HDL 68.20 06/06/2019   LDLDIRECT 130.0 08/03/2011   LDLCALC 91 06/06/2019   ALT 15 06/06/2019   AST 19 06/06/2019   NA 137 06/06/2019   K 4.4 06/06/2019   CL 103 06/06/2019   CREATININE 0.78 06/06/2019   BUN 12 06/06/2019   CO2 27 06/06/2019   TSH 1.41 06/06/2019   HGBA1C 5.5 06/06/2019    Mr Angiogram Neck W Wo Contrast  Result Date: 04/23/2018 CLINICAL DATA:  Left-sided facial paresthesias with tightness and numbness. Creatinine was obtained on site at [place] [facility] at [address] Results: Creatinine 1.2 mg/dL. EXAM: MR HEAD WITHOUT CONTRAST MR CIRCLE OF WILLIS WITHOUT CONTRAST MRA OF THE NECK WITHOUT AND WITH CONTRAST TECHNIQUE: Multiplanar, multiecho pulse sequences of the brain, circle of willis and surrounding structures were obtained without intravenous contrast. Angiographic images of the neck were obtained using MRA technique without and with intravenous contrast. CONTRAST:  62mL MULTIHANCE GADOBENATE DIMEGLUMINE 529 MG/ML IV SOLN COMPARISON:  None. FINDINGS: MR HEAD FINDINGS Brain: There is no evidence of  acute infarct, intracranial hemorrhage, mass, midline shift, or extra-axial fluid collection. There is mild generalized cerebral atrophy. Small foci of periventricular white matter T2 hyperintensity are nonspecific but compatible with mild chronic small vessel ischemic disease. Vascular: Major intracranial vascular flow voids are preserved. Skull and upper cervical spine: Unremarkable bone marrow signal. Sinuses/Orbits: Unremarkable orbits. Paranasal sinuses and mastoid air cells are clear. Other: None. MR CIRCLE OF WILLIS FINDINGS The visualized distal vertebral arteries are widely patent to the basilar. Patent left PICA, bilateral AICA, and bilateral SCA origins are identified. The basilar artery is widely patent. There is a  large right posterior communicating artery with a hypoplastic right P1 segment. No significant proximal PCA stenosis is identified allowing for mild motion artifact as well as predominant fetal supply the right PCA. The internal carotid arteries are patent from skull base to carotid termini without evidence of significant stenosis allowing for mild motion artifact. ACAs and MCAs are patent without evidence of proximal branch occlusion or significant stenosis. No aneurysm is identified. MRA NECK FINDINGS There is a standard 3 vessel aortic arch. The brachiocephalic and subclavian arteries are widely patent. The carotid arteries are patent without evidence of stenosis or dissection. The vertebral arteries are patent and codominant without evidence of stenosis or dissection. IMPRESSION: 1. No acute intracranial abnormality. 2. 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 3. Negative head MRA. 4. Negative neck MRA. Electronically Signed   By: Logan Bores M.D.   On: 04/23/2018 16:46   Mr Brain Wo Contrast  Result Date: 04/23/2018 CLINICAL DATA:  Left-sided facial paresthesias with tightness and numbness. Creatinine was obtained on site at [place] at [address] Results: Creatinine 1.2 mg/dL. EXAM: MR HEAD WITHOUT CONTRAST MR CIRCLE OF WILLIS WITHOUT CONTRAST MRA OF THE NECK WITHOUT AND WITH CONTRAST TECHNIQUE: Multiplanar, multiecho pulse sequences of the brain, circle of willis and surrounding structures were obtained without intravenous contrast. Angiographic images of the neck were obtained using MRA technique without and with intravenous contrast. CONTRAST:  84mL MULTIHANCE GADOBENATE DIMEGLUMINE 529 MG/ML IV SOLN COMPARISON:  None. FINDINGS: MR HEAD FINDINGS Brain: There is no evidence of acute infarct, intracranial hemorrhage, mass, midline shift, or extra-axial fluid collection. There is mild generalized cerebral atrophy. Small foci of periventricular white matter T2 hyperintensity are nonspecific but compatible with mild chronic small vessel ischemic disease. Vascular: Major intracranial vascular flow voids are preserved. Skull and upper cervical spine: Unremarkable bone marrow signal. Sinuses/Orbits: Unremarkable orbits. Paranasal sinuses and mastoid air cells are clear. Other: None. MR CIRCLE OF WILLIS FINDINGS The visualized distal vertebral arteries are widely patent to the basilar. Patent left PICA, bilateral AICA, and bilateral SCA origins are identified. The basilar artery is widely patent. There is a large right posterior communicating artery with a hypoplastic right P1 segment. No significant proximal PCA stenosis is identified allowing for mild motion artifact as well as predominant fetal supply the right PCA. The internal carotid arteries are patent from skull base to carotid termini without evidence of significant stenosis allowing for mild motion artifact. ACAs and MCAs are patent without evidence of proximal branch occlusion or significant stenosis. No aneurysm is identified. MRA NECK FINDINGS There is a standard 3 vessel aortic arch. The brachiocephalic and subclavian arteries are widely patent. The carotid arteries are patent without  evidence of stenosis or dissection. The vertebral arteries are patent and codominant without evidence of stenosis or dissection. IMPRESSION: 1. No acute intracranial abnormality. 2. 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 3. Negative head MRA. 4. Negative neck MRA. Electronically Signed   By: Logan Bores M.D.   On: 04/23/2018 16:46   Mr Jodene Nam Head Wo Contrast  Result Date: 04/23/2018 CLINICAL DATA:  Left-sided facial paresthesias with tightness and numbness. Creatinine was obtained on site at [place] at [address] Results: Creatinine 1.2 mg/dL. EXAM: MR HEAD WITHOUT CONTRAST MR CIRCLE OF WILLIS WITHOUT CONTRAST MRA OF THE NECK WITHOUT AND WITH CONTRAST TECHNIQUE: Multiplanar, multiecho pulse sequences of the brain, circle of willis and surrounding structures were obtained without intravenous  contrast. Angiographic images of the neck were obtained using MRA technique without and with intravenous contrast. CONTRAST:  38mL MULTIHANCE GADOBENATE DIMEGLUMINE 529 MG/ML IV SOLN COMPARISON:  None. FINDINGS: MR HEAD FINDINGS Brain: There is no evidence of acute infarct, intracranial hemorrhage, mass, midline shift, or extra-axial fluid collection. There is mild generalized cerebral atrophy. Small foci of periventricular white matter T2 hyperintensity are nonspecific but compatible with mild chronic small vessel ischemic disease. Vascular: Major intracranial vascular flow voids are preserved. Skull and upper cervical spine: Unremarkable bone marrow signal. Sinuses/Orbits: Unremarkable orbits. Paranasal sinuses and mastoid air cells are clear. Other: None. MR CIRCLE OF WILLIS FINDINGS The visualized distal vertebral arteries are widely patent to the basilar. Patent left PICA, bilateral AICA, and bilateral SCA origins are identified. The basilar artery is widely patent. There is a large right posterior communicating artery with a hypoplastic right P1 segment. No significant  proximal PCA stenosis is identified allowing for mild motion artifact as well as predominant fetal supply the right PCA. The internal carotid arteries are patent from skull base to carotid termini without evidence of significant stenosis allowing for mild motion artifact. ACAs and MCAs are patent without evidence of proximal branch occlusion or significant stenosis. No aneurysm is identified. MRA NECK FINDINGS There is a standard 3 vessel aortic arch. The brachiocephalic and subclavian arteries are widely patent. The carotid arteries are patent without evidence of stenosis or dissection. The vertebral arteries are patent and codominant without evidence of stenosis or dissection. IMPRESSION: 1. No acute intracranial abnormality. 2. 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 3. Negative head MRA. 4. Negative neck MRA. Electronically Signed   By: Logan Bores M.D.   On: 04/23/2018 16:46    Assessment & Plan:   [name] was seen today for hypertension and hypothyroidism.  Diagnoses and all orders for this visit:  Acquired hypothyroidism- Her TSH is in the normal range.  She will remain on the current dose of levothyroxine.  Tachycardia- She continues to have mild, chronic, asymptomatic palpitations.  She does not want to investigate this further with a cardiac event monitor.  Will continue the current dose of carvedilol.  Essential hypertension- Her blood pressure is adequately well controlled.   I am having [name]. [name] maintain her Vitamin B-12 CR, Fish Oil, Vitamin D3, CALCIUM PO, aspirin, nitroGLYCERIN, Nutritional Supplements (DHEA PO), pantoprazole, ALPRAZolam, Pitavastatin Calcium, NP Thyroid, NONFORMULARY OR COMPOUNDED ITEM, and carvedilol.  No orders of the defined types were placed in this encounter.    Follow-up: Return in about 6 months (around 01/05/2020).  Scarlette Calico, MD

## 2019-07-05 NOTE — Patient Instructions (Signed)

## 2019-07-09 DIAGNOSIS — I10 Essential (primary) hypertension: Secondary | ICD-10-CM | POA: Insufficient documentation

## 2019-07-13 ENCOUNTER — Telehealth: Payer: Self-pay | Admitting: Internal Medicine

## 2019-07-13 NOTE — Telephone Encounter (Signed)
Called patient to schedule AWV, but no answer. Will try to contact patient again. SF

## 2019-07-16 ENCOUNTER — Other Ambulatory Visit: Payer: Self-pay | Admitting: Internal Medicine

## 2019-07-16 DIAGNOSIS — R000 Tachycardia, unspecified: Secondary | ICD-10-CM

## 2019-07-16 DIAGNOSIS — F410 Panic disorder [episodic paroxysmal anxiety] without agoraphobia: Secondary | ICD-10-CM

## 2019-07-16 DIAGNOSIS — E039 Hypothyroidism, unspecified: Secondary | ICD-10-CM

## 2019-07-16 DIAGNOSIS — E785 Hyperlipidemia, unspecified: Secondary | ICD-10-CM

## 2019-07-17 DIAGNOSIS — H04123 Dry eye syndrome of bilateral lacrimal glands: Secondary | ICD-10-CM | POA: Diagnosis not present

## 2019-07-17 DIAGNOSIS — H0102A Squamous blepharitis right eye, upper and lower eyelids: Secondary | ICD-10-CM | POA: Diagnosis not present

## 2019-07-17 DIAGNOSIS — H16142 Punctate keratitis, left eye: Secondary | ICD-10-CM | POA: Diagnosis not present

## 2019-07-17 DIAGNOSIS — H2513 Age-related nuclear cataract, bilateral: Secondary | ICD-10-CM | POA: Diagnosis not present

## 2019-07-17 DIAGNOSIS — H0015 Chalazion left lower eyelid: Secondary | ICD-10-CM | POA: Diagnosis not present

## 2019-07-18 ENCOUNTER — Other Ambulatory Visit: Payer: Self-pay | Admitting: Internal Medicine

## 2019-07-18 DIAGNOSIS — E039 Hypothyroidism, unspecified: Secondary | ICD-10-CM

## 2019-07-18 DIAGNOSIS — E785 Hyperlipidemia, unspecified: Secondary | ICD-10-CM

## 2019-07-19 MED ORDER — LIVALO 2 MG PO TABS: 1.0000 | ORAL_TABLET | Freq: Every day | ORAL | 1 refills | Status: DC

## 2019-07-20 MED ORDER — ALPRAZOLAM 0.25 MG PO TABS: ORAL_TABLET | ORAL | 1 refills | Status: DC

## 2019-07-20 MED ORDER — THYROID 60 MG PO TABS: 60.0000 mg | ORAL_TABLET | Freq: Every day | ORAL | 1 refills | Status: DC

## 2019-07-20 MED ORDER — CARVEDILOL 3.125 MG PO TABS: 3.1250 mg | ORAL_TABLET | Freq: Every day | ORAL | 1 refills | Status: DC

## 2019-08-20 ENCOUNTER — Other Ambulatory Visit (INDEPENDENT_AMBULATORY_CARE_PROVIDER_SITE_OTHER): Payer: Medicare Other

## 2019-08-20 ENCOUNTER — Encounter: Payer: Self-pay | Admitting: Internal Medicine

## 2019-08-20 ENCOUNTER — Other Ambulatory Visit: Payer: Self-pay

## 2019-08-20 ENCOUNTER — Ambulatory Visit (INDEPENDENT_AMBULATORY_CARE_PROVIDER_SITE_OTHER)
Admission: RE | Admit: 2019-08-20 | Discharge: 2019-08-20 | Disposition: A | Discharge status: Home / Self Care | Payer: Medicare Other | Source: Ambulatory Visit | Attending: Internal Medicine | Admitting: Internal Medicine

## 2019-08-20 ENCOUNTER — Ambulatory Visit (INDEPENDENT_AMBULATORY_CARE_PROVIDER_SITE_OTHER): Payer: Medicare Other | Admitting: Internal Medicine

## 2019-08-20 VITALS — BP 140/90 | HR 83 | Temp 98.1°F | Resp 16 | Ht 60.0 in | Wt 128.0 lb

## 2019-08-20 DIAGNOSIS — M546 Pain in thoracic spine: Secondary | ICD-10-CM | POA: Diagnosis not present

## 2019-08-20 DIAGNOSIS — M5134 Other intervertebral disc degeneration, thoracic region: Secondary | ICD-10-CM | POA: Insufficient documentation

## 2019-08-20 DIAGNOSIS — R072 Precordial pain: Secondary | ICD-10-CM | POA: Diagnosis not present

## 2019-08-20 DIAGNOSIS — M47814 Spondylosis without myelopathy or radiculopathy, thoracic region: Secondary | ICD-10-CM | POA: Diagnosis not present

## 2019-08-20 DIAGNOSIS — R079 Chest pain, unspecified: Secondary | ICD-10-CM | POA: Diagnosis not present

## 2019-08-20 LAB — CBC WITH DIFFERENTIAL/PLATELET
Basophils Absolute: 0 10*3/uL (ref 0.0–0.1)
Basophils Relative: 0.7 % (ref 0.0–3.0)
Eosinophils Absolute: 0.1 10*3/uL (ref 0.0–0.7)
Eosinophils Relative: 1.3 % (ref 0.0–5.0)
HCT: 41.9 % (ref 36.0–46.0)
Hemoglobin: 14.1 g/dL (ref 12.0–15.0)
Lymphocytes Relative: 40.2 % (ref 12.0–46.0)
Lymphs Abs: 1.9 10*3/uL (ref 0.7–4.0)
MCHC: 33.7 g/dL (ref 30.0–36.0)
MCV: 93.7 fl (ref 78.0–100.0)
Monocytes Absolute: 0.4 10*3/uL (ref 0.1–1.0)
Monocytes Relative: 9 % (ref 3.0–12.0)
Neutro Abs: 2.3 10*3/uL (ref 1.4–7.7)
Neutrophils Relative %: 48.8 % (ref 43.0–77.0)
Platelets: 211 10*3/uL (ref 150.0–400.0)
RBC: 4.47 Mil/uL (ref 3.87–5.11)
RDW: 13.3 % (ref 11.5–15.5)
WBC: 4.7 10*3/uL (ref 4.0–10.5)

## 2019-08-20 LAB — TROPONIN I (HIGH SENSITIVITY): High Sens Troponin I: 3 ng/L (ref 2–17)

## 2019-08-20 MED ORDER — MELOXICAM 7.5 MG PO TABS: 7.5000 mg | ORAL_TABLET | Freq: Every day | ORAL | 1 refills | Status: DC

## 2019-08-20 NOTE — Progress Notes (Signed)
Subjective:  Patient ID: [name], female    DOB: 1940/11/27  Age: 78 y.o. MRN: [id number]  CC: Back Pain and Chest Pain   HPI [name] presents for f/up - She complains of a one-month history of nontraumatic pain in her thoracic spine.  She describes it as a dull ache and soreness that worsens with movement.  She has gotten minimal symptom relief with Motrin.  She also complains of left chest wall pain that she describes as heaviness.  The chest pain occurs at rest and is not exacerbated by exertion.  She denies cough, hemoptysis, diaphoresis, dizziness, or lightheadedness.  Outpatient Medications Prior to Visit  Medication Sig Dispense Refill   ALPRAZolam (XANAX) 0.25 MG tablet TAKE ONE TABLET BY MOUTH THREE TIMES DAILY AS NEEDED FOR SLEEP OR ANXIETY 180 tablet 1   aspirin 81 MG tablet Take 81 mg by mouth daily.      CALCIUM PO Take 1-2 capsules by mouth at bedtime.      carvedilol (COREG) 3.125 MG tablet Take 1 tablet (3.125 mg total) by mouth daily. TAKE 1 TABLET BY MOUTH TWICE DAILY WITH A MEAL 90 tablet 1   Cholecalciferol (VITAMIN D3) 5000 UNITS TABS Take 1 tablet by mouth daily.      Cyanocobalamin (VITAMIN B-12 CR) 1500 MCG TBCR Take 1 tablet by mouth 3 (three) times a week.      LIVALO 2 MG TABS Take 1 tablet by mouth once daily 90 tablet 1   nitroGLYCERIN (NITROSTAT) 0.4 MG SL tablet Place 1 tablet (0.4 mg total) under the tongue every 5 (five) minutes as needed. 25 tablet 3   NONFORMULARY OR COMPOUNDED ITEM Triest (8-1-1) Progesterone/Testosterone (poly) (cinn) 1.25-200-0.375 mg troche daily. 30 each 5   NP THYROID 60 MG tablet TAKE 1 TABLET BY MOUTH ONCE DAILY BEFORE BREAKFAST 90 tablet 1   Nutritional Supplements (DHEA PO) Take 5 mg by mouth. 5 days per week     Omega-3 Fatty Acids (FISH OIL) 1000 MG CAPS Take 2 capsules by mouth daily.      pantoprazole (PROTONIX) 40 MG tablet Take 1 tablet (40 mg total) by mouth daily as needed (acid reflux). 90  tablet 1   Pitavastatin Calcium (LIVALO) 2 MG TABS Take 1 tablet (2 mg total) by mouth daily. 90 tablet 1   thyroid (NP THYROID) 60 MG tablet Take 1 tablet (60 mg total) by mouth daily before breakfast. 90 tablet 1   No facility-administered medications prior to visit.     ROS Review of Systems  Constitutional: Negative for diaphoresis, fatigue and unexpected weight change.  HENT: Negative.   Eyes: Negative for visual disturbance.  Respiratory: Negative for cough, chest tightness, shortness of breath and wheezing.   Cardiovascular: Positive for chest pain. Negative for palpitations and leg swelling.  Gastrointestinal: Negative for abdominal pain, constipation and diarrhea.  Genitourinary: Negative.  Negative for difficulty urinating.  Musculoskeletal: Positive for back pain. Negative for arthralgias and myalgias.  Skin: Negative.   Neurological: Negative.  Negative for dizziness and light-headedness.  Hematological: Negative for adenopathy. Does not bruise/bleed easily.  Psychiatric/Behavioral: Negative.     Objective:  BP 140/90 (BP Location: Left Arm, Patient Position: Sitting, Cuff Size: Normal)    Pulse 83    Temp 98.1 F (36.7 C) (Oral)    Resp 16    Ht 5' (1.524 m)    Wt 128 lb (58.1 kg)    SpO2 97%    BMI 25.00 kg/m  BP Readings from Last 3 Encounters:  08/20/19 140/90  07/05/19 136/76  06/06/19 136/76    Wt Readings from Last 3 Encounters:  08/20/19 128 lb (58.1 kg)  07/05/19 129 lb (58.5 kg)  06/06/19 126 lb 8 oz (57.4 kg)    Physical Exam Vitals signs reviewed.  Constitutional:      Appearance: Normal appearance. She is well-developed.  HENT:     Nose: Nose normal.     Mouth/Throat:     Mouth: Mucous membranes are moist.  Eyes:     General: No scleral icterus.    Conjunctiva/sclera: Conjunctivae normal.  Neck:     Musculoskeletal: Normal range of motion and neck supple.  Cardiovascular:     Rate and Rhythm: Normal rate and regular rhythm.     Heart  sounds: No friction rub.  Pulmonary:     Effort: Pulmonary effort is normal.     Breath sounds: No stridor. No wheezing, rhonchi or rales.  Chest:     Chest wall: No mass, deformity, swelling, tenderness or edema.  Abdominal:     General: Abdomen is flat. Bowel sounds are normal. There is no distension.     Palpations: There is no hepatomegaly or splenomegaly.  Musculoskeletal: Normal range of motion.     Thoracic back: Normal. She exhibits normal range of motion, no tenderness, no swelling, no edema and no deformity.     Right lower leg: No edema.     Left lower leg: No edema.  Lymphadenopathy:     Cervical: No cervical adenopathy.  Skin:    General: Skin is warm and dry.     Findings: No rash.  Neurological:     General: No focal deficit present.     Mental Status: She is alert.  Psychiatric:        Mood and Affect: Mood normal.        Behavior: Behavior normal.     Lab Results  Component Value Date   WBC 4.7 08/20/2019   HGB 14.1 08/20/2019   HCT 41.9 08/20/2019   PLT 211.0 08/20/2019   GLUCOSE 101 (H) 06/06/2019   CHOL 169 06/06/2019   TRIG 51.0 06/06/2019   HDL 68.20 06/06/2019   LDLDIRECT 130.0 08/03/2011   LDLCALC 91 06/06/2019   ALT 15 06/06/2019   AST 19 06/06/2019   NA 137 06/06/2019   K 4.4 06/06/2019   CL 103 06/06/2019   CREATININE 0.78 06/06/2019   BUN 12 06/06/2019   CO2 27 06/06/2019   TSH 1.41 06/06/2019   HGBA1C 5.5 06/06/2019    Mr Angiogram Neck W Wo Contrast  Result Date: 04/23/2018 CLINICAL DATA:  Left-sided facial paresthesias with tightness and numbness. Creatinine was obtained on site at [facility] at [address] Results: Creatinine 1.2 mg/dL. EXAM: MR HEAD WITHOUT CONTRAST MR CIRCLE OF WILLIS WITHOUT CONTRAST MRA OF THE NECK WITHOUT AND WITH CONTRAST TECHNIQUE: Multiplanar, multiecho pulse sequences of the brain, circle of willis and surrounding structures were obtained without intravenous contrast. Angiographic images of  the neck were obtained using MRA technique without and with intravenous contrast. CONTRAST:  15mL MULTIHANCE GADOBENATE DIMEGLUMINE 529 MG/ML IV SOLN COMPARISON:  None. FINDINGS: MR HEAD FINDINGS Brain: There is no evidence of acute infarct, intracranial hemorrhage, mass, midline shift, or extra-axial fluid collection. There is mild generalized cerebral atrophy. Small foci of periventricular white matter T2 hyperintensity are nonspecific but compatible with mild chronic small vessel ischemic disease. Vascular: Major intracranial vascular flow voids are preserved.  Skull and upper cervical spine: Unremarkable bone marrow signal. Sinuses/Orbits: Unremarkable orbits. Paranasal sinuses and mastoid air cells are clear. Other: None. MR CIRCLE OF WILLIS FINDINGS The visualized distal vertebral arteries are widely patent to the basilar. Patent left PICA, bilateral AICA, and bilateral SCA origins are identified. The basilar artery is widely patent. There is a large right posterior communicating artery with a hypoplastic right P1 segment. No significant proximal PCA stenosis is identified allowing for mild motion artifact as well as predominant fetal supply the right PCA. The internal carotid arteries are patent from skull base to carotid termini without evidence of significant stenosis allowing for mild motion artifact. ACAs and MCAs are patent without evidence of proximal branch occlusion or significant stenosis. No aneurysm is identified. MRA NECK FINDINGS There is a standard 3 vessel aortic arch. The brachiocephalic and subclavian arteries are widely patent. The carotid arteries are patent without evidence of stenosis or dissection. The vertebral arteries are patent and codominant without evidence of stenosis or dissection. IMPRESSION: 1. No acute intracranial abnormality. 2. 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 3. Negative head MRA. 4. Negative neck MRA. Electronically Signed   By: Logan Bores M.D.   On: 04/23/2018 16:46   Mr Brain Wo Contrast  Result Date: 04/23/2018 CLINICAL DATA:  Left-sided facial paresthesias with tightness and numbness. Creatinine was obtained on site at [place] [facility] at [address] Results: Creatinine 1.2 mg/dL. EXAM: MR HEAD WITHOUT CONTRAST MR CIRCLE OF WILLIS WITHOUT CONTRAST MRA OF THE NECK WITHOUT AND WITH CONTRAST TECHNIQUE: Multiplanar, multiecho pulse sequences of the brain, circle of willis and surrounding structures were obtained without intravenous contrast. Angiographic images of the neck were obtained using MRA technique without and with intravenous contrast. CONTRAST:  67mL MULTIHANCE GADOBENATE DIMEGLUMINE 529 MG/ML IV SOLN COMPARISON:  None. FINDINGS: MR HEAD FINDINGS Brain: There is no evidence of acute infarct, intracranial hemorrhage, mass, midline shift, or extra-axial fluid collection. There is mild generalized cerebral atrophy. Small foci of periventricular white matter T2 hyperintensity are nonspecific but compatible with mild chronic small vessel ischemic disease. Vascular: Major intracranial vascular flow voids are preserved. Skull and upper cervical spine: Unremarkable bone marrow signal. Sinuses/Orbits: Unremarkable orbits. Paranasal sinuses and mastoid air cells are clear. Other: None. MR CIRCLE OF WILLIS FINDINGS The visualized distal vertebral arteries are widely patent to the basilar. Patent left PICA, bilateral AICA, and bilateral SCA origins are identified. The basilar artery is widely patent. There is a large right posterior communicating artery with a hypoplastic right P1 segment. No significant proximal PCA stenosis is identified allowing for mild motion artifact as well as predominant fetal supply the right PCA. The internal carotid arteries are patent from skull base to carotid termini without evidence of significant stenosis allowing for mild motion artifact. ACAs and MCAs are patent without evidence of proximal branch  occlusion or significant stenosis. No aneurysm is identified. MRA NECK FINDINGS There is a standard 3 vessel aortic arch. The brachiocephalic and subclavian arteries are widely patent. The carotid arteries are patent without evidence of stenosis or dissection. The vertebral arteries are patent and codominant without evidence of stenosis or dissection. IMPRESSION: 1. No acute intracranial abnormality. 2. 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 3. Negative head MRA. 4. Negative neck MRA. Electronically Signed   By: Logan Bores M.D.   On: 04/23/2018 16:46   Mr Jodene Nam Head Wo Contrast  Result Date: 04/23/2018 CLINICAL DATA:  Left-sided facial paresthesias  with tightness and numbness. Creatinine was obtained on site at [facility] at [address] Results: Creatinine 1.2 mg/dL. EXAM: MR HEAD WITHOUT CONTRAST MR CIRCLE OF WILLIS WITHOUT CONTRAST MRA OF THE NECK WITHOUT AND WITH CONTRAST TECHNIQUE: Multiplanar, multiecho pulse sequences of the brain, circle of willis and surrounding structures were obtained without intravenous contrast. Angiographic images of the neck were obtained using MRA technique without and with intravenous contrast. CONTRAST:  11mL MULTIHANCE GADOBENATE DIMEGLUMINE 529 MG/ML IV SOLN COMPARISON:  None. FINDINGS: MR HEAD FINDINGS Brain: There is no evidence of acute infarct, intracranial hemorrhage, mass, midline shift, or extra-axial fluid collection. There is mild generalized cerebral atrophy. Small foci of periventricular white matter T2 hyperintensity are nonspecific but compatible with mild chronic small vessel ischemic disease. Vascular: Major intracranial vascular flow voids are preserved. Skull and upper cervical spine: Unremarkable bone marrow signal. Sinuses/Orbits: Unremarkable orbits. Paranasal sinuses and mastoid air cells are clear. Other: None. MR CIRCLE OF WILLIS FINDINGS The visualized distal vertebral arteries are widely patent to the  basilar. Patent left PICA, bilateral AICA, and bilateral SCA origins are identified. The basilar artery is widely patent. There is a large right posterior communicating artery with a hypoplastic right P1 segment. No significant proximal PCA stenosis is identified allowing for mild motion artifact as well as predominant fetal supply the right PCA. The internal carotid arteries are patent from skull base to carotid termini without evidence of significant stenosis allowing for mild motion artifact. ACAs and MCAs are patent without evidence of proximal branch occlusion or significant stenosis. No aneurysm is identified. MRA NECK FINDINGS There is a standard 3 vessel aortic arch. The brachiocephalic and subclavian arteries are widely patent. The carotid arteries are patent without evidence of stenosis or dissection. The vertebral arteries are patent and codominant without evidence of stenosis or dissection. IMPRESSION: 1. No acute intracranial abnormality. 2. 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 3. Negative head MRA. 4. Negative neck MRA. Electronically Signed   By: Logan Bores M.D.   On: 04/23/2018 16:46    Dg Chest 2 View  Result Date: 08/20/2019 CLINICAL DATA:  Thoracic spine pain radiating into the chest for 3 weeks. EXAM: CHEST - 2 VIEW COMPARISON:  PA and lateral chest 04/05/2014. FINDINGS: Lungs are clear. Heart size is normal. No pneumothorax or pleural fluid. No acute or focal bony abnormality. Multilevel anterior endplate spurring appears unchanged. IMPRESSION: No acute disease. 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 Electronically Signed   By: Inge Rise M.D.   On: 08/20/2019 14:07   Dg Thoracic Spine W/swimmers  Result Date: 08/20/2019 CLINICAL DATA:  Chest pain, no known injury, initial encounter EXAM: THORACIC SPINE - 3 VIEWS COMPARISON:  04/15/2011 FINDINGS: Vertebral body height is well maintained. Multilevel osteophytic changes are seen. No  acute compression deformity is noted. No paraspinal mass is seen. IMPRESSION: Degenerative change without acute abnormality. Electronically Signed   By: Inez Catalina M.D.   On: 08/20/2019 14:15    Assessment & Plan:   Onnika was seen today for back pain and chest pain.  Diagnoses and all orders for this visit:  Precordial pain- She has chest pain at rest with a normal EKG and a negative troponin.  This is reassuring that she is not experiencing ischemia.  Her chest x-ray is also reassuring.  I think this is musculoskeletal pain and have asked her to start taking meloxicam. -     DG Chest 2 View; Future -  EKG 12-Lead -     CBC with Differential/Platelet; Future -     Troponin I (High Sensitivity); Future  Acute bilateral thoracic back pain- Plain films are negative for fracture but positive for degenerative changes.  I have asked her to upgrade to a once a day prescription strength anti-inflammatory. -     DG Thoracic Spine W/Swimmers; Future  DDD (degenerative disc disease), thoracic -     meloxicam (MOBIC) 7.5 MG tablet; Take 1 tablet (7.5 mg total) by mouth daily.   I am having Zena Amos. Neuharth start on meloxicam. I am also having her maintain her Vitamin B-12 CR, Fish Oil, Vitamin D3, CALCIUM PO, aspirin, nitroGLYCERIN, Nutritional Supplements (DHEA PO), pantoprazole, NONFORMULARY OR COMPOUNDED ITEM, ALPRAZolam, thyroid, carvedilol, Livalo, NP Thyroid, and Livalo.  Meds ordered this encounter  Medications   meloxicam (MOBIC) 7.5 MG tablet    Sig: Take 1 tablet (7.5 mg total) by mouth daily.    Dispense:  90 tablet    Refill:  1     Follow-up: Return in about 3 weeks (around 09/10/2019).  Scarlette Calico, MD

## 2019-08-20 NOTE — Patient Instructions (Signed)

## 2019-08-29 DIAGNOSIS — H16142 Punctate keratitis, left eye: Secondary | ICD-10-CM | POA: Diagnosis not present

## 2019-08-29 DIAGNOSIS — H2513 Age-related nuclear cataract, bilateral: Secondary | ICD-10-CM | POA: Diagnosis not present

## 2019-08-29 DIAGNOSIS — H04123 Dry eye syndrome of bilateral lacrimal glands: Secondary | ICD-10-CM | POA: Diagnosis not present

## 2019-08-29 DIAGNOSIS — H0102A Squamous blepharitis right eye, upper and lower eyelids: Secondary | ICD-10-CM | POA: Diagnosis not present

## 2019-08-29 DIAGNOSIS — H0015 Chalazion left lower eyelid: Secondary | ICD-10-CM | POA: Diagnosis not present

## 2019-11-26 DIAGNOSIS — Z1231 Encounter for screening mammogram for malignant neoplasm of breast: Secondary | ICD-10-CM | POA: Diagnosis not present

## 2019-11-26 DIAGNOSIS — Z803 Family history of malignant neoplasm of breast: Secondary | ICD-10-CM | POA: Diagnosis not present

## 2020-01-14 ENCOUNTER — Ambulatory Visit (INDEPENDENT_AMBULATORY_CARE_PROVIDER_SITE_OTHER): Payer: Medicare Other | Admitting: Internal Medicine

## 2020-01-14 ENCOUNTER — Encounter: Payer: Self-pay | Admitting: Internal Medicine

## 2020-01-14 ENCOUNTER — Other Ambulatory Visit: Payer: Self-pay

## 2020-01-14 VITALS — BP 126/78 | HR 72 | Temp 98.2°F | Resp 16 | Ht 60.0 in | Wt 128.0 lb

## 2020-01-14 DIAGNOSIS — E785 Hyperlipidemia, unspecified: Secondary | ICD-10-CM

## 2020-01-14 DIAGNOSIS — E039 Hypothyroidism, unspecified: Secondary | ICD-10-CM | POA: Diagnosis not present

## 2020-01-14 DIAGNOSIS — I10 Essential (primary) hypertension: Secondary | ICD-10-CM

## 2020-01-14 LAB — BASIC METABOLIC PANEL
BUN: 6 mg/dL (ref 6–23)
CO2: 26 mEq/L (ref 19–32)
Calcium: 9.3 mg/dL (ref 8.4–10.5)
Chloride: 105 mEq/L (ref 96–112)
Creatinine, Ser: 0.73 mg/dL (ref 0.40–1.20)
GFR: 77.1 mL/min (ref >60.00)
Glucose, Bld: 85 mg/dL (ref 70–99)
Potassium: 4.1 mEq/L (ref 3.5–5.1)
Sodium: 139 mEq/L (ref 135–145)

## 2020-01-14 LAB — LIPID PANEL
Cholesterol: 147 mg/dL (ref 0–200)
HDL: 65.1 mg/dL (ref >39.00)
LDL Cholesterol: 66 mg/dL (ref 0–99)
NonHDL: 81.98
Total CHOL/HDL Ratio: 2
Triglycerides: 81 mg/dL (ref 0.0–149.0)
VLDL: 16.2 mg/dL (ref 0.0–40.0)

## 2020-01-14 LAB — TSH: TSH: 0.52 u[IU]/mL (ref 0.35–4.50)

## 2020-01-14 MED ORDER — THYROID 60 MG PO TABS: 60.0000 mg | ORAL_TABLET | Freq: Every day | ORAL | 1 refills | Status: DC

## 2020-01-14 MED ORDER — LIVALO 2 MG PO TABS: 1.0000 | ORAL_TABLET | Freq: Every day | ORAL | 1 refills | Status: DC

## 2020-01-14 NOTE — Patient Instructions (Signed)
Hypothyroidism  Hypothyroidism is when the thyroid gland does not make enough of certain hormones (it is underactive). The thyroid gland is a small gland located in the lower front part of the neck, just in front of the windpipe (trachea). This gland makes hormones that help control how the body uses food for energy (metabolism) as well as how the heart and brain function. These hormones also play a role in keeping your bones strong. When the thyroid is underactive, it produces too little of the hormones thyroxine (T4) and triiodothyronine (T3). What are the causes? This condition may be caused by:  Hashimoto's disease. This is a disease in which the body's disease-fighting system (immune system) attacks the thyroid gland. This is the most common cause.  Viral infections.  Pregnancy.  Certain medicines.  Birth defects.  Past radiation treatments to the head or neck for cancer.  Past treatment with radioactive iodine.  Past exposure to radiation in the environment.  Past surgical removal of part or all of the thyroid.  Problems with a gland in the center of the brain (pituitary gland).  Lack of enough iodine in the diet. What increases the risk? You are more likely to develop this condition if:  You are female.  You have a family history of thyroid conditions.  You use a medicine called lithium.  You take medicines that affect the immune system (immunosuppressants). What are the signs or symptoms? Symptoms of this condition include:  Feeling as though you have no energy (lethargy).  Not being able to tolerate cold.  Weight gain that is not explained by a change in diet or exercise habits.  Lack of appetite.  Dry skin.  Coarse hair.  Menstrual irregularity.  Slowing of thought processes.  Constipation.  Sadness or depression. How is this diagnosed? This condition may be diagnosed based on:  Your symptoms, your medical history, and a physical exam.  Blood  tests. You may also have imaging tests, such as an ultrasound or MRI. How is this treated? This condition is treated with medicine that replaces the thyroid hormones that your body does not make. After you begin treatment, it may take several weeks for symptoms to go away. Follow these instructions at home:  Take over-the-counter and prescription medicines only as told by your health care provider.  If you start taking any new medicines, tell your health care provider.  Keep all follow-up visits as told by your health care provider. This is important. ? As your condition improves, your dosage of thyroid hormone medicine may change. ? You will need to have blood tests regularly so that your health care provider can monitor your condition. Contact a health care provider if:  Your symptoms do not get better with treatment.  You are taking thyroid replacement medicine and you: ? Sweat a lot. ? Have tremors. ? Feel anxious. ? Lose weight rapidly. ? Cannot tolerate heat. ? Have emotional swings. ? Have diarrhea. ? Feel weak. Get help right away if you have:  Chest pain.  An irregular heartbeat.  A rapid heartbeat.  Difficulty breathing. Summary  Hypothyroidism is when the thyroid gland does not make enough of certain hormones (it is underactive).  When the thyroid is underactive, it produces too little of the hormones thyroxine (T4) and triiodothyronine (T3).  The most common cause is Hashimoto's disease, a disease in which the body's disease-fighting system (immune system) attacks the thyroid gland. The condition can also be caused by viral infections, medicine, pregnancy, or past   radiation treatment to the head or neck.  Symptoms may include weight gain, dry skin, constipation, feeling as though you do not have energy, and not being able to tolerate cold.  This condition is treated with medicine to replace the thyroid hormones that your body does not make. This information  is not intended to replace advice given to you by your health care provider. Make sure you discuss any questions you have with your health care provider. Document Revised: 10/21/2017 Document Reviewed: 10/19/2017 [employer] Patient Education  2020 [employer]  

## 2020-01-14 NOTE — Progress Notes (Signed)
Subjective:  Patient ID: [name], female    DOB: 1941/09/15  Age: 79 y.o. MRN: [id number]  CC: Hypothyroidism and Hyperlipidemia  This visit occurred during the SARS-CoV-2 public health emergency.  Safety protocols were in place, including screening questions prior to the visit, additional usage of staff PPE, and extensive cleaning of exam room while observing appropriate contact time as indicated for disinfecting solutions.    HPI [name] presents for f/up - She has felt well recently and offers no complaints.  She has had no recent episodes of palpitations, dizziness, lightheadedness, chest pain, shortness of breath, or dyspnea on exertion.  Outpatient Medications Prior to Visit  Medication Sig Dispense Refill   ALPRAZolam (XANAX) 0.25 MG tablet TAKE ONE TABLET BY MOUTH THREE TIMES DAILY AS NEEDED FOR SLEEP OR ANXIETY 180 tablet 1   aspirin 81 MG tablet Take 81 mg by mouth daily.      CALCIUM PO Take 1-2 capsules by mouth at bedtime.      carvedilol (COREG) 3.125 MG tablet Take 1 tablet (3.125 mg total) by mouth daily. TAKE 1 TABLET BY MOUTH TWICE DAILY WITH A MEAL 90 tablet 1   Cholecalciferol (VITAMIN D3) 5000 UNITS TABS Take 1 tablet by mouth daily.      Cyanocobalamin (VITAMIN B-12 CR) 1500 MCG TBCR Take 1 tablet by mouth 3 (three) times a week.      FLUZONE HIGH-DOSE QUADRIVALENT 0.7 ML SUSY      meloxicam (MOBIC) 7.5 MG tablet Take 1 tablet (7.5 mg total) by mouth daily. 90 tablet 1   nitroGLYCERIN (NITROSTAT) 0.4 MG SL tablet Place 1 tablet (0.4 mg total) under the tongue every 5 (five) minutes as needed. 25 tablet 3   NONFORMULARY OR COMPOUNDED ITEM Triest (8-1-1) Progesterone/Testosterone (poly) (cinn) 1.25-200-0.375 mg troche daily. 30 each 5   Nutritional Supplements (DHEA PO) Take 5 mg by mouth. 5 days per week     Omega-3 Fatty Acids (FISH OIL) 1000 MG CAPS Take 2 capsules by mouth daily.      pantoprazole (PROTONIX) 40 MG tablet Take 1 tablet  (40 mg total) by mouth daily as needed (acid reflux). 90 tablet 1   LIVALO 2 MG TABS Take 1 tablet by mouth once daily 90 tablet 1   NP THYROID 60 MG tablet TAKE 1 TABLET BY MOUTH ONCE DAILY BEFORE BREAKFAST 90 tablet 1   Pitavastatin Calcium (LIVALO) 2 MG TABS Take 1 tablet (2 mg total) by mouth daily. 90 tablet 1   thyroid (NP THYROID) 60 MG tablet Take 1 tablet (60 mg total) by mouth daily before breakfast. 90 tablet 1   No facility-administered medications prior to visit.    ROS Review of Systems  Constitutional: Negative for appetite change, diaphoresis, fatigue and unexpected weight change.  HENT: Negative.   Eyes: Negative.   Respiratory: Negative for cough, chest tightness, shortness of breath and wheezing.   Cardiovascular: Negative for chest pain, palpitations and leg swelling.  Endocrine: Negative.  Negative for cold intolerance and heat intolerance.  Genitourinary: Negative.  Negative for difficulty urinating.  Musculoskeletal: Negative.  Negative for arthralgias.  Skin: Negative.  Negative for color change and rash.  Neurological: Negative.  Negative for dizziness, weakness, light-headedness and numbness.  Hematological: Negative for adenopathy. Does not bruise/bleed easily.  Psychiatric/Behavioral: Negative.  The patient is not nervous/anxious.     Objective:  BP 126/78 (BP Location: Left Arm, Patient Position: Sitting, Cuff Size: Normal)    Pulse 72  Temp 98.2 F (36.8 C) (Oral)    Resp 16    Ht 5' (1.524 m)    Wt 128 lb (58.1 kg)    SpO2 98%    BMI 25.00 kg/m   BP Readings from Last 3 Encounters:  01/14/20 126/78  08/20/19 140/90  07/05/19 136/76    Wt Readings from Last 3 Encounters:  01/14/20 128 lb (58.1 kg)  08/20/19 128 lb (58.1 kg)  07/05/19 129 lb (58.5 kg)    Physical Exam Vitals reviewed.  Constitutional:      Appearance: Normal appearance.  HENT:     Nose: Nose normal.     Mouth/Throat:     Mouth: Mucous membranes are moist.  Eyes:      Conjunctiva/sclera: Conjunctivae normal.  Cardiovascular:     Rate and Rhythm: Normal rate and regular rhythm.     Heart sounds: No murmur.  Pulmonary:     Effort: Pulmonary effort is normal.     Breath sounds: No stridor. No wheezing, rhonchi or rales.  Abdominal:     General: Abdomen is flat. Bowel sounds are normal. There is no distension.     Palpations: Abdomen is soft. There is no hepatomegaly or splenomegaly.     Tenderness: There is no abdominal tenderness.  Musculoskeletal:        General: Normal range of motion.     Cervical back: Neck supple.     Right lower leg: No edema.     Left lower leg: No edema.  Lymphadenopathy:     Cervical: No cervical adenopathy.  Skin:    General: Skin is warm and dry.     Coloration: Skin is not pale.  Neurological:     General: No focal deficit present.     Mental Status: She is alert.  Psychiatric:        Mood and Affect: Mood normal.        Behavior: Behavior normal.     Lab Results  Component Value Date   WBC 4.7 08/20/2019   HGB 14.1 08/20/2019   HCT 41.9 08/20/2019   PLT 211.0 08/20/2019   GLUCOSE 85 01/14/2020   CHOL 147 01/14/2020   TRIG 81.0 01/14/2020   HDL 65.10 01/14/2020   LDLDIRECT 130.0 08/03/2011   LDLCALC 66 01/14/2020   ALT 15 06/06/2019   AST 19 06/06/2019   NA 139 01/14/2020   K 4.1 01/14/2020   CL 105 01/14/2020   CREATININE 0.73 01/14/2020   BUN 6 01/14/2020   CO2 26 01/14/2020   TSH 0.52 01/14/2020   HGBA1C 5.5 06/06/2019    DG Chest 2 View  Result Date: 08/20/2019 CLINICAL DATA:  Thoracic spine pain radiating into the chest for 3 weeks. EXAM: CHEST - 2 VIEW COMPARISON:  PA and lateral chest 04/05/2014. FINDINGS: Lungs are clear. Heart size is normal. No pneumothorax or pleural fluid. No acute or focal bony abnormality. Multilevel anterior endplate spurring appears unchanged. IMPRESSION: No acute disease. 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 Electronically Signed   By: Inge Rise M.D.   On: 08/20/2019 14:07   DG Thoracic Spine W/Swimmers  Result Date: 08/20/2019 CLINICAL DATA:  Chest pain, no known injury, initial encounter EXAM: THORACIC SPINE - 3 VIEWS COMPARISON:  04/15/2011 FINDINGS: Vertebral body height is well maintained. Multilevel osteophytic changes are seen. No acute compression deformity is noted. No paraspinal mass is seen. IMPRESSION: Degenerative change without acute abnormality. Electronically Signed   By: Linus Mako.D.  On: 08/20/2019 14:15    Assessment & Plan:   Terika was seen today for hypothyroidism and hyperlipidemia.  Diagnoses and all orders for this visit:  Essential hypertension- Her blood pressure is well controlled.  Electrolytes and renal function are normal. -     Basic metabolic panel  Hyperlipidemia with target LDL less than 130- She has achieved her LDL goal is doing well on the statin. -     Lipid panel -     TSH -     Pitavastatin Calcium (LIVALO) 2 MG TABS; Take 1 tablet (2 mg total) by mouth daily.  Acquired hypothyroidism- Her TSH is in the normal range.  She will remain on the current dose of levothyroxine. -     TSH -     thyroid (NP THYROID) 60 MG tablet; Take 1 tablet (60 mg total) by mouth daily before breakfast.   I am having Zena Amos. Furr maintain her Vitamin B-12 CR, Fish Oil, Vitamin D3, CALCIUM PO, aspirin, nitroGLYCERIN, Nutritional Supplements (DHEA PO), pantoprazole, NONFORMULARY OR COMPOUNDED ITEM, ALPRAZolam, carvedilol, meloxicam, Fluzone High-Dose Quadrivalent, Livalo, and thyroid.  Meds ordered this encounter  Medications   Pitavastatin Calcium (LIVALO) 2 MG TABS    Sig: Take 1 tablet (2 mg total) by mouth daily.    Dispense:  90 tablet    Refill:  1   thyroid (NP THYROID) 60 MG tablet    Sig: Take 1 tablet (60 mg total) by mouth daily before breakfast.    Dispense:  90 tablet    Refill:  1     Follow-up: Return in about 6 months (around 07/13/2020).  Scarlette Calico, MD

## 2020-01-15 ENCOUNTER — Encounter: Payer: Self-pay | Admitting: Internal Medicine

## 2020-02-06 ENCOUNTER — Telehealth: Payer: Self-pay

## 2020-02-13 ENCOUNTER — Encounter: Payer: Self-pay | Admitting: Internal Medicine

## 2020-02-13 ENCOUNTER — Other Ambulatory Visit: Payer: Self-pay | Admitting: Internal Medicine

## 2020-04-09 ENCOUNTER — Encounter: Payer: Self-pay | Admitting: Internal Medicine

## 2020-04-17 ENCOUNTER — Encounter: Payer: Self-pay | Admitting: Internal Medicine

## 2020-04-17 ENCOUNTER — Ambulatory Visit (INDEPENDENT_AMBULATORY_CARE_PROVIDER_SITE_OTHER): Payer: Medicare Other

## 2020-04-17 ENCOUNTER — Ambulatory Visit (INDEPENDENT_AMBULATORY_CARE_PROVIDER_SITE_OTHER): Payer: Medicare Other | Admitting: Internal Medicine

## 2020-04-17 ENCOUNTER — Other Ambulatory Visit: Payer: Self-pay

## 2020-04-17 ENCOUNTER — Other Ambulatory Visit: Payer: Self-pay | Admitting: Internal Medicine

## 2020-04-17 VITALS — BP 142/88 | HR 81 | Temp 98.2°F | Resp 16 | Ht 60.0 in | Wt 129.1 lb

## 2020-04-17 DIAGNOSIS — G8929 Other chronic pain: Secondary | ICD-10-CM

## 2020-04-17 DIAGNOSIS — I10 Essential (primary) hypertension: Secondary | ICD-10-CM | POA: Diagnosis not present

## 2020-04-17 DIAGNOSIS — E039 Hypothyroidism, unspecified: Secondary | ICD-10-CM

## 2020-04-17 DIAGNOSIS — M25552 Pain in left hip: Secondary | ICD-10-CM | POA: Diagnosis not present

## 2020-04-17 DIAGNOSIS — E785 Hyperlipidemia, unspecified: Secondary | ICD-10-CM | POA: Diagnosis not present

## 2020-04-17 DIAGNOSIS — M7062 Trochanteric bursitis, left hip: Secondary | ICD-10-CM | POA: Diagnosis not present

## 2020-04-17 LAB — CK: Total CK: 44 U/L (ref 7–177)

## 2020-04-17 LAB — LIPID PANEL
Cholesterol: 202 mg/dL — ABNORMAL HIGH (ref 0–200)
HDL: 65.2 mg/dL (ref >39.00)
LDL Cholesterol: 124 mg/dL — ABNORMAL HIGH (ref 0–99)
NonHDL: 137.24
Total CHOL/HDL Ratio: 3
Triglycerides: 66 mg/dL (ref 0.0–149.0)
VLDL: 13.2 mg/dL (ref 0.0–40.0)

## 2020-04-17 NOTE — Progress Notes (Signed)
Subjective:  Patient ID: [name], female    DOB: 1941/07/12  Age: 79 y.o. MRN: [id number]  CC: Hypertension  This visit occurred during the SARS-CoV-2 public health emergency.  Safety protocols were in place, including screening questions prior to the visit, additional usage of staff PPE, and extensive cleaning of exam room while observing appropriate contact time as indicated for disinfecting solutions.    HPI [name] presents for f/up - She complains of a 5-month history of nontraumatic left hip pain.  She denies claudication.  She says the pain is over the outer most part of her hip and increases with exercise.  She is getting adequate symptom relief with an over-the-counter NSAID.  Outpatient Medications Prior to Visit  Medication Sig Dispense Refill  . ALPRAZolam (XANAX) 0.25 MG tablet TAKE ONE TABLET BY MOUTH THREE TIMES DAILY AS NEEDED FOR SLEEP OR ANXIETY 180 tablet 1  . aspirin 81 MG tablet Take 81 mg by mouth daily.     Marland Kitchen CALCIUM PO Take 1-2 capsules by mouth at bedtime.     . carvedilol (COREG) 3.125 MG tablet Take 1 tablet (3.125 mg total) by mouth daily. TAKE 1 TABLET BY MOUTH TWICE DAILY WITH A MEAL 90 tablet 1  . Cholecalciferol (VITAMIN D3) 5000 UNITS TABS Take 1 tablet by mouth daily.     . Cyanocobalamin (VITAMIN B-12 CR) 1500 MCG TBCR Take 1 tablet by mouth 3 (three) times a week.     . Nutritional Supplements (DHEA PO) Take 5 mg by mouth. 5 days per week    . Omega-3 Fatty Acids (FISH OIL) 1000 MG CAPS Take 2 capsules by mouth daily.     . pantoprazole (PROTONIX) 40 MG tablet Take 1 tablet (40 mg total) by mouth daily as needed (acid reflux). 90 tablet 1  . thyroid (NP THYROID) 60 MG tablet Take 1 tablet (60 mg total) by mouth daily before breakfast. 90 tablet 1  . FLUZONE HIGH-DOSE QUADRIVALENT 0.7 ML SUSY     . meloxicam (MOBIC) 7.5 MG tablet Take 1 tablet (7.5 mg total) by mouth daily. 90 tablet 1  . nitroGLYCERIN (NITROSTAT) 0.4 MG SL tablet Place  1 tablet (0.4 mg total) under the tongue every 5 (five) minutes as needed. 25 tablet 3  . NONFORMULARY OR COMPOUNDED ITEM Triest (8-1-1) Progesterone/Testosterone (poly) (cinn) 1.25-200-0.375 mg troche daily. 30 each 5   No facility-administered medications prior to visit.    ROS Review of Systems  Constitutional: Negative.  Negative for chills, diaphoresis, fatigue and fever.  HENT: Negative.   Respiratory: Negative for cough, chest tightness, shortness of breath and wheezing.   Cardiovascular: Negative for chest pain, palpitations and leg swelling.  Gastrointestinal: Negative for abdominal pain, constipation, diarrhea, nausea and vomiting.  Endocrine: Negative.   Genitourinary: Negative.  Negative for difficulty urinating.  Musculoskeletal: Positive for arthralgias. Negative for back pain and myalgias.  Skin: Negative.   Neurological: Negative.  Negative for dizziness, weakness, light-headedness and numbness.  Hematological: Negative for adenopathy. Does not bruise/bleed easily.  Psychiatric/Behavioral: Negative.     Objective:  BP (!) 142/88 (BP Location: Left Arm, Patient Position: Sitting, Cuff Size: Normal)   Pulse 81   Temp 98.2 F (36.8 C) (Oral)   Resp 16   Ht 5' (1.524 m)   Wt 129 lb 2 oz (58.6 kg)   SpO2 96%   BMI 25.22 kg/m   BP Readings from Last 3 Encounters:  04/17/20 (!) 142/88  01/14/20 126/78  08/20/19  140/90    Wt Readings from Last 3 Encounters:  04/17/20 129 lb 2 oz (58.6 kg)  01/14/20 128 lb (58.1 kg)  08/20/19 128 lb (58.1 kg)    Physical Exam Vitals reviewed.  Constitutional:      Appearance: Normal appearance.  HENT:     Nose: Nose normal.     Mouth/Throat:     Mouth: Mucous membranes are moist.  Eyes:     General: No scleral icterus.    Conjunctiva/sclera: Conjunctivae normal.  Cardiovascular:     Rate and Rhythm: Normal rate and regular rhythm.     Heart sounds: No murmur.  Pulmonary:     Effort: Pulmonary effort is normal.      Breath sounds: No stridor. No wheezing, rhonchi or rales.  Abdominal:     General: Abdomen is flat.     Palpations: There is no mass.     Tenderness: There is no abdominal tenderness. There is no guarding.  Musculoskeletal:        General: Normal range of motion.     Cervical back: Neck supple.     Right hip: Normal. No deformity.     Left hip: Bony tenderness (TTP over the GT) present. No deformity, lacerations or tenderness. Normal range of motion. Normal strength.     Right lower leg: No swelling. No edema.     Left lower leg: No swelling. No edema.  Lymphadenopathy:     Cervical: No cervical adenopathy.  Skin:    General: Skin is warm and dry.     Coloration: Skin is not pale.  Neurological:     General: No focal deficit present.     Mental Status: She is alert.  Psychiatric:        Mood and Affect: Mood normal.        Behavior: Behavior normal.     Lab Results  Component Value Date   WBC 4.7 08/20/2019   HGB 14.1 08/20/2019   HCT 41.9 08/20/2019   PLT 211.0 08/20/2019   GLUCOSE 85 01/14/2020   CHOL 202 (H) 04/17/2020   TRIG 66.0 04/17/2020   HDL 65.20 04/17/2020   LDLDIRECT 130.0 08/03/2011   LDLCALC 124 (H) 04/17/2020   ALT 15 06/06/2019   AST 19 06/06/2019   NA 139 01/14/2020   K 4.1 01/14/2020   CL 105 01/14/2020   CREATININE 0.73 01/14/2020   BUN 6 01/14/2020   CO2 26 01/14/2020   TSH 0.52 01/14/2020   HGBA1C 5.5 06/06/2019    DG Chest 2 View  Result Date: 08/20/2019 CLINICAL DATA:  Thoracic spine pain radiating into the chest for 3 weeks. EXAM: CHEST - 2 VIEW COMPARISON:  PA and lateral chest 04/05/2014. FINDINGS: Lungs are clear. Heart size is normal. No pneumothorax or pleural fluid. No acute or focal bony abnormality. Multilevel anterior endplate spurring appears unchanged. IMPRESSION: No acute disease. 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 Electronically Signed   By: Inge Rise M.D.   On: 08/20/2019 14:07   DG Thoracic Spine  W/Swimmers  Result Date: 08/20/2019 CLINICAL DATA:  Chest pain, no known injury, initial encounter EXAM: THORACIC SPINE - 3 VIEWS COMPARISON:  04/15/2011 FINDINGS: Vertebral body height is well maintained. Multilevel osteophytic changes are seen. No acute compression deformity is noted. No paraspinal mass is seen. IMPRESSION: Degenerative change without acute abnormality. Electronically Signed   By: Inez Catalina M.D.   On: 08/20/2019 14:15   DG HIP UNILAT WITH PELVIS 2-3 VIEWS LEFT  Result Date: 04/18/2020 CLINICAL DATA:  Nontraumatic chronic LEFT hip pain for 3 months EXAM: DG HIP (WITH OR WITHOUT PELVIS) 2-3V LEFT COMPARISON:  None FINDINGS: Osseous demineralization. Hip and SI joint spaces preserved. No fracture, dislocation, or bone destruction. IMPRESSION: Osseous demineralization. No acute bony abnormalities. Electronically Signed   By: Lavonia Dana M.D.   On: 04/18/2020 07:46    Assessment & Plan:   Artisha was seen today for hypertension.  Diagnoses and all orders for this visit:  Essential hypertension- Her BP is adequately well controlled.  Acquired hypothyroidism  Chronic left hip pain- Based on her symptoms, exam, and normal x-rays this is consistent with trochanteric bursitis.  She feels like she is getting adequate symptom relief with the anti-inflammatory and does not wish to undergo a an injection of the bursa or physical therapy. -     Cancel: DG HIP UNILAT WITH PELVIS MIN 4 VIEWS LEFT; Future  Trochanteric bursitis of left hip  Hyperlipidemia with target LDL less than 130- She has an elevated ASCVD risk score and agrees to start taking the statin again. -     Lipid panel; Future -     CK; Future -     CK -     Lipid panel   I have discontinued Izora Gala P. Elsbernd's nitroGLYCERIN, NONFORMULARY OR COMPOUNDED ITEM, meloxicam, and Fluzone High-Dose Quadrivalent. I am also having her maintain her Vitamin B-12 CR, Fish Oil, Vitamin D3, CALCIUM PO, aspirin, Nutritional  Supplements (DHEA PO), pantoprazole, ALPRAZolam, carvedilol, and thyroid.  No orders of the defined types were placed in this encounter.  DG HIP UNILAT WITH PELVIS 2-3 VIEWS LEFT  Result Date: 04/18/2020 CLINICAL DATA:  Nontraumatic chronic LEFT hip pain for 3 months EXAM: DG HIP (WITH OR WITHOUT PELVIS) 2-3V LEFT COMPARISON:  None FINDINGS: Osseous demineralization. Hip and SI joint spaces preserved. No fracture, dislocation, or bone destruction. IMPRESSION: Osseous demineralization. No acute bony abnormalities. Electronically Signed   By: Lavonia Dana M.D.   On: 04/18/2020 07:46    Follow-up: Return in about 3 months (around 07/18/2020).  Scarlette Calico, MD

## 2020-04-17 NOTE — Patient Instructions (Signed)
Hip Bursitis  Hip bursitis is inflammation of a fluid-filled sac (bursa) in the hip joint. The bursa prevents the bones in the hip joint from rubbing against each other. Hip bursitis can cause mild to moderate pain, and symptoms often come and go over time. What are the causes? This condition may be caused by:  Injury to the hip.  Overuse of the muscles that surround the hip joint.  Previous injury or surgery of the hip.  Arthritis or gout.  Diabetes.  Thyroid disease.  Infection. In some cases, the cause may not be known. What are the signs or symptoms? Symptoms of this condition include:  Mild or moderate pain in the hip area. Pain may get worse with movement.  Tenderness and swelling of the hip, especially on the outer side of the hip.  In rare cases, the bursa may become infected. This may cause a fever, as well as warmth and redness in the area. Symptoms may come and go. How is this diagnosed? This condition may be diagnosed based on:  A physical exam.  Your medical history.  X-rays.  Removal of fluid from your inflamed bursa for testing (biopsy). You may be sent to a health care provider who specializes in bone diseases (orthopedist) or a provider who specializes in joint inflammation (rheumatologist). How is this treated? This condition is treated by resting, icing, applying pressure (compression), and raising (elevating) the injured area. This is called RICE treatment. In some cases, this may be enough to make your symptoms go away. Treatment may also include:  Using crutches.  Draining fluid out of the bursa to help relieve swelling.  Injecting medicine that helps to reduce inflammation (cortisone).  Additional medicines if the bursa is infected. Follow these instructions at home: Managing pain, stiffness, and swelling   If directed, put ice on the painful area. ? Put ice in a plastic bag. ? Place a towel between your skin and the bag. ? Leave the ice  on for 20 minutes, 2-3 times a day. ? Raise (elevate) your hip above the level of your heart as much as you can without pain. To do this, try putting a pillow under your hips while you lie down. Activity  Return to your normal activities as told by your health care provider. Ask your health care provider what activities are safe for you.  Rest and protect your hip as much as possible until your pain and swelling get better. General instructions  Take over-the-counter and prescription medicines only as told by your health care provider.  Wear compression wraps only as told by your health care provider.  Do not use your hip to support your body weight until your health care provider says that you can. Use crutches as told by your health care provider.  Gently massage and stretch your injured area as often as is comfortable.  Keep all follow-up visits as told by your health care provider. This is important. How is this prevented?  Exercise regularly, as told by your health care provider.  Warm up and stretch before being active.  Cool down and stretch after being active.  If an activity irritates your hip or causes pain, avoid the activity as much as possible.  Avoid sitting down for long periods at a time. Contact a health care provider if you:  Have a fever.  Develop new symptoms.  Have difficulty walking or doing everyday activities.  Have pain that gets worse or does not get better with medicine.    Develop red skin or a feeling of warmth in your hip area. Get help right away if you:  Cannot move your hip.  Have severe pain. Summary  Hip bursitis is inflammation of a fluid-filled sac (bursa) in the hip joint.  Hip bursitis can cause mild to moderate pain, and symptoms often come and go over time.  This condition is treated with rest, ice, compression, elevation, and medicines. This information is not intended to replace advice given to you by your health care  provider. Make sure you discuss any questions you have with your health care provider. Document Revised: 07/17/2018 Document Reviewed: 07/17/2018 [employer] Patient Education  2020 [employer]  

## 2020-04-18 ENCOUNTER — Encounter: Payer: Self-pay | Admitting: Internal Medicine

## 2020-04-22 DIAGNOSIS — H0102A Squamous blepharitis right eye, upper and lower eyelids: Secondary | ICD-10-CM | POA: Diagnosis not present

## 2020-04-22 DIAGNOSIS — H0102B Squamous blepharitis left eye, upper and lower eyelids: Secondary | ICD-10-CM | POA: Diagnosis not present

## 2020-04-22 DIAGNOSIS — H16142 Punctate keratitis, left eye: Secondary | ICD-10-CM | POA: Diagnosis not present

## 2020-04-22 DIAGNOSIS — H0015 Chalazion left lower eyelid: Secondary | ICD-10-CM | POA: Diagnosis not present

## 2020-04-22 DIAGNOSIS — H04123 Dry eye syndrome of bilateral lacrimal glands: Secondary | ICD-10-CM | POA: Diagnosis not present

## 2020-05-20 ENCOUNTER — Other Ambulatory Visit: Payer: Self-pay

## 2020-05-20 ENCOUNTER — Encounter: Payer: Self-pay | Admitting: Internal Medicine

## 2020-05-20 ENCOUNTER — Ambulatory Visit (INDEPENDENT_AMBULATORY_CARE_PROVIDER_SITE_OTHER): Payer: Medicare Other | Admitting: Internal Medicine

## 2020-05-20 VITALS — BP 140/72 | HR 74 | Temp 97.8°F | Resp 16 | Ht 60.0 in | Wt 128.0 lb

## 2020-05-20 DIAGNOSIS — R10819 Abdominal tenderness, unspecified site: Secondary | ICD-10-CM | POA: Diagnosis not present

## 2020-05-20 DIAGNOSIS — K5792 Diverticulitis of intestine, part unspecified, without perforation or abscess without bleeding: Secondary | ICD-10-CM | POA: Insufficient documentation

## 2020-05-20 MED ORDER — AMOXICILLIN-POT CLAVULANATE 875-125 MG PO TABS: 1.0000 | ORAL_TABLET | Freq: Two times a day (BID) | ORAL | 0 refills | Status: AC

## 2020-05-20 NOTE — Progress Notes (Signed)
Subjective:  Patient ID: [name], female    DOB: 1941-11-09  Age: 79 y.o. MRN: [id number]  CC: Abdominal Pain  This visit occurred during the SARS-CoV-2 public health emergency.  Safety protocols were in place, including screening questions prior to the visit, additional usage of staff PPE, and extensive cleaning of exam room while observing appropriate contact time as indicated for disinfecting solutions.    HPI [name] presents for concerns about abd pain.  She complains of a 4-day history of intermittent discomfort in her mid lower abdomen and left lower abdomen.  She has had a fever to 100.1 with chills.  She also had 1 bowel movement that was slightly bloody and mucoid.  She had some constipation so she took a dose of MiraLAX but having a bowel movement did not improve the abdominal pain.  She is getting adequately relief from the discomfort with Tylenol.  She denies loss of appetite, nausea, vomiting, dysuria, or hematuria.  Outpatient Medications Prior to Visit  Medication Sig Dispense Refill  . ALPRAZolam (XANAX) 0.25 MG tablet TAKE ONE TABLET BY MOUTH THREE TIMES DAILY AS NEEDED FOR SLEEP OR ANXIETY 180 tablet 1  . aspirin 81 MG tablet Take 81 mg by mouth daily.     Marland Kitchen CALCIUM PO Take 1-2 capsules by mouth at bedtime.     . carvedilol (COREG) 3.125 MG tablet Take 1 tablet (3.125 mg total) by mouth daily. TAKE 1 TABLET BY MOUTH TWICE DAILY WITH A MEAL 90 tablet 1  . Cholecalciferol (VITAMIN D3) 5000 UNITS TABS Take 1 tablet by mouth daily.     . Cyanocobalamin (VITAMIN B-12 CR) 1500 MCG TBCR Take 1 tablet by mouth 3 (three) times a week.     . Nutritional Supplements (DHEA PO) Take 5 mg by mouth. 5 days per week    . Omega-3 Fatty Acids (FISH OIL) 1000 MG CAPS Take 2 capsules by mouth daily.     . pantoprazole (PROTONIX) 40 MG tablet Take 1 tablet (40 mg total) by mouth daily as needed (acid reflux). 90 tablet 1  . thyroid (NP THYROID) 60 MG tablet Take 1 tablet (60  mg total) by mouth daily before breakfast. 90 tablet 1  . LIVALO 2 MG TABS Take 1 tablet by mouth daily.     No facility-administered medications prior to visit.    ROS Review of Systems  Constitutional: Positive for chills and fever. Negative for diaphoresis and fatigue.  HENT: Negative.  Negative for sore throat.   Eyes: Negative for visual disturbance.  Respiratory: Negative for cough, chest tightness, shortness of breath and wheezing.   Cardiovascular: Negative for chest pain, palpitations and leg swelling.  Gastrointestinal: Positive for abdominal pain, blood in stool and constipation. Negative for diarrhea, nausea and vomiting.  Endocrine: Negative.   Genitourinary: Negative.  Negative for decreased urine volume, difficulty urinating, dysuria, flank pain, frequency, hematuria and urgency.  Musculoskeletal: Negative.   Skin: Negative.  Negative for color change.  Neurological: Negative.  Negative for dizziness, weakness, light-headedness and headaches.  Hematological: Negative for adenopathy. Does not bruise/bleed easily.  Psychiatric/Behavioral: Negative.     Objective:  BP 140/72 (BP Location: Left Arm, Patient Position: Sitting, Cuff Size: Normal)   Pulse 74   Temp 97.8 F (36.6 C) (Oral)   Resp 16   Ht 5' (1.524 m)   Wt 128 lb (58.1 kg)   SpO2 95%   BMI 25.00 kg/m   BP Readings from Last 3 Encounters:  05/20/20 140/72  04/17/20 (!) 142/88  01/14/20 126/78    Wt Readings from Last 3 Encounters:  05/20/20 128 lb (58.1 kg)  04/17/20 129 lb 2 oz (58.6 kg)  01/14/20 128 lb (58.1 kg)    Physical Exam Vitals reviewed.  Constitutional:      General: She is not in acute distress.    Appearance: Normal appearance. She is well-developed. She is not ill-appearing, toxic-appearing or diaphoretic.  HENT:     Mouth/Throat:     Mouth: Mucous membranes are moist.  Eyes:     Extraocular Movements: Extraocular movements intact.     Conjunctiva/sclera: Conjunctivae  normal.  Cardiovascular:     Rate and Rhythm: Normal rate and regular rhythm.     Heart sounds: No murmur heard.   Pulmonary:     Effort: Pulmonary effort is normal.     Breath sounds: No stridor. No wheezing, rhonchi or rales.  Abdominal:     General: Abdomen is flat. Bowel sounds are normal. There is no distension.     Palpations: Abdomen is soft. There is no hepatomegaly, splenomegaly or mass.     Tenderness: There is abdominal tenderness in the suprapubic area and left lower quadrant. There is no guarding or rebound. Negative signs include psoas sign.  Musculoskeletal:        General: Normal range of motion.     Right lower leg: No edema.     Left lower leg: No edema.  Skin:    General: Skin is warm and dry.  Neurological:     General: No focal deficit present.     Mental Status: She is alert.  Psychiatric:        Mood and Affect: Mood normal.     Lab Results  Component Value Date   WBC 4.6 05/20/2020   HGB 13.1 05/20/2020   HCT 37.8 05/20/2020   PLT 244.0 05/20/2020   GLUCOSE 85 01/14/2020   CHOL 202 (H) 04/17/2020   TRIG 66.0 04/17/2020   HDL 65.20 04/17/2020   LDLDIRECT 130.0 08/03/2011   LDLCALC 124 (H) 04/17/2020   ALT 15 06/06/2019   AST 19 06/06/2019   NA 139 01/14/2020   K 4.1 01/14/2020   CL 105 01/14/2020   CREATININE 0.73 01/14/2020   BUN 6 01/14/2020   CO2 26 01/14/2020   TSH 0.52 01/14/2020   HGBA1C 5.5 06/06/2019    DG Chest 2 View  Result Date: 08/20/2019 CLINICAL DATA:  Thoracic spine pain radiating into the chest for 3 weeks. EXAM: CHEST - 2 VIEW COMPARISON:  PA and lateral chest 04/05/2014. FINDINGS: Lungs are clear. Heart size is normal. No pneumothorax or pleural fluid. No acute or focal bony abnormality. Multilevel anterior endplate spurring appears unchanged. IMPRESSION: No acute disease. 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 Electronically Signed   By: Inge Rise M.D.   On: 08/20/2019 14:07   DG Thoracic Spine  W/Swimmers  Result Date: 08/20/2019 CLINICAL DATA:  Chest pain, no known injury, initial encounter EXAM: THORACIC SPINE - 3 VIEWS COMPARISON:  04/15/2011 FINDINGS: Vertebral body height is well maintained. Multilevel osteophytic changes are seen. No acute compression deformity is noted. No paraspinal mass is seen. IMPRESSION: Degenerative change without acute abnormality. Electronically Signed   By: Inez Catalina M.D.   On: 08/20/2019 14:15   DG ABD ACUTE 2+V W 1V CHEST  Result Date: 05/22/2020 CLINICAL DATA:  Lower quadrant abdominal pain for 6 days. EXAM: DG ABDOMEN ACUTE W/ 1V CHEST COMPARISON:  None. FINDINGS: The cardiac silhouette, mediastinal and hilar contours are within normal limits. The lungs demonstrate emphysematous changes and hyperinflation. No infiltrates, effusions or worrisome pulmonary lesions. Two views of the abdomen demonstrate an unremarkable bowel gas pattern. Scattered stool noted throughout the colon. No distended small bowel loops appear the soft tissue shadows of the abdomen are maintained. No worrisome calcifications. The bony structures are unremarkable. IMPRESSION: Emphysematous changes but no acute pulmonary findings. No plain film findings for an acute abdominal process. Electronically Signed   By: Marijo Sanes M.D.   On: 05/22/2020 09:57    Assessment & Plan:   [name] was seen today for abdominal pain.  Diagnoses and all orders for this visit:  Abdominal tenderness without rebound tenderness, unspecified location- Based on her symptoms, exam, and labs showing a slightly elevated sed rate as well as normal plain films I think she is having an episode of diverticulitis.  Will treat this with Augmentin. -     CBC with Differential/Platelet; Future -     Urinalysis, Routine w reflex microscopic; Future -     Sedimentation rate; Future -     Amylase; Future -     Lipase; Future -     DG ABD ACUTE 2+V W 1V CHEST; Future -     Lipase -     Amylase -     Sedimentation  rate -     CBC with Differential/Platelet -     Urinalysis, Routine w reflex microscopic  Diverticulitis -     amoxicillin-clavulanate (AUGMENTIN) 875-125 MG tablet; Take 1 tablet by mouth 2 (two) times daily for 7 days.   I am having [name] start on amoxicillin-clavulanate. I am also having her maintain her Vitamin B-12 CR, Fish Oil, Vitamin D3, CALCIUM PO, aspirin, Nutritional Supplements (DHEA PO), pantoprazole, ALPRAZolam, carvedilol, thyroid, and Livalo.  Meds ordered this encounter  Medications  . amoxicillin-clavulanate (AUGMENTIN) 875-125 MG tablet    Sig: Take 1 tablet by mouth 2 (two) times daily for 7 days.    Dispense:  14 tablet    Refill:  0     Follow-up: Return in about 1 week (around 05/27/2020).  Scarlette Calico, MD

## 2020-05-20 NOTE — Patient Instructions (Signed)
Abdominal Pain, Adult Pain in the abdomen (abdominal pain) can be caused by many things. Often, abdominal pain is not serious and it gets better with no treatment or by being treated at home. However, sometimes abdominal pain is serious. Your health care provider will ask questions about your medical history and do a physical exam to try to determine the cause of your abdominal pain. Follow these instructions at home:  Medicines  Take over-the-counter and prescription medicines only as told by your health care provider.  Do not take a laxative unless told by your health care provider. General instructions  Watch your condition for any changes.  Drink enough fluid to keep your urine pale yellow.  Keep all follow-up visits as told by your health care provider. This is important. Contact a health care provider if:  Your abdominal pain changes or gets worse.  You are not hungry or you lose weight without trying.  You are constipated or have diarrhea for more than 2-3 days.  You have pain when you urinate or have a bowel movement.  Your abdominal pain wakes you up at night.  Your pain gets worse with meals, after eating, or with certain foods.  You are vomiting and cannot keep anything down.  You have a fever.  You have blood in your urine. Get help right away if:  Your pain does not go away as soon as your health care provider told you to expect.  You cannot stop vomiting.  Your pain is only in areas of the abdomen, such as the right side or the left lower portion of the abdomen. Pain on the right side could be caused by appendicitis.  You have bloody or black stools, or stools that look like tar.  You have severe pain, cramping, or bloating in your abdomen.  You have signs of dehydration, such as: ? Dark urine, very little urine, or no urine. ? Cracked lips. ? Dry mouth. ? Sunken eyes. ? Sleepiness. ? Weakness.  You have trouble breathing or chest  pain. Summary  Often, abdominal pain is not serious and it gets better with no treatment or by being treated at home. However, sometimes abdominal pain is serious.  Watch your condition for any changes.  Take over-the-counter and prescription medicines only as told by your health care provider.  Contact a health care provider if your abdominal pain changes or gets worse.  Get help right away if you have severe pain, cramping, or bloating in your abdomen. This information is not intended to replace advice given to you by your health care provider. Make sure you discuss any questions you have with your health care provider. Document Revised: 03/19/2019 Document Reviewed: 03/19/2019 Elsevier Patient Education  2020 [employer]  

## 2020-05-21 LAB — URINALYSIS, ROUTINE W REFLEX MICROSCOPIC
Bilirubin Urine: NEGATIVE
Ketones, ur: NEGATIVE
Leukocytes,Ua: NEGATIVE
Nitrite: NEGATIVE
Specific Gravity, Urine: 1.01 (ref 1.000–1.030)
Total Protein, Urine: NEGATIVE
Urine Glucose: NEGATIVE
Urobilinogen, UA: 0.2 (ref 0.0–1.0)
pH: 6 (ref 5.0–8.0)

## 2020-05-21 LAB — CBC WITH DIFFERENTIAL/PLATELET
Basophils Absolute: 0 10*3/uL (ref 0.0–0.1)
Basophils Relative: 0.3 % (ref 0.0–3.0)
Eosinophils Absolute: 0.3 10*3/uL (ref 0.0–0.7)
Eosinophils Relative: 6 % — ABNORMAL HIGH (ref 0.0–5.0)
HCT: 37.8 % (ref 36.0–46.0)
Hemoglobin: 13.1 g/dL (ref 12.0–15.0)
Lymphocytes Relative: 41.5 % (ref 12.0–46.0)
Lymphs Abs: 1.9 10*3/uL (ref 0.7–4.0)
MCHC: 34.7 g/dL (ref 30.0–36.0)
MCV: 92.2 fl (ref 78.0–100.0)
Monocytes Absolute: 0.5 10*3/uL (ref 0.1–1.0)
Monocytes Relative: 11.5 % (ref 3.0–12.0)
Neutro Abs: 1.9 10*3/uL (ref 1.4–7.7)
Neutrophils Relative %: 40.7 % — ABNORMAL LOW (ref 43.0–77.0)
Platelets: 244 10*3/uL (ref 150.0–400.0)
RBC: 4.1 Mil/uL (ref 3.87–5.11)
RDW: 13.3 % (ref 11.5–15.5)
WBC: 4.6 10*3/uL (ref 4.0–10.5)

## 2020-05-21 LAB — AMYLASE: Amylase: 40 U/L (ref 27–131)

## 2020-05-21 LAB — SEDIMENTATION RATE: Sed Rate: 42 mm/hr — ABNORMAL HIGH (ref 0–30)

## 2020-05-21 LAB — LIPASE: Lipase: 15 U/L (ref 11.0–59.0)

## 2020-05-22 ENCOUNTER — Ambulatory Visit (INDEPENDENT_AMBULATORY_CARE_PROVIDER_SITE_OTHER): Payer: Medicare Other

## 2020-05-22 ENCOUNTER — Encounter: Payer: Self-pay | Admitting: Internal Medicine

## 2020-05-22 DIAGNOSIS — R10819 Abdominal tenderness, unspecified site: Secondary | ICD-10-CM | POA: Diagnosis not present

## 2020-05-22 DIAGNOSIS — R1030 Lower abdominal pain, unspecified: Secondary | ICD-10-CM | POA: Diagnosis not present

## 2020-05-28 ENCOUNTER — Encounter: Payer: Self-pay | Admitting: Internal Medicine

## 2020-05-28 ENCOUNTER — Ambulatory Visit (INDEPENDENT_AMBULATORY_CARE_PROVIDER_SITE_OTHER): Payer: Medicare Other | Admitting: Internal Medicine

## 2020-05-28 ENCOUNTER — Other Ambulatory Visit: Payer: Self-pay

## 2020-05-28 VITALS — BP 118/68 | HR 87 | Temp 98.3°F | Ht 60.0 in | Wt 129.0 lb

## 2020-05-28 DIAGNOSIS — K581 Irritable bowel syndrome with constipation: Secondary | ICD-10-CM | POA: Diagnosis not present

## 2020-05-28 MED ORDER — LINACLOTIDE 290 MCG PO CAPS: 290.0000 ug | ORAL_CAPSULE | Freq: Every day | ORAL | 0 refills | Status: DC

## 2020-05-28 NOTE — Patient Instructions (Signed)

## 2020-05-28 NOTE — Progress Notes (Signed)
Subjective:  Patient ID: [name], female    DOB: February 02, 1941  Age: 79 y.o. MRN: [id number]  CC: Abdominal Pain  This visit occurred during the SARS-CoV-2 public health emergency.  Safety protocols were in place, including screening questions prior to the visit, additional usage of staff PPE, and extensive cleaning of exam room while observing appropriate contact time as indicated for disinfecting solutions.    HPI [name] presents for f/up - She was recently seen for an episode of lower abdominal pain.  Labs and plain films were reassuring.  It was presumed to be an episode of diverticulitis.  She completed a course of Augmentin and says the lower abdominal pain has resolved.  She now complains of intermittent constipation and has a soreness in her epigastric region.  She has not gotten much symptom relief with MiraLAX and Colace.  She denies nausea, vomiting, loss of appetite, weight loss, diarrhea, dysuria, or hematuria. She has a hx of IBS.   Outpatient Medications Prior to Visit  Medication Sig Dispense Refill  . ALPRAZolam (XANAX) 0.25 MG tablet TAKE ONE TABLET BY MOUTH THREE TIMES DAILY AS NEEDED FOR SLEEP OR ANXIETY 180 tablet 1  . aspirin 81 MG tablet Take 81 mg by mouth daily.     Marland Kitchen CALCIUM PO Take 1-2 capsules by mouth at bedtime.     . carvedilol (COREG) 3.125 MG tablet Take 1 tablet (3.125 mg total) by mouth daily. TAKE 1 TABLET BY MOUTH TWICE DAILY WITH A MEAL 90 tablet 1  . Cholecalciferol (VITAMIN D3) 5000 UNITS TABS Take 1 tablet by mouth daily.     . Cyanocobalamin (VITAMIN B-12 CR) 1500 MCG TBCR Take 1 tablet by mouth 3 (three) times a week.     Marland Kitchen LIVALO 2 MG TABS Take 1 tablet by mouth daily.    . Nutritional Supplements (DHEA PO) Take 5 mg by mouth. 5 days per week    . Omega-3 Fatty Acids (FISH OIL) 1000 MG CAPS Take 2 capsules by mouth daily.     . pantoprazole (PROTONIX) 40 MG tablet Take 1 tablet (40 mg total) by mouth daily as needed (acid reflux). 90  tablet 1  . thyroid (NP THYROID) 60 MG tablet Take 1 tablet (60 mg total) by mouth daily before breakfast. 90 tablet 1   No facility-administered medications prior to visit.    ROS Review of Systems  Constitutional: Negative for appetite change, chills, diaphoresis, fatigue, fever and unexpected weight change.  HENT: Negative.  Negative for trouble swallowing.   Eyes: Negative.   Respiratory: Negative for cough, chest tightness, shortness of breath and wheezing.   Cardiovascular: Negative for chest pain, palpitations and leg swelling.  Gastrointestinal: Positive for abdominal pain and constipation. Negative for diarrhea, nausea and vomiting.  Endocrine: Negative.   Genitourinary: Negative.  Negative for difficulty urinating, dysuria, frequency, pelvic pain and urgency.  Musculoskeletal: Negative.  Negative for arthralgias and myalgias.  Skin: Negative.   Neurological: Negative.  Negative for dizziness, weakness and light-headedness.  Hematological: Negative for adenopathy. Does not bruise/bleed easily.  Psychiatric/Behavioral: Negative.     Objective:  BP 118/68 (BP Location: Right Arm, Patient Position: Sitting, Cuff Size: Normal)   Pulse 87   Temp 98.3 F (36.8 C) (Oral)   Ht 5' (1.524 m)   Wt 129 lb (58.5 kg)   SpO2 95%   BMI 25.19 kg/m   BP Readings from Last 3 Encounters:  05/28/20 118/68  05/20/20 140/72  04/17/20 (!) 142/88  Wt Readings from Last 3 Encounters:  05/28/20 129 lb (58.5 kg)  05/20/20 128 lb (58.1 kg)  04/17/20 129 lb 2 oz (58.6 kg)    Physical Exam Vitals reviewed.  Constitutional:      General: She is not in acute distress.    Appearance: Normal appearance. She is well-developed. She is not ill-appearing, toxic-appearing or diaphoretic.  HENT:     Nose: Nose normal.     Mouth/Throat:     Mouth: Mucous membranes are moist.  Eyes:     General: No scleral icterus.    Conjunctiva/sclera: Conjunctivae normal.  Cardiovascular:     Rate and  Rhythm: Normal rate and regular rhythm.     Heart sounds: No murmur heard.   Pulmonary:     Effort: Pulmonary effort is normal.     Breath sounds: No stridor. No wheezing, rhonchi or rales.  Abdominal:     General: Abdomen is flat. There is no distension.     Palpations: Abdomen is soft. There is no hepatomegaly, splenomegaly or mass.     Tenderness: There is no abdominal tenderness.  Musculoskeletal:        General: Normal range of motion.     Cervical back: Neck supple.     Right lower leg: No edema.     Left lower leg: No edema.  Lymphadenopathy:     Cervical: No cervical adenopathy.  Skin:    General: Skin is warm and dry.     Coloration: Skin is not pale.  Neurological:     General: No focal deficit present.     Mental Status: She is alert.  Psychiatric:        Mood and Affect: Mood normal.        Behavior: Behavior normal.     Lab Results  Component Value Date   WBC 4.6 05/20/2020   HGB 13.1 05/20/2020   HCT 37.8 05/20/2020   PLT 244.0 05/20/2020   GLUCOSE 85 01/14/2020   CHOL 202 (H) 04/17/2020   TRIG 66.0 04/17/2020   HDL 65.20 04/17/2020   LDLDIRECT 130.0 08/03/2011   LDLCALC 124 (H) 04/17/2020   ALT 15 06/06/2019   AST 19 06/06/2019   NA 139 01/14/2020   K 4.1 01/14/2020   CL 105 01/14/2020   CREATININE 0.73 01/14/2020   BUN 6 01/14/2020   CO2 26 01/14/2020   TSH 0.52 01/14/2020   HGBA1C 5.5 06/06/2019    DG Chest 2 View  Result Date: 08/20/2019 CLINICAL DATA:  Thoracic spine pain radiating into the chest for 3 weeks. EXAM: CHEST - 2 VIEW COMPARISON:  PA and lateral chest 04/05/2014. FINDINGS: Lungs are clear. Heart size is normal. No pneumothorax or pleural fluid. No acute or focal bony abnormality. Multilevel anterior endplate spurring appears unchanged. IMPRESSION: No acute disease. 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 Electronically Signed   By: Inge Rise M.D.   On: 08/20/2019 14:07   DG Thoracic Spine W/Swimmers  Result  Date: 08/20/2019 CLINICAL DATA:  Chest pain, no known injury, initial encounter EXAM: THORACIC SPINE - 3 VIEWS COMPARISON:  04/15/2011 FINDINGS: Vertebral body height is well maintained. Multilevel osteophytic changes are seen. No acute compression deformity is noted. No paraspinal mass is seen. IMPRESSION: Degenerative change without acute abnormality. Electronically Signed   By: Inez Catalina M.D.   On: 08/20/2019 14:15    Assessment & Plan:   Namira was seen today for abdominal pain.  Diagnoses and all orders for this visit:  Irritable bowel syndrome with constipation- Her symptoms, exam, and labs are consistent with IBS with constipation and pain.  I recommended that she try high-dose linaclotide. -     linaclotide (LINZESS) 290 MCG CAPS capsule; Take 1 capsule (290 mcg total) by mouth daily before breakfast.   I am having Zena Amos. Quintero start on linaclotide. I am also having her maintain her Vitamin B-12 CR, Fish Oil, Vitamin D3, CALCIUM PO, aspirin, Nutritional Supplements (DHEA PO), pantoprazole, ALPRAZolam, carvedilol, thyroid, and Livalo.  Meds ordered this encounter  Medications  . linaclotide (LINZESS) 290 MCG CAPS capsule    Sig: Take 1 capsule (290 mcg total) by mouth daily before breakfast.    Dispense:  105 capsule    Refill:  0     Follow-up: Return in about 3 months (around 08/28/2020).  Scarlette Calico, MD

## 2020-06-16 LAB — HM HEPATITIS C SCREENING LAB: HM Hepatitis Screen: NEGATIVE

## 2020-06-24 DIAGNOSIS — H16223 Keratoconjunctivitis sicca, not specified as Sjogren's, bilateral: Secondary | ICD-10-CM | POA: Diagnosis not present

## 2020-06-24 DIAGNOSIS — H0102A Squamous blepharitis right eye, upper and lower eyelids: Secondary | ICD-10-CM | POA: Diagnosis not present

## 2020-06-24 DIAGNOSIS — H0102B Squamous blepharitis left eye, upper and lower eyelids: Secondary | ICD-10-CM | POA: Diagnosis not present

## 2020-06-24 DIAGNOSIS — H16142 Punctate keratitis, left eye: Secondary | ICD-10-CM | POA: Diagnosis not present

## 2020-06-24 DIAGNOSIS — H40013 Open angle with borderline findings, low risk, bilateral: Secondary | ICD-10-CM | POA: Diagnosis not present

## 2020-07-04 ENCOUNTER — Encounter: Payer: Self-pay | Admitting: Internal Medicine

## 2020-07-07 ENCOUNTER — Encounter: Payer: Self-pay | Admitting: Internal Medicine

## 2020-07-07 ENCOUNTER — Ambulatory Visit (INDEPENDENT_AMBULATORY_CARE_PROVIDER_SITE_OTHER): Payer: Medicare Other | Admitting: Internal Medicine

## 2020-07-07 ENCOUNTER — Other Ambulatory Visit: Payer: Self-pay

## 2020-07-07 ENCOUNTER — Ambulatory Visit (INDEPENDENT_AMBULATORY_CARE_PROVIDER_SITE_OTHER): Payer: Medicare Other

## 2020-07-07 VITALS — BP 126/76 | HR 76 | Temp 97.8°F | Resp 16 | Ht 60.0 in | Wt 127.4 lb

## 2020-07-07 DIAGNOSIS — M5030 Other cervical disc degeneration, unspecified cervical region: Secondary | ICD-10-CM

## 2020-07-07 DIAGNOSIS — M4802 Spinal stenosis, cervical region: Secondary | ICD-10-CM

## 2020-07-07 DIAGNOSIS — M542 Cervicalgia: Secondary | ICD-10-CM

## 2020-07-07 MED ORDER — OXYCODONE HCL 5 MG PO TABS: 5.0000 mg | ORAL_TABLET | ORAL | 0 refills | Status: AC | PRN

## 2020-07-07 MED ORDER — METHYLPREDNISOLONE ACETATE 80 MG/ML IJ SUSP
80.0000 mg | Freq: Once | INTRAMUSCULAR | Status: AC
  Administered 2020-07-07: 80 mg via INTRAMUSCULAR

## 2020-07-07 MED ORDER — TIZANIDINE HCL 2 MG PO TABS: 2.0000 mg | ORAL_TABLET | Freq: Three times a day (TID) | ORAL | 1 refills | Status: DC | PRN

## 2020-07-07 NOTE — Progress Notes (Signed)
Subjective:  Patient ID: [name], female    DOB: [birth year]  Age: 79 y.o. MRN: [id number]  CC: Neck Pain  This visit occurred during the SARS-CoV-2 public health emergency.  Safety protocols were in place, including screening questions prior to the visit, additional usage of staff PPE, and extensive cleaning of exam room while observing appropriate contact time as indicated for disinfecting solutions.    HPI [name] presents for the complaint of a 5 day hx of neck pain.  She denies trauma or injury.  The pain is worse on the left than the right.  The pain radiates towards her shoulders but not into her upper extremities.  She is not gotten much symptom relief with ibuprofen 400 mg 3-4 times a day.  She denies paresthesias or ataxia.  Outpatient Medications Prior to Visit  Medication Sig Dispense Refill   ALPRAZolam (XANAX) 0.25 MG tablet TAKE ONE TABLET BY MOUTH THREE TIMES DAILY AS NEEDED FOR SLEEP OR ANXIETY 180 tablet 1   aspirin 81 MG tablet Take 81 mg by mouth daily.      CALCIUM PO Take 1-2 capsules by mouth at bedtime.      carvedilol (COREG) 3.125 MG tablet Take 1 tablet (3.125 mg total) by mouth daily. TAKE 1 TABLET BY MOUTH TWICE DAILY WITH A MEAL 90 tablet 1   Cholecalciferol (VITAMIN D3) 5000 UNITS TABS Take 1 tablet by mouth daily.      Cyanocobalamin (VITAMIN B-12 CR) 1500 MCG TBCR Take 1 tablet by mouth 3 (three) times a week.      linaclotide (LINZESS) 290 MCG CAPS capsule Take 1 capsule (290 mcg total) by mouth daily before breakfast. 105 capsule 0   LIVALO 2 MG TABS Take 1 tablet by mouth daily.     Nutritional Supplements (DHEA PO) Take 5 mg by mouth. 5 days per week     Omega-3 Fatty Acids (FISH OIL) 1000 MG CAPS Take 2 capsules by mouth daily.      pantoprazole (PROTONIX) 40 MG tablet Take 1 tablet (40 mg total) by mouth daily as needed (acid reflux). 90 tablet 1   thyroid (NP THYROID) 60 MG tablet Take 1 tablet (60 mg total) by mouth daily  before breakfast. 90 tablet 1   No facility-administered medications prior to visit.    ROS Review of Systems  Constitutional: Negative.  Negative for appetite change, diaphoresis, fatigue and unexpected weight change.  HENT: Negative.  Negative for trouble swallowing.   Eyes: Negative.   Respiratory: Negative for cough, chest tightness, shortness of breath and wheezing.   Cardiovascular: Negative for chest pain, palpitations and leg swelling.  Gastrointestinal: Negative for abdominal pain, constipation, diarrhea, nausea and vomiting.  Genitourinary: Negative.  Negative for difficulty urinating.  Musculoskeletal: Positive for neck pain. Negative for arthralgias, back pain and myalgias.  Skin: Negative.   Neurological: Negative for dizziness, weakness, light-headedness and numbness.  Hematological: Negative for adenopathy. Does not bruise/bleed easily.  Psychiatric/Behavioral: Negative.     Objective:  BP 126/76 (BP Location: Left Arm, Patient Position: Sitting, Cuff Size: Normal)    Pulse 76    Temp 97.8 F (36.6 C) (Oral)    Resp 16    Ht 5' (1.524 m)    Wt 127 lb 6 oz (57.8 kg)    SpO2 96%    BMI 24.88 kg/m   BP Readings from Last 3 Encounters:  07/07/20 126/76  05/28/20 118/68  05/20/20 140/72    Wt Readings from Last  3 Encounters:  07/07/20 127 lb 6 oz (57.8 kg)  05/28/20 129 lb (58.5 kg)  05/20/20 128 lb (58.1 kg)    Physical Exam Vitals reviewed.  Constitutional:      General: She is not in acute distress.    Appearance: She is not ill-appearing or toxic-appearing.  HENT:     Nose: Nose normal.     Mouth/Throat:     Mouth: Mucous membranes are moist.  Eyes:     General: No scleral icterus.    Conjunctiva/sclera: Conjunctivae normal.  Cardiovascular:     Rate and Rhythm: Normal rate and regular rhythm.     Heart sounds: No murmur heard.   Pulmonary:     Effort: Pulmonary effort is normal.     Breath sounds: No stridor. No wheezing, rhonchi or rales.    Abdominal:     General: Abdomen is flat. Bowel sounds are normal. There is no distension.     Palpations: Abdomen is soft. There is no hepatomegaly, splenomegaly or mass.     Tenderness: There is no abdominal tenderness.  Musculoskeletal:        General: Normal range of motion.     Cervical back: No signs of trauma, rigidity or tenderness. No pain with movement or muscular tenderness.  Skin:    General: Skin is warm and dry.     Coloration: Skin is not pale.  Neurological:     General: No focal deficit present.     Mental Status: She is alert and oriented to person, place, and time. Mental status is at baseline.     Cranial Nerves: No cranial nerve deficit.     Motor: Motor function is intact. No weakness.     Coordination: Coordination is intact. Coordination normal.     Deep Tendon Reflexes: Reflexes normal.     Reflex Scores:      Tricep reflexes are 1+ on the right side and 1+ on the left side.      Bicep reflexes are 1+ on the right side and 1+ on the left side.      Brachioradialis reflexes are 1+ on the right side and 1+ on the left side.      Patellar reflexes are 1+ on the right side and 1+ on the left side.      Achilles reflexes are 1+ on the right side and 1+ on the left side.    Lab Results  Component Value Date   WBC 4.6 05/20/2020   HGB 13.1 05/20/2020   HCT 37.8 05/20/2020   PLT 244.0 05/20/2020   GLUCOSE 85 01/14/2020   CHOL 202 (H) 04/17/2020   TRIG 66.0 04/17/2020   HDL 65.20 04/17/2020   LDLDIRECT 130.0 08/03/2011   LDLCALC 124 (H) 04/17/2020   ALT 15 06/06/2019   AST 19 06/06/2019   NA 139 01/14/2020   K 4.1 01/14/2020   CL 105 01/14/2020   CREATININE 0.73 01/14/2020   BUN 6 01/14/2020   CO2 26 01/14/2020   TSH 0.52 01/14/2020   HGBA1C 5.5 06/06/2019    DG Chest 2 View  Result Date: 08/20/2019 CLINICAL DATA:  Thoracic spine pain radiating into the chest for 3 weeks. EXAM: CHEST - 2 VIEW COMPARISON:  PA and lateral chest 04/05/2014. FINDINGS:  Lungs are clear. Heart size is normal. No pneumothorax or pleural fluid. No acute or focal bony abnormality. Multilevel anterior endplate spurring appears unchanged. IMPRESSION: No acute disease. 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 Electronically Signed  By: Inge Rise M.D.   On: 08/20/2019 14:07   DG Thoracic Spine W/Swimmers  Result Date: 08/20/2019 CLINICAL DATA:  Chest pain, no known injury, initial encounter EXAM: THORACIC SPINE - 3 VIEWS COMPARISON:  04/15/2011 FINDINGS: Vertebral body height is well maintained. Multilevel osteophytic changes are seen. No acute compression deformity is noted. No paraspinal mass is seen. IMPRESSION: Degenerative change without acute abnormality. Electronically Signed   By: Inez Catalina M.D.   On: 08/20/2019 14:15  No results found.   Assessment & Plan:   Shritha was seen today for neck pain.  Diagnoses and all orders for this visit:  Neck pain on left side- Based on her symptoms, exam, and plain films she has cervical DDD with spinal stenosis.  Fortunately, she is neurologically intact.  She does not want to pursue treatment options like epidural steroid injections, surgery, or physical therapy.  She has not gotten much symptom relief with ibuprofen so I recommended an injection of methylprednisolone to reduce the pain and inflammation and then tizanidine and oxycodone as needed for the discomfort. -     DG Cervical Spine Complete; Future  Spinal stenosis of cervical region -     tiZANidine (ZANAFLEX) 2 MG tablet; Take 1 tablet (2 mg total) by mouth every 8 (eight) hours as needed for muscle spasms. -     oxyCODONE (OXY IR/ROXICODONE) 5 MG immediate release tablet; Take 1 tablet (5 mg total) by mouth every 4 (four) hours as needed for up to 5 days for severe pain. -     methylPREDNISolone acetate (DEPO-MEDROL) injection 80 mg  DDD (degenerative disc disease), cervical -     tiZANidine (ZANAFLEX) 2 MG tablet; Take 1 tablet (2 mg  total) by mouth every 8 (eight) hours as needed for muscle spasms. -     oxyCODONE (OXY IR/ROXICODONE) 5 MG immediate release tablet; Take 1 tablet (5 mg total) by mouth every 4 (four) hours as needed for up to 5 days for severe pain. -     methylPREDNISolone acetate (DEPO-MEDROL) injection 80 mg   I am having Semira P. Haser start on tiZANidine and oxyCODONE. I am also having her maintain her Vitamin B-12 CR, Fish Oil, Vitamin D3, CALCIUM PO, aspirin, Nutritional Supplements (DHEA PO), pantoprazole, ALPRAZolam, carvedilol, thyroid, Livalo, and linaclotide. We administered methylPREDNISolone acetate.  Meds ordered this encounter  Medications   tiZANidine (ZANAFLEX) 2 MG tablet    Sig: Take 1 tablet (2 mg total) by mouth every 8 (eight) hours as needed for muscle spasms.    Dispense:  90 tablet    Refill:  1   oxyCODONE (OXY IR/ROXICODONE) 5 MG immediate release tablet    Sig: Take 1 tablet (5 mg total) by mouth every 4 (four) hours as needed for up to 5 days for severe pain.    Dispense:  30 tablet    Refill:  0   methylPREDNISolone acetate (DEPO-MEDROL) injection 80 mg     Follow-up: Return in about 6 weeks (around 08/18/2020).  Scarlette Calico, MD

## 2020-07-07 NOTE — Patient Instructions (Signed)
Spinal Stenosis  Spinal stenosis occurs when the open space (spinal canal) between the bones of your spine (vertebrae) narrows, putting pressure on the spinal cord or nerves. What are the causes? This condition is caused by areas of bone pushing into the central canals of your vertebrae. This condition may be present at birth (congenital), or it may be caused by:  Arthritic deterioration of your vertebrae (spinal degeneration). This usually starts around age 50.  Injury or trauma to the spine.  Tumors in the spine.  Calcium deposits in the spine. What are the signs or symptoms? Symptoms of this condition include:  Pain in the neck or back that is generally worse with activities, particularly when standing and walking.  Numbness, tingling, hot or cold sensations, weakness, or weariness in your legs.  Pain going up and down the leg (sciatica).  Frequent episodes of falling.  A foot-slapping gait that leads to muscle weakness. In more serious cases, you may develop:  Problemspassing stool or passing urine.  Difficulty having sex.  Loss of feeling in part or all of your leg. Symptoms may come on slowly and get worse over time. How is this diagnosed? This condition is diagnosed based on your medical history and a physical exam. Tests will also be done, such as:  MRI.  CT scan.  X-ray. How is this treated? Treatment for this condition often focuses on managing your pain and any other symptoms. Treatment may include:  Practicing good posture to lessen pressure on your nerves.  Exercising to strengthen muscles, build endurance, improve balance, and maintain good joint movement (range of motion).  Losing weight, if needed.  Taking medicines to reduce swelling, inflammation, or pain.  Assistive devices, such as a corset or brace. In some cases, surgery may be needed. The most common procedure is decompression laminectomy. This is done to remove excess bone that puts  pressure on your nerve roots. Follow these instructions at home: Managing pain, stiffness, and swelling  Do all exercises and stretches as told by your health care provider.  Practice good posture. If you were given a brace or a corset, wear it as told by your health care provider.  Do not do any activities that cause pain. Ask your health care provider what activities are safe for you.  Do not lift anything that is heavier than 10 lb (4.5 kg) or the limit that your health care provider tells you.  Maintain a healthy weight. Talk with your health care provider if you need help losing weight.  If directed, apply heat to the affected area as often as told by your health care provider. Use the heat source that your health care provider recommends, such as a moist heat pack or a heating pad. ? Place a towel between your skin and the heat source. ? Leave the heat on for 20-30 minutes. ? Remove the heat if your skin turns bright red. This is especially important if you are not able to feel pain, heat, or cold. You may have a greater risk of getting burned. General instructions  Take over-the-counter and prescription medicines only as told by your health care provider.  Do not use any products that contain nicotine or tobacco, such as cigarettes and e-cigarettes. If you need help quitting, ask your health care provider.  Eat a healthy diet. This includes plenty of fruits and vegetables, whole grains, and low-fat (lean) protein.  Keep all follow-up visits as told by your health care provider. This is important. Contact   a health care provider if:  Your symptoms do not get better or they get worse.  You have a fever. Get help right away if:  You have new or worse pain in your neck or upper back.  You have severe pain that cannot be controlled with medicines.  You are dizzy.  You have vision problems, blurred vision, or double vision.  You have a severe headache that is worse when you  stand.  You have nausea or you vomit.  You develop new or worse numbness or tingling in your back or legs.  You have pain, redness, swelling, or warmth in your arm or leg. Summary  Spinal stenosis occurs when the open space (spinal canal) between the bones of your spine (vertebrae) narrows. This narrowing puts pressure on the spinal cord or nerves.  Spinal stenosis can cause numbness, weakness, or pain in the neck, back, and legs.  This condition may be caused by a birth defect, arthritic deterioration of your vertebrae, injury, tumors, or calcium deposits.  This condition is usually diagnosed with MRIs, CT scans, and X-rays. This information is not intended to replace advice given to you by your health care provider. Make sure you discuss any questions you have with your health care provider. Document Revised: 10/21/2017 Document Reviewed: 10/13/2016 Elsevier Patient Education  2020 [employer]  

## 2020-07-08 ENCOUNTER — Ambulatory Visit: Payer: Medicare Other | Admitting: Internal Medicine

## 2020-08-31 ENCOUNTER — Other Ambulatory Visit: Payer: Self-pay | Admitting: Internal Medicine

## 2020-08-31 DIAGNOSIS — R000 Tachycardia, unspecified: Secondary | ICD-10-CM

## 2020-09-01 ENCOUNTER — Encounter: Payer: Self-pay | Admitting: Internal Medicine

## 2020-09-01 ENCOUNTER — Other Ambulatory Visit: Payer: Self-pay

## 2020-09-01 ENCOUNTER — Ambulatory Visit (INDEPENDENT_AMBULATORY_CARE_PROVIDER_SITE_OTHER): Payer: Medicare Other | Admitting: Internal Medicine

## 2020-09-01 DIAGNOSIS — E785 Hyperlipidemia, unspecified: Secondary | ICD-10-CM | POA: Diagnosis not present

## 2020-09-01 DIAGNOSIS — Z23 Encounter for immunization: Secondary | ICD-10-CM

## 2020-09-01 DIAGNOSIS — K581 Irritable bowel syndrome with constipation: Secondary | ICD-10-CM

## 2020-09-01 DIAGNOSIS — I10 Essential (primary) hypertension: Secondary | ICD-10-CM

## 2020-09-01 DIAGNOSIS — E039 Hypothyroidism, unspecified: Secondary | ICD-10-CM | POA: Diagnosis not present

## 2020-09-01 LAB — LIPID PANEL
Cholesterol: 161 mg/dL (ref 0–200)
HDL: 77.9 mg/dL (ref >39.00)
LDL Cholesterol: 69 mg/dL (ref 0–99)
NonHDL: 83.4
Total CHOL/HDL Ratio: 2
Triglycerides: 73 mg/dL (ref 0.0–149.0)
VLDL: 14.6 mg/dL (ref 0.0–40.0)

## 2020-09-01 LAB — MAGNESIUM: Magnesium: 2 mg/dL (ref 1.5–2.5)

## 2020-09-01 LAB — BASIC METABOLIC PANEL
BUN: 13 mg/dL (ref 6–23)
CO2: 31 mEq/L (ref 19–32)
Calcium: 9.6 mg/dL (ref 8.4–10.5)
Chloride: 100 mEq/L (ref 96–112)
Creatinine, Ser: 0.77 mg/dL (ref 0.40–1.20)
GFR: 73.62 mL/min (ref >60.00)
Glucose, Bld: 108 mg/dL — ABNORMAL HIGH (ref 70–99)
Potassium: 3.9 mEq/L (ref 3.5–5.1)
Sodium: 139 mEq/L (ref 135–145)

## 2020-09-01 NOTE — Patient Instructions (Signed)
Hypothyroidism  Hypothyroidism is when the thyroid gland does not make enough of certain hormones (it is underactive). The thyroid gland is a small gland located in the lower front part of the neck, just in front of the windpipe (trachea). This gland makes hormones that help control how the body uses food for energy (metabolism) as well as how the heart and brain function. These hormones also play a role in keeping your bones strong. When the thyroid is underactive, it produces too little of the hormones thyroxine (T4) and triiodothyronine (T3). What are the causes? This condition may be caused by:  Hashimoto's disease. This is a disease in which the body's disease-fighting system (immune system) attacks the thyroid gland. This is the most common cause.  Viral infections.  Pregnancy.  Certain medicines.  Birth defects.  Past radiation treatments to the head or neck for cancer.  Past treatment with radioactive iodine.  Past exposure to radiation in the environment.  Past surgical removal of part or all of the thyroid.  Problems with a gland in the center of the brain (pituitary gland).  Lack of enough iodine in the diet. What increases the risk? You are more likely to develop this condition if:  You are female.  You have a family history of thyroid conditions.  You use a medicine called lithium.  You take medicines that affect the immune system (immunosuppressants). What are the signs or symptoms? Symptoms of this condition include:  Feeling as though you have no energy (lethargy).  Not being able to tolerate cold.  Weight gain that is not explained by a change in diet or exercise habits.  Lack of appetite.  Dry skin.  Coarse hair.  Menstrual irregularity.  Slowing of thought processes.  Constipation.  Sadness or depression. How is this diagnosed? This condition may be diagnosed based on:  Your symptoms, your medical history, and a physical exam.  Blood  tests. You may also have imaging tests, such as an ultrasound or MRI. How is this treated? This condition is treated with medicine that replaces the thyroid hormones that your body does not make. After you begin treatment, it may take several weeks for symptoms to go away. Follow these instructions at home:  Take over-the-counter and prescription medicines only as told by your health care provider.  If you start taking any new medicines, tell your health care provider.  Keep all follow-up visits as told by your health care provider. This is important. ? As your condition improves, your dosage of thyroid hormone medicine may change. ? You will need to have blood tests regularly so that your health care provider can monitor your condition. Contact a health care provider if:  Your symptoms do not get better with treatment.  You are taking thyroid replacement medicine and you: ? Sweat a lot. ? Have tremors. ? Feel anxious. ? Lose weight rapidly. ? Cannot tolerate heat. ? Have emotional swings. ? Have diarrhea. ? Feel weak. Get help right away if you have:  Chest pain.  An irregular heartbeat.  A rapid heartbeat.  Difficulty breathing. Summary  Hypothyroidism is when the thyroid gland does not make enough of certain hormones (it is underactive).  When the thyroid is underactive, it produces too little of the hormones thyroxine (T4) and triiodothyronine (T3).  The most common cause is Hashimoto's disease, a disease in which the body's disease-fighting system (immune system) attacks the thyroid gland. The condition can also be caused by viral infections, medicine, pregnancy, or past   radiation treatment to the head or neck.  Symptoms may include weight gain, dry skin, constipation, feeling as though you do not have energy, and not being able to tolerate cold.  This condition is treated with medicine to replace the thyroid hormones that your body does not make. This information  is not intended to replace advice given to you by your health care provider. Make sure you discuss any questions you have with your health care provider. Document Revised: 10/21/2017 Document Reviewed: 10/19/2017 [employer] Patient Education  2020 [employer]  

## 2020-09-01 NOTE — Progress Notes (Signed)
Subjective:  Patient ID: [name], female    DOB: Jun 30, 1941  Age: 79 y.o. MRN: [id number]  CC: Hypothyroidism  This visit occurred during the SARS-CoV-2 public health emergency.  Safety protocols were in place, including screening questions prior to the visit, additional usage of staff PPE, and extensive cleaning of exam room while observing appropriate contact time as indicated for disinfecting solutions.    HPI [name] presents for f/up - She has the usual complaints of constipation and fatigue. She is active and does not experience CP, DOE, palpitations, or edema.  Outpatient Medications Prior to Visit  Medication Sig Dispense Refill  . ALPRAZolam (XANAX) 0.25 MG tablet TAKE ONE TABLET BY MOUTH THREE TIMES DAILY AS NEEDED FOR SLEEP OR ANXIETY 180 tablet 1  . aspirin 81 MG tablet Take 81 mg by mouth daily.     Marland Kitchen CALCIUM PO Take 1-2 capsules by mouth at bedtime.     . carvedilol (COREG) 3.125 MG tablet TAKE 1 TABLET BY MOUTH TWICE DAILY WITH A MEAL 180 tablet 0  . Cholecalciferol (VITAMIN D3) 5000 UNITS TABS Take 1 tablet by mouth daily.     . Cyanocobalamin (VITAMIN B-12 CR) 1500 MCG TBCR Take 1 tablet by mouth 3 (three) times a week.     Marland Kitchen LIVALO 2 MG TABS Take 1 tablet by mouth daily.    . Nutritional Supplements (DHEA PO) Take 5 mg by mouth. 5 days per week    . Omega-3 Fatty Acids (FISH OIL) 1000 MG CAPS Take 2 capsules by mouth daily.     . pantoprazole (PROTONIX) 40 MG tablet Take 1 tablet (40 mg total) by mouth daily as needed (acid reflux). 90 tablet 1  . linaclotide (LINZESS) 290 MCG CAPS capsule Take 1 capsule (290 mcg total) by mouth daily before breakfast. 105 capsule 0  . thyroid (NP THYROID) 60 MG tablet Take 1 tablet (60 mg total) by mouth daily before breakfast. 90 tablet 1  . tiZANidine (ZANAFLEX) 2 MG tablet Take 1 tablet (2 mg total) by mouth every 8 (eight) hours as needed for muscle spasms. 90 tablet 1   No facility-administered medications prior  to visit.    ROS Review of Systems  Constitutional: Positive for fatigue. Negative for appetite change, diaphoresis and unexpected weight change.  HENT: Negative.   Eyes: Negative for visual disturbance.  Respiratory: Negative for cough, chest tightness, shortness of breath and wheezing.   Cardiovascular: Negative for chest pain, palpitations and leg swelling.  Gastrointestinal: Negative for abdominal pain, constipation, diarrhea, nausea and vomiting.  Endocrine: Negative for cold intolerance and heat intolerance.  Genitourinary: Negative.  Negative for difficulty urinating.  Musculoskeletal: Negative for arthralgias and myalgias.  Skin: Negative.  Negative for color change and pallor.  Neurological: Negative.  Negative for dizziness, weakness and headaches.  Hematological: Negative for adenopathy. Does not bruise/bleed easily.  Psychiatric/Behavioral: Negative.     Objective:  BP 112/74   Pulse 83   Temp 97.9 F (36.6 C) (Oral)   Resp 16   Ht 5' (1.524 m)   Wt 126 lb (57.2 kg)   SpO2 92%   BMI 24.61 kg/m   BP Readings from Last 3 Encounters:  09/01/20 112/74  07/07/20 126/76  05/28/20 118/68    Wt Readings from Last 3 Encounters:  09/01/20 126 lb (57.2 kg)  07/07/20 127 lb 6 oz (57.8 kg)  05/28/20 129 lb (58.5 kg)    Physical Exam Vitals reviewed.  HENT:  Nose: Nose normal.     Mouth/Throat:     Mouth: Mucous membranes are moist.  Eyes:     General: No scleral icterus.    Conjunctiva/sclera: Conjunctivae normal.  Cardiovascular:     Rate and Rhythm: Normal rate and regular rhythm.     Heart sounds: No murmur heard.   Pulmonary:     Effort: Pulmonary effort is normal.     Breath sounds: No stridor. No wheezing, rhonchi or rales.  Abdominal:     General: Abdomen is flat.     Palpations: There is no mass.     Tenderness: There is no abdominal tenderness. There is no guarding.  Musculoskeletal:        General: Normal range of motion.     Cervical  back: Neck supple.     Right lower leg: No edema.     Left lower leg: No edema.  Lymphadenopathy:     Cervical: No cervical adenopathy.  Skin:    General: Skin is warm and dry.     Coloration: Skin is not pale.  Neurological:     General: No focal deficit present.     Mental Status: She is alert.  Psychiatric:        Mood and Affect: Mood normal.        Behavior: Behavior normal.     Lab Results  Component Value Date   WBC 4.6 05/20/2020   HGB 13.1 05/20/2020   HCT 37.8 05/20/2020   PLT 244.0 05/20/2020   GLUCOSE 108 (H) 09/01/2020   CHOL 161 09/01/2020   TRIG 73.0 09/01/2020   HDL 77.90 09/01/2020   LDLDIRECT 130.0 08/03/2011   LDLCALC 69 09/01/2020   ALT 15 06/06/2019   AST 19 06/06/2019   NA 139 09/01/2020   K 3.9 09/01/2020   CL 100 09/01/2020   CREATININE 0.77 09/01/2020   BUN 13 09/01/2020   CO2 31 09/01/2020   TSH 0.22 (L) 09/01/2020   HGBA1C 5.5 06/06/2019    DG Chest 2 View  Result Date: 08/20/2019 CLINICAL DATA:  Thoracic spine pain radiating into the chest for 3 weeks. EXAM: CHEST - 2 VIEW COMPARISON:  PA and lateral chest 04/05/2014. FINDINGS: Lungs are clear. Heart size is normal. No pneumothorax or pleural fluid. No acute or focal bony abnormality. Multilevel anterior endplate spurring appears unchanged. IMPRESSION: No acute disease. 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 Electronically Signed   By: Inge Rise M.D.   On: 08/20/2019 14:07   DG Thoracic Spine W/Swimmers  Result Date: 08/20/2019 CLINICAL DATA:  Chest pain, no known injury, initial encounter EXAM: THORACIC SPINE - 3 VIEWS COMPARISON:  04/15/2011 FINDINGS: Vertebral body height is well maintained. Multilevel osteophytic changes are seen. No acute compression deformity is noted. No paraspinal mass is seen. IMPRESSION: Degenerative change without acute abnormality. Electronically Signed   By: Inez Catalina M.D.   On: 08/20/2019 14:15    Assessment & Plan:   Lorisa was seen  today for hypothyroidism.  Diagnoses and all orders for this visit:  Primary hypertension- Her blood pressure is well controlled.  Electrolytes and renal function are normal. -     BASIC METABOLIC PANEL WITH GFR; Future -     Magnesium; Future -     TSH; Future -     Basic metabolic panel; Future -     Basic metabolic panel -     TSH -     Magnesium  Acquired hypothyroidism- Her TSH is suppressed.  I recommended that she lower her dose. -     TSH; Future -     TSH -     thyroid (NP THYROID) 30 MG tablet; Take 1 tablet (30 mg total) by mouth daily before breakfast.  Hyperlipidemia with target LDL less than 130- She has achieved her LDL goal and is doing well on the statin. -     Lipid panel; Future -     TSH; Future -     TSH -     Lipid panel  Irritable bowel syndrome with constipation- Labs are negative for any secondary causes of constipation.  I recommended that she continue taking linaclotide. -     Magnesium; Future -     Magnesium -     linaclotide (LINZESS) 290 MCG CAPS capsule; Take 1 capsule (290 mcg total) by mouth daily before breakfast.  Other orders -     Pneumococcal polysaccharide vaccine 23-valent greater than or equal to 2yo subcutaneous/IM   I have discontinued Izora Gala P. Saindon's thyroid and tiZANidine. I am also having her start on thyroid. Additionally, I am having her maintain her Vitamin B-12 CR, Fish Oil, Vitamin D3, CALCIUM PO, aspirin, Nutritional Supplements (DHEA PO), pantoprazole, ALPRAZolam, Livalo, carvedilol, and linaclotide.  Meds ordered this encounter  Medications  . thyroid (NP THYROID) 30 MG tablet    Sig: Take 1 tablet (30 mg total) by mouth daily before breakfast.    Dispense:  90 tablet    Refill:  0  . linaclotide (LINZESS) 290 MCG CAPS capsule    Sig: Take 1 capsule (290 mcg total) by mouth daily before breakfast.    Dispense:  90 capsule    Refill:  1     Follow-up: Return in about 6 months (around 03/02/2021).  Scarlette Calico,  MD

## 2020-09-02 ENCOUNTER — Encounter: Payer: Self-pay | Admitting: Internal Medicine

## 2020-09-02 LAB — TSH: TSH: 0.22 u[IU]/mL — ABNORMAL LOW (ref 0.35–4.50)

## 2020-09-02 MED ORDER — THYROID 30 MG PO TABS: 30.0000 mg | ORAL_TABLET | Freq: Every day | ORAL | 0 refills | Status: DC

## 2020-09-02 MED ORDER — LINACLOTIDE 290 MCG PO CAPS: 290.0000 ug | ORAL_CAPSULE | Freq: Every day | ORAL | 1 refills | Status: DC

## 2020-10-21 DIAGNOSIS — H00025 Hordeolum internum left lower eyelid: Secondary | ICD-10-CM | POA: Diagnosis not present

## 2020-10-21 DIAGNOSIS — H16142 Punctate keratitis, left eye: Secondary | ICD-10-CM | POA: Diagnosis not present

## 2020-10-21 DIAGNOSIS — H16223 Keratoconjunctivitis sicca, not specified as Sjogren's, bilateral: Secondary | ICD-10-CM | POA: Diagnosis not present

## 2020-10-21 DIAGNOSIS — H40013 Open angle with borderline findings, low risk, bilateral: Secondary | ICD-10-CM | POA: Diagnosis not present

## 2020-10-21 DIAGNOSIS — H0102A Squamous blepharitis right eye, upper and lower eyelids: Secondary | ICD-10-CM | POA: Diagnosis not present

## 2020-11-04 DIAGNOSIS — H00025 Hordeolum internum left lower eyelid: Secondary | ICD-10-CM | POA: Diagnosis not present

## 2020-11-04 DIAGNOSIS — H0102A Squamous blepharitis right eye, upper and lower eyelids: Secondary | ICD-10-CM | POA: Diagnosis not present

## 2020-11-04 DIAGNOSIS — H16142 Punctate keratitis, left eye: Secondary | ICD-10-CM | POA: Diagnosis not present

## 2020-11-04 DIAGNOSIS — H0102B Squamous blepharitis left eye, upper and lower eyelids: Secondary | ICD-10-CM | POA: Diagnosis not present

## 2020-11-04 DIAGNOSIS — H16223 Keratoconjunctivitis sicca, not specified as Sjogren's, bilateral: Secondary | ICD-10-CM | POA: Diagnosis not present

## 2020-11-26 ENCOUNTER — Other Ambulatory Visit: Payer: Self-pay

## 2020-11-26 ENCOUNTER — Ambulatory Visit (INDEPENDENT_AMBULATORY_CARE_PROVIDER_SITE_OTHER): Payer: Medicare Other | Admitting: Internal Medicine

## 2020-11-26 ENCOUNTER — Encounter: Payer: Self-pay | Admitting: Internal Medicine

## 2020-11-26 VITALS — BP 122/76 | HR 68 | Temp 98.5°F | Resp 16 | Ht 60.0 in | Wt 128.0 lb

## 2020-11-26 DIAGNOSIS — I10 Essential (primary) hypertension: Secondary | ICD-10-CM

## 2020-11-26 DIAGNOSIS — Z0000 Encounter for general adult medical examination without abnormal findings: Secondary | ICD-10-CM | POA: Diagnosis not present

## 2020-11-26 DIAGNOSIS — E039 Hypothyroidism, unspecified: Secondary | ICD-10-CM

## 2020-11-26 DIAGNOSIS — E785 Hyperlipidemia, unspecified: Secondary | ICD-10-CM | POA: Diagnosis not present

## 2020-11-26 LAB — BASIC METABOLIC PANEL
BUN: 11 mg/dL (ref 6–23)
CO2: 30 mEq/L (ref 19–32)
Calcium: 9.9 mg/dL (ref 8.4–10.5)
Chloride: 103 mEq/L (ref 96–112)
Creatinine, Ser: 0.79 mg/dL (ref 0.40–1.20)
GFR: 71.41 mL/min (ref >60.00)
Glucose, Bld: 87 mg/dL (ref 70–99)
Potassium: 4.5 mEq/L (ref 3.5–5.1)
Sodium: 139 mEq/L (ref 135–145)

## 2020-11-26 LAB — TSH: TSH: 5.38 u[IU]/mL — ABNORMAL HIGH (ref 0.35–4.50)

## 2020-11-26 LAB — LIPID PANEL
Cholesterol: 167 mg/dL (ref 0–200)
HDL: 76.6 mg/dL (ref >39.00)
LDL Cholesterol: 77 mg/dL (ref 0–99)
NonHDL: 90.02
Total CHOL/HDL Ratio: 2
Triglycerides: 63 mg/dL (ref 0.0–149.0)
VLDL: 12.6 mg/dL (ref 0.0–40.0)

## 2020-11-26 LAB — CK: Total CK: 45 U/L (ref 7–177)

## 2020-11-26 NOTE — Progress Notes (Signed)
Subjective:  Patient ID: [name], female    DOB: 1941-05-11  Age: 80 y.o. MRN: [id number]  CC: Annual Exam, Hypothyroidism, and Hyperlipidemia  This visit occurred during the SARS-CoV-2 public health emergency.  Safety protocols were in place, including screening questions prior to the visit, additional usage of staff PPE, and extensive cleaning of exam room while observing appropriate contact time as indicated for disinfecting solutions.    HPI [name] presents for a CPX.  She complains of mild constipation.  She is active and denies any recent episodes of chest pain, shortness of breath, palpitations, edema, or fatigue.  She is tolerating the statin well with no muscle or joint aches.  Outpatient Medications Prior to Visit  Medication Sig Dispense Refill   ALPRAZolam (XANAX) 0.25 MG tablet TAKE ONE TABLET BY MOUTH THREE TIMES DAILY AS NEEDED FOR SLEEP OR ANXIETY 180 tablet 1   aspirin 81 MG tablet Take 81 mg by mouth daily.      CALCIUM PO Take 1-2 capsules by mouth at bedtime.      carvedilol (COREG) 3.125 MG tablet TAKE 1 TABLET BY MOUTH TWICE DAILY WITH A MEAL 180 tablet 0   Cholecalciferol (VITAMIN D3) 5000 UNITS TABS Take 1 tablet by mouth daily.     Cyanocobalamin (VITAMIN B-12 CR) 1500 MCG TBCR Take 1 tablet by mouth 3 (three) times a week.     Nutritional Supplements (DHEA PO) Take 5 mg by mouth. 5 days per week     Omega-3 Fatty Acids (FISH OIL) 1000 MG CAPS Take 2 capsules by mouth daily.     pantoprazole (PROTONIX) 40 MG tablet Take 1 tablet (40 mg total) by mouth daily as needed (acid reflux). 90 tablet 1   linaclotide (LINZESS) 290 MCG CAPS capsule Take 1 capsule (290 mcg total) by mouth daily before breakfast. 90 capsule 1   LIVALO 2 MG TABS Take 1 tablet by mouth daily.     thyroid (NP THYROID) 30 MG tablet Take 1 tablet (30 mg total) by mouth daily before breakfast. 90 tablet 0   No facility-administered medications prior to visit.     ROS Review of Systems  Constitutional: Negative for appetite change, diaphoresis, fatigue and unexpected weight change.  HENT: Negative.   Eyes: Negative for visual disturbance.  Respiratory: Negative for cough, chest tightness, shortness of breath and wheezing.   Cardiovascular: Negative for chest pain, palpitations and leg swelling.  Gastrointestinal: Positive for constipation. Negative for abdominal pain, nausea and vomiting.  Endocrine: Negative.  Negative for cold intolerance and heat intolerance.  Genitourinary: Negative.  Negative for difficulty urinating.  Musculoskeletal: Negative.  Negative for arthralgias and myalgias.  Skin: Negative.   Neurological: Negative.  Negative for dizziness, weakness, light-headedness and numbness.  Hematological: Negative for adenopathy. Does not bruise/bleed easily.  Psychiatric/Behavioral: Negative.     Objective:  BP 122/76    Pulse 68    Temp 98.5 F (36.9 C) (Oral)    Resp 16    Ht 5' (1.524 m)    Wt 128 lb (58.1 kg)    SpO2 98%    BMI 25.00 kg/m   BP Readings from Last 3 Encounters:  11/26/20 122/76  09/01/20 112/74  07/07/20 126/76    Wt Readings from Last 3 Encounters:  11/26/20 128 lb (58.1 kg)  09/01/20 126 lb (57.2 kg)  07/07/20 127 lb 6 oz (57.8 kg)    Physical Exam Vitals reviewed.  Constitutional:      Appearance: Normal  appearance.  HENT:     Nose: Nose normal.     Mouth/Throat:     Mouth: Mucous membranes are moist.  Eyes:     General: No scleral icterus.    Conjunctiva/sclera: Conjunctivae normal.  Cardiovascular:     Rate and Rhythm: Normal rate and regular rhythm.     Heart sounds: No murmur heard.   Pulmonary:     Effort: Pulmonary effort is normal.     Breath sounds: No stridor. No wheezing, rhonchi or rales.  Abdominal:     General: Abdomen is flat.     Palpations: There is no mass.     Tenderness: There is no abdominal tenderness. There is no guarding.  Musculoskeletal:        General:  Normal range of motion.     Cervical back: Neck supple.     Right lower leg: No edema.     Left lower leg: No edema.  Lymphadenopathy:     Cervical: No cervical adenopathy.  Skin:    General: Skin is warm and dry.     Coloration: Skin is not pale.  Neurological:     General: No focal deficit present.     Mental Status: She is alert.  Psychiatric:        Mood and Affect: Mood normal.        Behavior: Behavior normal.     Lab Results  Component Value Date   WBC 4.6 05/20/2020   HGB 13.1 05/20/2020   HCT 37.8 05/20/2020   PLT 244.0 05/20/2020   GLUCOSE 87 11/26/2020   CHOL 167 11/26/2020   TRIG 63.0 11/26/2020   HDL 76.60 11/26/2020   LDLDIRECT 130.0 08/03/2011   LDLCALC 77 11/26/2020   ALT 15 06/06/2019   AST 19 06/06/2019   NA 139 11/26/2020   K 4.5 11/26/2020   CL 103 11/26/2020   CREATININE 0.79 11/26/2020   BUN 11 11/26/2020   CO2 30 11/26/2020   TSH 5.38 (H) 11/26/2020   HGBA1C 5.5 06/06/2019    DG Chest 2 View  Result Date: 08/20/2019 CLINICAL DATA:  Thoracic spine pain radiating into the chest for 3 weeks. EXAM: CHEST - 2 VIEW COMPARISON:  PA and lateral chest 04/05/2014. FINDINGS: Lungs are clear. Heart size is normal. No pneumothorax or pleural fluid. No acute or focal bony abnormality. Multilevel anterior endplate spurring appears unchanged. IMPRESSION: No acute disease. 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 Electronically Signed   By: Drusilla Kannerhomas  Dalessio M.D.   On: 08/20/2019 14:07   DG Thoracic Spine W/Swimmers  Result Date: 08/20/2019 CLINICAL DATA:  Chest pain, no known injury, initial encounter EXAM: THORACIC SPINE - 3 VIEWS COMPARISON:  04/15/2011 FINDINGS: Vertebral body height is well maintained. Multilevel osteophytic changes are seen. No acute compression deformity is noted. No paraspinal mass is seen. IMPRESSION: Degenerative change without acute abnormality. Electronically Signed   By: Alcide CleverMark  Lukens M.D.   On: 08/20/2019 14:15     Assessment & Plan:   [name] was seen today for annual exam, hypothyroidism and hyperlipidemia.  Diagnoses and all orders for this visit:  Primary hypertension- Her blood pressure is adequately well controlled. -     Basic metabolic panel; Future -     Basic metabolic panel  Acquired hypothyroidism- Her TSH is in the acceptable range and she appears euthyroid.  Will continue the current dose of thyroid replacement therapy. -     TSH; Future -     TSH -  thyroid (NP THYROID) 30 MG tablet; Take 1 tablet (30 mg total) by mouth daily before breakfast.  Hyperlipidemia with target LDL less than 130- She has achieved her LDL goal is doing well on the statin. -     Lipid panel; Future -     CK; Future -     CK -     Lipid panel -     LIVALO 2 MG TABS; Take 1 tablet (2 mg total) by mouth daily.  Routine general medical examination at a health care facility- Exam completed, labs reviewed, vaccines reviewed and updated, no cancer screenings are indicated, patient education was given.   I have discontinued [employer]. Dubois's linaclotide. I have also changed her Livalo. Additionally, I am having her maintain her Vitamin B-12 CR, Fish Oil, Vitamin D3, CALCIUM PO, aspirin, Nutritional Supplements (DHEA PO), pantoprazole, ALPRAZolam, carvedilol, and thyroid.  Meds ordered this encounter  Medications   LIVALO 2 MG TABS    Sig: Take 1 tablet (2 mg total) by mouth daily.    Dispense:  90 tablet    Refill:  1   thyroid (NP THYROID) 30 MG tablet    Sig: Take 1 tablet (30 mg total) by mouth daily before breakfast.    Dispense:  90 tablet    Refill:  1     Follow-up: Return in about 6 months (around 05/26/2021).  Sanda Linger, MD

## 2020-11-26 NOTE — Patient Instructions (Signed)
Health Maintenance, Female Adopting a healthy lifestyle and getting preventive care are important in promoting health and wellness. Ask your health care provider about:  The right schedule for you to have regular tests and exams.  Things you can do on your own to prevent diseases and keep yourself healthy. What should I know about diet, weight, and exercise? Eat a healthy diet   Eat a diet that includes plenty of vegetables, fruits, low-fat dairy products, and lean protein.  Do not eat a lot of foods that are high in solid fats, added sugars, or sodium. Maintain a healthy weight Body mass index (BMI) is used to identify weight problems. It estimates body fat based on height and weight. Your health care provider can help determine your BMI and help you achieve or maintain a healthy weight. Get regular exercise Get regular exercise. This is one of the most important things you can do for your health. Most adults should:  Exercise for at least 150 minutes each week. The exercise should increase your heart rate and make you sweat (moderate-intensity exercise).  Do strengthening exercises at least twice a week. This is in addition to the moderate-intensity exercise.  Spend less time sitting. Even light physical activity can be beneficial. Watch cholesterol and blood lipids Have your blood tested for lipids and cholesterol at 79 years of age, then have this test every 5 years. Have your cholesterol levels checked more often if:  Your lipid or cholesterol levels are high.  You are older than 79 years of age.  You are at high risk for heart disease. What should I know about cancer screening? Depending on your health history and family history, you may need to have cancer screening at various ages. This may include screening for:  Breast cancer.  Cervical cancer.  Colorectal cancer.  Skin cancer.  Lung cancer. What should I know about heart disease, diabetes, and high blood  pressure? Blood pressure and heart disease  High blood pressure causes heart disease and increases the risk of stroke. This is more likely to develop in people who have high blood pressure readings, are of African descent, or are overweight.  Have your blood pressure checked: ? Every 3-5 years if you are 18-39 years of age. ? Every year if you are 40 years old or older. Diabetes Have regular diabetes screenings. This checks your fasting blood sugar level. Have the screening done:  Once every three years after age 40 if you are at a normal weight and have a low risk for diabetes.  More often and at a younger age if you are overweight or have a high risk for diabetes. What should I know about preventing infection? Hepatitis B If you have a higher risk for hepatitis B, you should be screened for this virus. Talk with your health care provider to find out if you are at risk for hepatitis B infection. Hepatitis C Testing is recommended for:  Everyone born from 1945 through 1965.  Anyone with known risk factors for hepatitis C. Sexually transmitted infections (STIs)  Get screened for STIs, including gonorrhea and chlamydia, if: ? You are sexually active and are younger than 79 years of age. ? You are older than 79 years of age and your health care provider tells you that you are at risk for this type of infection. ? Your sexual activity has changed since you were last screened, and you are at increased risk for chlamydia or gonorrhea. Ask your health care provider if   you are at risk.  Ask your health care provider about whether you are at high risk for HIV. Your health care provider may recommend a prescription medicine to help prevent HIV infection. If you choose to take medicine to prevent HIV, you should first get tested for HIV. You should then be tested every 3 months for as long as you are taking the medicine. Pregnancy  If you are about to stop having your period (premenopausal) and  you may become pregnant, seek counseling before you get pregnant.  Take 400 to 800 micrograms (mcg) of folic acid every day if you become pregnant.  Ask for birth control (contraception) if you want to prevent pregnancy. Osteoporosis and menopause Osteoporosis is a disease in which the bones lose minerals and strength with aging. This can result in bone fractures. If you are 65 years old or older, or if you are at risk for osteoporosis and fractures, ask your health care provider if you should:  Be screened for bone loss.  Take a calcium or vitamin D supplement to lower your risk of fractures.  Be given hormone replacement therapy (HRT) to treat symptoms of menopause. Follow these instructions at home: Lifestyle  Do not use any products that contain nicotine or tobacco, such as cigarettes, e-cigarettes, and chewing tobacco. If you need help quitting, ask your health care provider.  Do not use street drugs.  Do not share needles.  Ask your health care provider for help if you need support or information about quitting drugs. Alcohol use  Do not drink alcohol if: ? Your health care provider tells you not to drink. ? You are pregnant, may be pregnant, or are planning to become pregnant.  If you drink alcohol: ? Limit how much you use to 0-1 drink a day. ? Limit intake if you are breastfeeding.  Be aware of how much alcohol is in your drink. In the [place] one drink equals one 12 oz bottle of beer (355 mL), one 5 oz glass of wine (148 mL), or one 1 oz glass of hard liquor (44 mL). General instructions  Schedule regular health, dental, and eye exams.  Stay current with your vaccines.  Tell your health care provider if: ? You often feel depressed. ? You have ever been abused or do not feel safe at home. Summary  Adopting a healthy lifestyle and getting preventive care are important in promoting health and wellness.  Follow your health care provider's instructions about healthy  diet, exercising, and getting tested or screened for diseases.  Follow your health care provider's instructions on monitoring your cholesterol and blood pressure. This information is not intended to replace advice given to you by your health care provider. Make sure you discuss any questions you have with your health care provider. Document Revised: 11/01/2018 Document Reviewed: 11/01/2018 Elsevier Patient Education  2020 Elsevier Inc.  

## 2020-11-27 ENCOUNTER — Other Ambulatory Visit: Payer: Self-pay | Admitting: Internal Medicine

## 2020-11-27 DIAGNOSIS — E039 Hypothyroidism, unspecified: Secondary | ICD-10-CM

## 2020-11-27 MED ORDER — LIVALO 2 MG PO TABS
1.0000 | ORAL_TABLET | Freq: Every day | ORAL | 1 refills | Status: DC
Start: 2020-11-27 — End: 2021-02-02

## 2020-11-27 MED ORDER — THYROID 30 MG PO TABS: 30.0000 mg | ORAL_TABLET | Freq: Every day | ORAL | 1 refills | Status: DC

## 2021-01-02 DIAGNOSIS — Z9289 Personal history of other medical treatment: Secondary | ICD-10-CM | POA: Insufficient documentation

## 2021-01-06 DIAGNOSIS — N6310 Unspecified lump in the right breast, unspecified quadrant: Secondary | ICD-10-CM | POA: Diagnosis not present

## 2021-01-06 DIAGNOSIS — Z803 Family history of malignant neoplasm of breast: Secondary | ICD-10-CM | POA: Diagnosis not present

## 2021-01-06 DIAGNOSIS — N644 Mastodynia: Secondary | ICD-10-CM | POA: Diagnosis not present

## 2021-02-02 ENCOUNTER — Other Ambulatory Visit: Payer: Self-pay | Admitting: Internal Medicine

## 2021-02-02 DIAGNOSIS — E785 Hyperlipidemia, unspecified: Secondary | ICD-10-CM

## 2021-02-04 DIAGNOSIS — N644 Mastodynia: Secondary | ICD-10-CM | POA: Diagnosis not present

## 2021-02-04 DIAGNOSIS — N6311 Unspecified lump in the right breast, upper outer quadrant: Secondary | ICD-10-CM | POA: Diagnosis not present

## 2021-02-04 DIAGNOSIS — N6001 Solitary cyst of right breast: Secondary | ICD-10-CM | POA: Diagnosis not present

## 2021-03-25 ENCOUNTER — Telehealth: Payer: Self-pay | Admitting: Internal Medicine

## 2021-03-25 NOTE — Progress Notes (Signed)
  Chronic Care Management   Note  03/25/2021 Name: [name] MRN: [id number] DOB: [birth year]  [name] is a 80 y.o. year old female who is a primary care patient of Janith Lima, MD. I reached out to [name] by phone today in response to a referral sent by Ms. [name] Amos Africa's PCP, Janith Lima, MD.   [name] was given information about Chronic Care Management services today including:  1. CCM service includes personalized support from designated clinical staff supervised by her physician, including individualized plan of care and coordination with other care providers 2. 24/7 contact phone numbers for assistance for urgent and routine care needs. 3. Service will only be billed when office clinical staff spend 20 minutes or more in a month to coordinate care. 4. Only one practitioner may furnish and bill the service in a calendar month. 5. The patient may stop CCM services at any time (effective at the end of the month) by phone call to the office staff.   Patient agreed to services and verbal consent obtained.   Follow up plan:   Carley Perdue [employer] Scheduler

## 2021-04-03 ENCOUNTER — Other Ambulatory Visit: Payer: Self-pay | Admitting: Internal Medicine

## 2021-04-03 DIAGNOSIS — R000 Tachycardia, unspecified: Secondary | ICD-10-CM

## 2021-04-28 ENCOUNTER — Telehealth: Payer: Self-pay | Admitting: Pharmacist

## 2021-04-28 NOTE — Progress Notes (Signed)
Chronic Care Management Pharmacy Assistant   Name: [name]  MRN: [id number] DOB: 01-26-1941  Reason for Encounter: Initial Questions Appointments: OV 04/30/21 @ 11 am  Recent office visits:  11/26/20 [name] (PCP) - Annual Exam. Start back taking Livalo 2mg . D/c Linaclotide. F/u 6 mos.   Recent consult visits:  11/04/20 [name] (Ophthalmology) - Hordeolum internum left lower eyelid  Hospital visits:  None in previous 6 months  Medications: Outpatient Encounter Medications as of 04/28/2021  Medication Sig Note  . ALPRAZolam (XANAX) 0.25 MG tablet TAKE ONE TABLET BY MOUTH THREE TIMES DAILY AS NEEDED FOR SLEEP OR ANXIETY   . aspirin 81 MG tablet Take 81 mg by mouth daily.    Marland Kitchen CALCIUM PO Take 1-2 capsules by mouth at bedtime.  04/19/2015: .  . carvedilol (COREG) 3.125 MG tablet TAKE 1 TABLET BY MOUTH TWICE DAILY WITH A MEAL   . Cholecalciferol (VITAMIN D3) 5000 UNITS TABS Take 1 tablet by mouth daily. 04/19/2015: .  Marland Kitchen Cyanocobalamin (VITAMIN B-12 CR) 1500 MCG TBCR Take 1 tablet by mouth 3 (three) times a week. 04/19/2015: .  Marland Kitchen LIVALO 2 MG TABS Take 1 tablet by mouth once daily   . Nutritional Supplements (DHEA PO) Take 5 mg by mouth. 5 days per week   . Omega-3 Fatty Acids (FISH OIL) 1000 MG CAPS Take 2 capsules by mouth daily. 04/19/2015: .  . pantoprazole (PROTONIX) 40 MG tablet Take 1 tablet (40 mg total) by mouth daily as needed (acid reflux).   Marland Kitchen thyroid (NP THYROID) 30 MG tablet Take 1 tablet (30 mg total) by mouth daily before breakfast.    No facility-administered encounter medications on file as of 04/28/2021.    Have you seen any other providers since your last visit?  Patient states she seen her dentist and eye doctor. On Feb.15, 2022 patient states she seen Dr.[name] because she felt a lump in her right breast. She had a mammogram and ultrasound on 02/04/21. Patient also states she got her 2nd covid booster.  Any changes in your medications or health?  Patient states she  had stop taking her Livalo because it was causing her her pain.  Any side effects from any medications?  Patient states Livolo cause her hips to hurt sometimes but once she gets up and move around she feels better.  Do you have an symptoms or problems not managed by your medications?  Patient states not at this time.  Any concerns about your health right now?  Patient states no concerns at this time.  Has your provider asked that you check blood pressure, blood sugar, or follow special diet at home?  Patient states she checks her glucose and  BP a couple times per month, her last readings were on 04/20/21 at 108/62 am and 123/65 pm. Her last glucose reading was 98 in April 2022. Patient states she tries to follow a low fat, low sugar diet.  Do you get any type of exercise on a regular basis?  Patient states she used to go to the gym before the pandemic and now she walks 45 mins on her treadmill at home, do sit ups and lift weights 3x a week.  Can you think of a goal you would like to reach for your health?  Patient states she would like to lose 10 lbs.  Do you have any problems getting your medications?  Patient states no problems getting meds.  Is there anything that you would like to discuss  during the appointment?  Patient states no concerns at this time.  Please bring medications and supplements to appointment  Star Rating Drugs: N/A  [name], [place] Clinical Pharmacists Assistant [phone]  Time Spent: [phone]

## 2021-04-30 ENCOUNTER — Other Ambulatory Visit: Payer: Self-pay

## 2021-04-30 ENCOUNTER — Ambulatory Visit (INDEPENDENT_AMBULATORY_CARE_PROVIDER_SITE_OTHER): Payer: Medicare Other | Admitting: Pharmacist

## 2021-04-30 DIAGNOSIS — I10 Essential (primary) hypertension: Secondary | ICD-10-CM | POA: Diagnosis not present

## 2021-04-30 DIAGNOSIS — E039 Hypothyroidism, unspecified: Secondary | ICD-10-CM | POA: Diagnosis not present

## 2021-04-30 DIAGNOSIS — E785 Hyperlipidemia, unspecified: Secondary | ICD-10-CM | POA: Diagnosis not present

## 2021-04-30 DIAGNOSIS — M8580 Other specified disorders of bone density and structure, unspecified site: Secondary | ICD-10-CM

## 2021-04-30 DIAGNOSIS — K219 Gastro-esophageal reflux disease without esophagitis: Secondary | ICD-10-CM

## 2021-04-30 MED ORDER — PANTOPRAZOLE SODIUM 40 MG PO TBEC: 40.0000 mg | DELAYED_RELEASE_TABLET | Freq: Every day | ORAL | 1 refills | Status: DC | PRN

## 2021-04-30 NOTE — Patient Instructions (Signed)
Visit Information  Phone number for Pharmacist: [phone]  Thank you for meeting with me to discuss your medications! I look forward to working with you to achieve your health care goals. Below is a summary of what we talked about during the visit:   Goals Addressed             This Visit's Progress    Manage My Medicine       Timeframe:  Long-Range Goal Priority:  Medium Start Date:      04/30/21                       Expected End Date: 04/30/21                      Follow Up Date Dec 2022   - call for medicine refill 2 or 3 days before it runs out - call if I am sick and can't take my medicine - keep a list of all the medicines I take; vitamins and herbals too  -Apply for [place] (hypercholesterolemia) for Livalo copay assistance   Why is this important?   These steps will help you keep on track with your medicines.   Notes:          Ms. Brotherton was given information about Chronic Care Management services today including:  CCM service includes personalized support from designated clinical staff supervised by her physician, including individualized plan of care and coordination with other care providers 24/7 contact phone numbers for assistance for urgent and routine care needs. Standard insurance, coinsurance, copays and deductibles apply for chronic care management only during months in which we provide at least 20 minutes of these services. Most insurances cover these services at 100%, however patients may be responsible for any copay, coinsurance and/or deductible if applicable. This service may help you avoid the need for more expensive face-to-face services. Only one practitioner may furnish and bill the service in a calendar month. The patient may stop CCM services at any time (effective at the end of the month) by phone call to the office staff.  Patient agreed to services and verbal consent obtained.   Patient verbalizes understanding of instructions  provided today and agrees to view in [place].  Telephone follow up appointment with pharmacy team member scheduled for: 1 year  Charlene Brooke, PharmD, [place], CPP Clinical Pharmacist [facility] [phone]

## 2021-04-30 NOTE — Progress Notes (Signed)
Chronic Care Management Pharmacy Note  04/30/2021 Name:  [name] MRN:  [id number] DOB:  Apr 02, 1941  Summary: -Pt is compliant with medications as prescribed -Pt is struggling to lose weight despite diet/exercise; hypothyroidism may be contributing (last TSH 5.38). Patient is willing to change NP thyroid to Synthroid but wants to discuss with PCP first  Recommendations/Changes made from today's visit: -Consider changing NP thyroid to levothyroxine (equivalent dose is 50 mcg) at next PCP visit   Subjective: [name] is an 80 y.o. year old female who is a primary patient of Janith Lima, MD.  The CCM team was consulted for assistance with disease management and care coordination needs.    Engaged with patient face to face for initial visit in response to provider referral for pharmacy case management and/or care coordination services.   Consent to Services:  The patient was given the following information about Chronic Care Management services today, agreed to services, and gave verbal consent: 1. CCM service includes personalized support from designated clinical staff supervised by the primary care provider, including individualized plan of care and coordination with other care providers 2. 24/7 contact phone numbers for assistance for urgent and routine care needs. 3. Service will only be billed when office clinical staff spend 20 minutes or more in a month to coordinate care. 4. Only one practitioner may furnish and bill the service in a calendar month. 5.The patient may stop CCM services at any time (effective at the end of the month) by phone call to the office staff. 6. The patient will be responsible for cost sharing (co-pay) of up to 20% of the service fee (after annual deductible is met). Patient agreed to services and consent obtained.  Patient Care Team: Janith Lima, MD as PCP - General Fay Records, MD as PCP - Cardiology (Cardiology) Charlton Haws,  [facility] as Pharmacist (Pharmacist)  Recent office visits: 11/26/20 [name] (PCP) - Annual Exam. Start back taking Livalo 33m. D/c Linaclotide. F/u 6 mos.   Recent consult visits: 11/04/20 [name] (Ophthalmology) - Hordeolum internum left lower eyelid   Hospital visits: None in previous 6 months   Objective:  Lab Results  Component Value Date   CREATININE 0.79 11/26/2020   BUN 11 11/26/2020   GFR 71.41 11/26/2020   GFRNONAA 84 (L) 10/13/2014   GFRAA >90 10/13/2014   NA 139 11/26/2020   K 4.5 11/26/2020   CALCIUM 9.9 11/26/2020   CO2 30 11/26/2020   GLUCOSE 87 11/26/2020    Lab Results  Component Value Date/Time   HGBA1C 5.5 06/06/2019 09:06 AM   HGBA1C 5.3 07/29/2015 11:24 AM   GFR 71.41 11/26/2020 09:48 AM   GFR 73.62 09/01/2020 01:55 PM    Last diabetic Eye exam: No results found for: HMDIABEYEEXA  Last diabetic Foot exam: No results found for: HMDIABFOOTEX   Lab Results  Component Value Date   CHOL 167 11/26/2020   HDL 76.60 11/26/2020   LDLCALC 77 11/26/2020   LDLDIRECT 130.0 08/03/2011   TRIG 63.0 11/26/2020   CHOLHDL 2 11/26/2020    Hepatic Function Latest Ref Rng & Units 06/06/2019 03/16/2017 10/13/2014  Total Protein 6.0 - 8.3 g/dL 7.1 6.6 7.1  Albumin 3.5 - 5.2 g/dL 4.5 4.0 3.9  AST 0 - 37 U/L '19 14 19  ' ALT 0 - 35 U/L '15 11 15  ' Alk Phosphatase 39 - 117 U/L 44 36(L) 50  Total Bilirubin 0.2 - 1.2 mg/dL 0.6 0.5 0.4  Bilirubin, Direct  0.0 - 0.3 mg/dL 0.0 - -    Lab Results  Component Value Date/Time   TSH 5.38 (H) 11/26/2020 09:48 AM   TSH 0.22 (L) 09/01/2020 01:54 PM   FREET4 0.94 04/26/2011 12:12 PM    CBC Latest Ref Rng & Units 05/20/2020 08/20/2019 06/06/2019  WBC 4.0 - 10.5 K/uL 4.6 4.7 4.2  Hemoglobin 12.0 - 15.0 g/dL 13.1 14.1 14.6  Hematocrit 36.0 - 46.0 % 37.8 41.9 43.6  Platelets 150.0 - 400.0 K/uL 244.0 211.0 227.0    No results found for: VD25OH  Clinical ASCVD: No  The 10-year ASCVD risk score Mikey Bussing DC Jr., et al., 2013) is: 30.5%   Values  used to calculate the score:     Age: 80 years     Sex: Female     Is Non-Hispanic African American: No     Diabetic: No     Tobacco smoker: No     Systolic Blood Pressure: 038 mmHg     Is BP treated: Yes     HDL Cholesterol: 76.6 mg/dL     Total Cholesterol: 167 mg/dL    Depression screen [facility] 2/9 01/14/2020 06/06/2019 09/14/2018  Decreased Interest 0 0 0  Down, Depressed, Hopeless 0 0 0  PHQ - 2 Score 0 0 0  Altered sleeping - 0 0  Tired, decreased energy - 0 0  Change in appetite - 0 0  Feeling bad or failure about yourself  - 0 0  Trouble concentrating - 0 0  Moving slowly or fidgety/restless - 0 0  Suicidal thoughts - 0 0  PHQ-9 Score - 0 0  Difficult doing work/chores - Not difficult at all -    GAD 7 : Generalized Anxiety Score 03/16/2017  Nervous, Anxious, on Edge 0  Control/stop worrying 0  Worry too much - different things 0  Trouble relaxing 0  Restless 0  Easily annoyed or irritable 0  Afraid - awful might happen 0  Total GAD 7 Score 0  Anxiety Difficulty Not difficult at all      Social History   Tobacco Use  Smoking Status Former   Pack years: 0.00   Types: Cigarettes   Quit date: 10/27/1984   Years since quitting: 36.5  Smokeless Tobacco Never  Tobacco Comments   quit 1985   BP Readings from Last 3 Encounters:  11/26/20 122/76  09/01/20 112/74  07/07/20 126/76   Pulse Readings from Last 3 Encounters:  11/26/20 68  09/01/20 83  07/07/20 76   Wt Readings from Last 3 Encounters:  11/26/20 128 lb (58.1 kg)  09/01/20 126 lb (57.2 kg)  07/07/20 127 lb 6 oz (57.8 kg)   BMI Readings from Last 3 Encounters:  11/26/20 25.00 kg/m  09/01/20 24.61 kg/m  07/07/20 24.88 kg/m    Assessment/Interventions: Review of patient past medical history, allergies, medications, health status, including review of consultants reports, laboratory and other test data, was performed as part of comprehensive evaluation and provision of chronic care management  services.   SDOH:  (Social Determinants of Health) assessments and interventions performed: Yes SDOH Interventions    Flowsheet Row Most Recent Value  SDOH Interventions   Financial Strain Interventions Other (Comment)  [trying for [employer] grant]      SDOH Screenings   Alcohol Screen: Not on file  Depression (BFX8-3): Not on file  Financial Resource Strain: Low Risk    Difficulty of Paying Living Expenses: Not very hard  Food Insecurity: Not on file  Housing: Not  on file  Physical Activity: Not on file  Social Connections: Not on file  Stress: Not on file  Tobacco Use: Medium Risk   Smoking Tobacco Use: Former   Smokeless Tobacco Use: Never  Transportation Needs: Not on file    [place]  Allergies  Allergen Reactions   Livalo [Pitavastatin] Other (See Comments)    Muscle aches    Medications Reviewed Today     Reviewed by Charlton Haws, [facility] (Pharmacist) on 04/30/21 at 1211  Med List Status: <None>   Medication Order Taking? Sig Documenting Provider Last Dose Status Informant  ALPRAZolam (XANAX) 0.25 MG tablet [id number] Yes TAKE ONE TABLET BY MOUTH THREE TIMES DAILY AS NEEDED FOR SLEEP OR ANXIETY Janith Lima, MD Taking Active   aspirin 81 MG tablet [id number] Yes Take 81 mg by mouth daily.  [provider] Taking Active Self  Biotin 5000 MCG CAPS [id number] Yes Take by mouth. [provider] Taking Active   carvedilol (COREG) 3.125 MG tablet [id number] Yes TAKE 1 TABLET BY MOUTH TWICE DAILY WITH A MEAL  Patient taking differently: Take 3.125 mg by mouth at bedtime.   Janith Lima, MD Taking Active   Cholecalciferol (VITAMIN D3) 5000 UNITS TABS [id number] Yes Take 1 tablet by mouth daily. [provider] Taking Active Self           Med Note Belva Agee   LKG Apr 19, 2015  6:57 AM) .  Coenzyme Q10 (COQ10) 100 MG CAPS [id number] Yes Take by mouth. [provider] Taking Active   Cyanocobalamin (VITAMIN B-12  CR) 1500 MCG TBCR [id number] Yes Take 1 tablet by mouth 3 (three) times a week. [provider] Taking Active Self           Med Note Belva Agee   QQV Apr 19, 2015  6:57 AM) .  LIVALO 2 MG TABS [id number] Yes Take 1 tablet by mouth once daily Janith Lima, MD Taking Active   Omega-3 Fatty Acids (FISH OIL) 1000 MG CAPS [id number] Yes Take 2 capsules by mouth daily. [provider] Taking Active Self           Med Note Belva Agee   CZY Apr 19, 2015  6:58 AM) .  pantoprazole (PROTONIX) 40 MG tablet [id number] Yes Take 1 tablet (40 mg total) by mouth daily as needed (acid reflux). Janith Lima, MD Taking Active   thyroid (NP THYROID) 30 MG tablet [id number] Yes Take 1 tablet (30 mg total) by mouth daily before breakfast. Janith Lima, MD Taking Active   zinc gluconate 50 MG tablet [id number] Yes Take 50 mg by mouth daily. 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   Hypertension 07/09/2019   Menopausal hot flushes 09/14/2018   Tachycardia 07/29/2015   Migraine equivalent syndrome 07/23/2015   Routine general medical examination at a health care facility 09/30/2014   Senile osteopenia 09/15/2012   Hyperlipidemia with target LDL less than 130 08/03/2011   Panic anxiety syndrome 04/15/2011   Hypothyroidism 04/30/2009   RAYNAUD'S DISEASE 04/30/2009   ESOPHAGEAL STRICTURE 04/30/2009   GERD 04/30/2009   Irritable  bowel syndrome 04/30/2009    Immunization History  Administered Date(s) Administered   Hep A / Hep B 09/09/2016   Hepatitis A 03/14/2017   Hepatitis B 10/06/2016, 03/14/2017   Influenza Split 09/15/2012   Influenza Whole  08/13/2010, 08/03/2011   Influenza, High Dose Seasonal PF 09/19/2013, 08/30/2014, 07/29/2015, 07/23/2017, 07/19/2019   Influenza-Unspecified 08/26/2016, 07/31/2018, 08/19/2020   Moderna SARS-COV2 Booster Vaccination 04/17/2021   Moderna Sars-Covid-2 Vaccination 12/04/2019, 01/04/2020, 09/25/2020   Pneumococcal Conjugate-13 09/30/2014   Pneumococcal Polysaccharide-23 08/13/2010, 09/01/2020   Td 04/30/2008, 09/28/2010   Tdap 08/30/2016   Zoster Recombinat (Shingrix) 11/09/2017, 02/20/2018   Zoster, Live 09/15/2012    Conditions to be addressed/monitored:  Hypertension, Hyperlipidemia, GERD, Hypothyroidism, Anxiety, and Osteopenia  Care Plan : [place]  Updates made by Charlton Haws, [place] since 04/30/2021 12:00 AM     Problem: Hypertension, Hyperlipidemia, GERD, Hypothyroidism, Anxiety, and Osteopenia   Priority: High     Long-Range Goal: Disease management   Start Date: 04/30/2021  Expected End Date: 04/30/2022  This Visit's Progress: On track  Priority: High  Note:   Current Barriers:  Unable to independently monitor therapeutic efficacy  Pharmacist Clinical Goal(s):  Patient will achieve adherence to monitoring guidelines and medication adherence to achieve therapeutic efficacy through collaboration with PharmD and provider.   Interventions: 1:1 collaboration with Janith Lima, MD regarding development and update of comprehensive plan of care as evidenced by provider attestation and co-signature Inter-disciplinary care team collaboration (see longitudinal plan of care) Comprehensive medication review performed; medication list updated in electronic medical record  Hypertension (BP goal <130/80) -Controlled - BP at home is at goal; she reports she has discussed taking carvedilol once daily with PCP; denies issues currently -Current treatment: Carvedilol 3.125 mg BID - takes once daily at bedtime -Current home readings: 123/78 -Current dietary habits:  limits carbs, sweets, fats, salt -Current exercise habits: 1 hour on treadmill daily, weights/sit ups few days a week -Denies hypotensive/hypertensive symptoms -Educated on BP goals and benefits of medications for prevention of heart attack, stroke and kidney damage; Symptoms of hypotension and importance of maintaining adequate hydration; -Counseled to monitor BP at home weekly, document, and provide log at future appointments -Recommended to continue current medication  Hyperlipidemia: (LDL goal < 100) -Controlled - LDL is at goal; pt reports compliance with Livalo; she had taken time off for joint pain but has restarted -Current treatment: Livalo 2 mg daily Omega-3 Fish oil 1000 mg - 2 cap daily Aspirin 81 mg daily -Educated on Cholesterol goals;  Benefits of statin for ASCVD risk reduction; -Recommended to continue current medication  Anxiety (Goal: manage symptoms) -Controlled - pt uses alprazolam very infrequently - last filled 06/2019 -Current treatment: Alprazolam 0.25 mg TID prn (no recent fills) -PHQ9: 0 (12/2019) -GAD7: 0 (02/2017) -Connected with PCP for mental health support -Educated on optimal PRN use of alprazolam -Recommended to continue current medication  Osteopenia (Goal prevent fractures) -Controlled - pt is overdue for DEXA -Last DEXA Scan: 01/2013   T-Score femoral neck: -1.9  T-Score lumbar spine: -1.7  10-year probability of major osteoporotic fracture: n/a  10-year probability of hip fracture: n/a -Patient is not a candidate for pharmacologic treatment -Current treatment  Vitamin D 5000 IU daily -Recommend [phone] units of vitamin D daily. Recommend 1200 mg of calcium daily from dietary and supplemental sources. -Recommended to continue current medication  Hypothyroidism (Goal: maintain TSH in goal range) -Not ideally controlled - most recent TSH was slightly above goal; pt  endorses trouble losing weight despite diet/exercise; she does take medication  on empty stomach separated from other meds -Current treatment  NP Thyroid 30 mg daily -Counseled on TSH and inverse relationship to T4 -Pt pays $45/90 day supply and is willing to potentially switch to levothyroxine - pt plans to discuss with PCP at 6 mo f/u -Current NP thyroid dose is equivalent to ~50 mcg of levothyroxine -Recommended to continue current medication; consider switching NP thyroid to levothyroxine at PCP f/u  GERD (Goal: manage symptoms) -Controlled - pt is taking OTC omeprazole currently and would prefer Rx PPI; she does not have a preference between omeprazole and pantoprazole -Current treatment  Pantoprazole 40 mg daily PRN -Refilled pantoprazole to [facility] -Vaccine gaps: Covid booster dose #4 (due 01/23/21) -Pt reports she had covid booster 04/17/21 at [facility] -Current therapy:  Vitamin B12 1500 mg Vitamin A 10,000 mg TIW Biotin 5000 mcg Magnesium 1500 mg Zinc 30 mg -Patient is satisfied with current therapy and denies issues -Discussed Vitamin A is fat-soluble and it is possible to get too much; Vitamin A deficiencies are rare and benefits of supplementation are unclear; advised patient to stop taking  Patient Goals/Self-Care Activities Patient will:  - take medications as prescribed focus on medication adherence by routine check blood pressure weekly, document, and provide at future appointments collaborate with provider on medication access solutions target a minimum of 150 minutes of moderate intensity exercise weekly -Apply for [place] (hypercholesterolemia) for Livalo copay assistance       Medication Assistance:  -Attempted to apply for [employer] for [place] - website was not working. Pt will try at home.  Compliance/Adherence/Medication fill history: Care Gaps: Covid booster dose #4 (due 01/23/21)  Star-Rating Drugs: Pitavastatin  - LF 02/02/21 x 90 ds  Patient's preferred pharmacy is:  [facility]  [address] [place] [address] [place] [address] Phone: [phone] Fax: [phone]  [place], [place] [address] [place] [address] Phone: [phone] Fax: [phone]  Uses pill box? No - prefers bottles Pt endorses 100% compliance  We discussed: Current pharmacy is preferred with insurance plan and patient is satisfied with pharmacy services Patient decided to: Continue current medication management strategy  Care Plan and Follow Up Patient Decision:  Patient agrees to Care Plan and Follow-up.  Plan: Telephone follow up appointment with care management team member scheduled for:  1 year  Charlene Brooke, PharmD, [place], CPP Clinical Pharmacist [facility] [phone]

## 2021-05-13 ENCOUNTER — Other Ambulatory Visit: Payer: Self-pay | Admitting: Internal Medicine

## 2021-05-13 DIAGNOSIS — E039 Hypothyroidism, unspecified: Secondary | ICD-10-CM

## 2021-05-26 ENCOUNTER — Telehealth: Payer: Self-pay | Admitting: Internal Medicine

## 2021-05-26 NOTE — Telephone Encounter (Signed)
LVM for pt to rtn my call to schedule AWV with [facility]. Please schedule this appt if pt calls the office.  °

## 2021-06-16 ENCOUNTER — Other Ambulatory Visit: Payer: Self-pay

## 2021-06-16 ENCOUNTER — Ambulatory Visit (INDEPENDENT_AMBULATORY_CARE_PROVIDER_SITE_OTHER): Payer: Medicare Other | Admitting: Internal Medicine

## 2021-06-16 ENCOUNTER — Encounter: Payer: Self-pay | Admitting: Internal Medicine

## 2021-06-16 VITALS — BP 138/74 | HR 61 | Temp 98.2°F | Resp 16 | Ht 60.0 in | Wt 127.0 lb

## 2021-06-16 DIAGNOSIS — E039 Hypothyroidism, unspecified: Secondary | ICD-10-CM | POA: Diagnosis not present

## 2021-06-16 DIAGNOSIS — I10 Essential (primary) hypertension: Secondary | ICD-10-CM

## 2021-06-16 DIAGNOSIS — F410 Panic disorder [episodic paroxysmal anxiety] without agoraphobia: Secondary | ICD-10-CM | POA: Diagnosis not present

## 2021-06-16 DIAGNOSIS — E785 Hyperlipidemia, unspecified: Secondary | ICD-10-CM

## 2021-06-16 LAB — BASIC METABOLIC PANEL
BUN: 15 mg/dL (ref 6–23)
CO2: 26 mEq/L (ref 19–32)
Calcium: 9.7 mg/dL (ref 8.4–10.5)
Chloride: 101 mEq/L (ref 96–112)
Creatinine, Ser: 0.75 mg/dL (ref 0.40–1.20)
GFR: 75.71 mL/min (ref >60.00)
Glucose, Bld: 87 mg/dL (ref 70–99)
Potassium: 4.4 mEq/L (ref 3.5–5.1)
Sodium: 138 mEq/L (ref 135–145)

## 2021-06-16 LAB — HEPATIC FUNCTION PANEL
ALT: 20 U/L (ref 0–35)
AST: 27 U/L (ref 0–37)
Albumin: 4.4 g/dL (ref 3.5–5.2)
Alkaline Phosphatase: 61 U/L (ref 39–117)
Bilirubin, Direct: 0.1 mg/dL (ref 0.0–0.3)
Total Bilirubin: 0.6 mg/dL (ref 0.2–1.2)
Total Protein: 7.3 g/dL (ref 6.0–8.3)

## 2021-06-16 LAB — LIPID PANEL
Cholesterol: 163 mg/dL (ref 0–200)
HDL: 71.5 mg/dL (ref >39.00)
LDL Cholesterol: 80 mg/dL (ref 0–99)
NonHDL: 91.12
Total CHOL/HDL Ratio: 2
Triglycerides: 56 mg/dL (ref 0.0–149.0)
VLDL: 11.2 mg/dL (ref 0.0–40.0)

## 2021-06-16 LAB — TSH: TSH: 5.05 u[IU]/mL (ref 0.35–5.50)

## 2021-06-16 MED ORDER — ALPRAZOLAM 0.25 MG PO TABS
ORAL_TABLET | ORAL | 1 refills | Status: DC
Start: 2021-06-16 — End: 2023-02-23

## 2021-06-16 NOTE — Progress Notes (Signed)
Subjective:  Patient ID: [name], female    DOB: 1941/01/21  Age: 80 y.o. MRN: [id number]  CC: Hypothyroidism, Hyperlipidemia, and Hypertension  This visit occurred during the SARS-CoV-2 public health emergency.  Safety protocols were in place, including screening questions prior to the visit, additional usage of staff PPE, and extensive cleaning of exam room while observing appropriate contact time as indicated for disinfecting solutions.    HPI [name] presents for f/up-   She continues to complain of anxiety and a panicky sensation.  She requests a refill of Xanax.  Outpatient Medications Prior to Visit  Medication Sig Dispense Refill   Biotin 5000 MCG CAPS Take by mouth.     carvedilol (COREG) 3.125 MG tablet TAKE 1 TABLET BY MOUTH TWICE DAILY WITH A MEAL (Patient taking differently: Take 3.125 mg by mouth at bedtime.) 180 tablet 0   Cholecalciferol (VITAMIN D3) 5000 UNITS TABS Take 1 tablet by mouth daily.     Coenzyme Q10 (COQ10) 100 MG CAPS Take by mouth.     Cyanocobalamin (VITAMIN B-12 CR) 1500 MCG TBCR Take 1 tablet by mouth 3 (three) times a week.     LIVALO 2 MG TABS Take 1 tablet by mouth once daily 90 tablet 1   NP THYROID 30 MG tablet TAKE 1 TABLET BY MOUTH ONCE DAILY BEFORE BREAKFAST 90 tablet 0   Omega-3 Fatty Acids (FISH OIL) 1000 MG CAPS Take 2 capsules by mouth daily.     pantoprazole (PROTONIX) 40 MG tablet Take 1 tablet (40 mg total) by mouth daily as needed (acid reflux). 90 tablet 1   zinc gluconate 50 MG tablet Take 50 mg by mouth daily.     ALPRAZolam (XANAX) 0.25 MG tablet TAKE ONE TABLET BY MOUTH THREE TIMES DAILY AS NEEDED FOR SLEEP OR ANXIETY 180 tablet 1   aspirin 81 MG tablet Take 81 mg by mouth daily.      No facility-administered medications prior to visit.    ROS Review of Systems  Constitutional:  Negative for diaphoresis, fatigue and unexpected weight change.  HENT: Negative.    Eyes: Negative.   Respiratory:  Negative for  cough, chest tightness, shortness of breath and wheezing.   Gastrointestinal:  Negative for abdominal pain, constipation and nausea.  Endocrine: Negative for cold intolerance and heat intolerance.  Genitourinary: Negative.  Negative for difficulty urinating.  Musculoskeletal: Negative.  Negative for arthralgias.  Skin: Negative.   Allergic/Immunologic: Negative.   Neurological: Negative.  Negative for dizziness.  Hematological:  Negative for adenopathy. Does not bruise/bleed easily.  Psychiatric/Behavioral:  Negative for agitation, dysphoric mood and sleep disturbance. The patient is nervous/anxious.    Objective:  BP 138/74 (BP Location: Left Arm, Patient Position: Sitting, Cuff Size: Normal)   Pulse 61   Temp 98.2 F (36.8 C) (Oral)   Resp 16   Ht 5' (1.524 m)   Wt 127 lb (57.6 kg)   SpO2 98%   BMI 24.80 kg/m   BP Readings from Last 3 Encounters:  06/16/21 138/74  11/26/20 122/76  09/01/20 112/74    Wt Readings from Last 3 Encounters:  06/16/21 127 lb (57.6 kg)  11/26/20 128 lb (58.1 kg)  09/01/20 126 lb (57.2 kg)    Physical Exam HENT:     Mouth/Throat:     Mouth: Mucous membranes are moist.  Eyes:     Conjunctiva/sclera: Conjunctivae normal.  Cardiovascular:     Rate and Rhythm: Normal rate and regular rhythm.  Heart sounds: No murmur heard. Pulmonary:     Effort: Pulmonary effort is normal.     Breath sounds: No stridor. No wheezing, rhonchi or rales.  Abdominal:     General: Abdomen is flat.     Palpations: There is no mass.     Tenderness: There is no abdominal tenderness. There is no guarding.  Musculoskeletal:        General: Normal range of motion.     Cervical back: Neck supple.     Right lower leg: No edema.     Left lower leg: No edema.  Lymphadenopathy:     Cervical: No cervical adenopathy.  Skin:    General: Skin is warm and dry.  Neurological:     General: No focal deficit present.     Mental Status: She is alert.  Psychiatric:         Mood and Affect: Mood normal.        Behavior: Behavior normal.    Lab Results  Component Value Date   WBC 4.6 05/20/2020   HGB 13.1 05/20/2020   HCT 37.8 05/20/2020   PLT 244.0 05/20/2020   GLUCOSE 87 06/16/2021   CHOL 163 06/16/2021   TRIG 56.0 06/16/2021   HDL 71.50 06/16/2021   LDLDIRECT 130.0 08/03/2011   LDLCALC 80 06/16/2021   ALT 20 06/16/2021   AST 27 06/16/2021   NA 138 06/16/2021   K 4.4 06/16/2021   CL 101 06/16/2021   CREATININE 0.75 06/16/2021   BUN 15 06/16/2021   CO2 26 06/16/2021   TSH 5.05 06/16/2021   HGBA1C 5.5 06/06/2019    DG Chest 2 View  Result Date: 08/20/2019 CLINICAL DATA:  Thoracic spine pain radiating into the chest for 3 weeks. EXAM: CHEST - 2 VIEW COMPARISON:  PA and lateral chest 04/05/2014. FINDINGS: Lungs are clear. Heart size is normal. No pneumothorax or pleural fluid. No acute or focal bony abnormality. Multilevel anterior endplate spurring appears unchanged. IMPRESSION: No acute disease. 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 Electronically Signed   By: Inge Rise M.D.   On: 08/20/2019 14:07   DG Thoracic Spine W/Swimmers  Result Date: 08/20/2019 CLINICAL DATA:  Chest pain, no known injury, initial encounter EXAM: THORACIC SPINE - 3 VIEWS COMPARISON:  04/15/2011 FINDINGS: Vertebral body height is well maintained. Multilevel osteophytic changes are seen. No acute compression deformity is noted. No paraspinal mass is seen. IMPRESSION: Degenerative change without acute abnormality. Electronically Signed   By: Inez Catalina M.D.   On: 08/20/2019 14:15    Assessment & Plan:   [name] was seen today for hypothyroidism, hyperlipidemia and hypertension.  Diagnoses and all orders for this visit:  Primary hypertension- Her BP is well controlled. -     Basic metabolic panel; Future -     Hepatic function panel; Future -     Hepatic function panel -     Basic metabolic panel  Panic anxiety syndrome -     ALPRAZolam (XANAX)  0.25 MG tablet; TAKE ONE TABLET BY MOUTH THREE TIMES DAILY AS NEEDED FOR SLEEP OR ANXIETY  Acquired hypothyroidism - Her TSH is in the normal range. Will cont the current T3 dose. -     TSH; Future -     Hepatic function panel; Future -     Hepatic function panel -     TSH  Hyperlipidemia with target LDL less than 130- LDL goal achieved. Doing well on the statin  -  Lipid panel; Future -     Lipid panel  I have discontinued Izora Gala P. Machia's aspirin. I am also having her maintain her Vitamin B-12 CR, Fish Oil, Vitamin D3, Livalo, carvedilol, Biotin, CoQ10, zinc gluconate, pantoprazole, NP Thyroid, and ALPRAZolam.  Meds ordered this encounter  Medications   ALPRAZolam (XANAX) 0.25 MG tablet    Sig: TAKE ONE TABLET BY MOUTH THREE TIMES DAILY AS NEEDED FOR SLEEP OR ANXIETY    Dispense:  180 tablet    Refill:  1     Follow-up: Return in about 6 months (around 12/17/2021).  Scarlette Calico, MD

## 2021-06-16 NOTE — Patient Instructions (Signed)

## 2021-06-24 DIAGNOSIS — H0102A Squamous blepharitis right eye, upper and lower eyelids: Secondary | ICD-10-CM | POA: Diagnosis not present

## 2021-06-24 DIAGNOSIS — H40013 Open angle with borderline findings, low risk, bilateral: Secondary | ICD-10-CM | POA: Diagnosis not present

## 2021-06-24 DIAGNOSIS — H0102B Squamous blepharitis left eye, upper and lower eyelids: Secondary | ICD-10-CM | POA: Diagnosis not present

## 2021-06-24 DIAGNOSIS — H43813 Vitreous degeneration, bilateral: Secondary | ICD-10-CM | POA: Diagnosis not present

## 2021-06-24 DIAGNOSIS — H2513 Age-related nuclear cataract, bilateral: Secondary | ICD-10-CM | POA: Diagnosis not present

## 2021-06-24 DIAGNOSIS — H16142 Punctate keratitis, left eye: Secondary | ICD-10-CM | POA: Diagnosis not present

## 2021-06-24 DIAGNOSIS — H16223 Keratoconjunctivitis sicca, not specified as Sjogren's, bilateral: Secondary | ICD-10-CM | POA: Diagnosis not present

## 2021-07-29 ENCOUNTER — Other Ambulatory Visit: Payer: Self-pay | Admitting: Internal Medicine

## 2021-07-29 DIAGNOSIS — E039 Hypothyroidism, unspecified: Secondary | ICD-10-CM

## 2021-07-30 ENCOUNTER — Other Ambulatory Visit: Payer: Self-pay | Admitting: Internal Medicine

## 2021-07-30 DIAGNOSIS — E785 Hyperlipidemia, unspecified: Secondary | ICD-10-CM

## 2021-09-09 ENCOUNTER — Ambulatory Visit (INDEPENDENT_AMBULATORY_CARE_PROVIDER_SITE_OTHER): Payer: Medicare Other

## 2021-09-09 ENCOUNTER — Other Ambulatory Visit: Payer: Self-pay

## 2021-09-09 VITALS — BP 120/70 | HR 71 | Temp 98.1°F | Resp 16 | Ht 60.0 in | Wt 128.2 lb

## 2021-09-09 DIAGNOSIS — Z0000 Encounter for general adult medical examination without abnormal findings: Secondary | ICD-10-CM | POA: Diagnosis not present

## 2021-09-09 NOTE — Patient Instructions (Signed)
[name] , Thank you for taking time to come for your Medicare Wellness Visit. I appreciate your ongoing commitment to your health goals. Please review the following plan we discussed and let me know if I can assist you in the future.   Screening recommendations/referrals: Colonoscopy: last done 11/08/2011; no repeat due to age Mammogram: 12/05/2020; rechecked 02/04/2021 with 3D ultrasound of right breast (patient provided information) Bone Density: last done 02/09/2013 Recommended yearly ophthalmology/optometry visit for glaucoma screening and checkup Recommended yearly dental visit for hygiene and checkup  Vaccinations: Influenza vaccine: [facility]/19/2021 at [facility] Pneumococcal vaccine: 09/30/2014, 09/01/2020 Tdap vaccine: 08/30/2016; due every 10 years Shingles vaccine: 11/09/2017, 02/20/2018   Covid-19:12/04/2019, 01/04/2020, 09/25/2020, 04/17/2021, 08/19/2021  Advanced directives: Please bring a copy of your health care power of attorney and living will to the office at your convenience.  Conditions/risks identified: Yes; Client understands the importance of follow-up with providers by attending scheduled visits and discussed goals to eat healthier, increase physical activity, exercise the brain, socialize more, get enough sleep and make time for laughter.  Next appointment: Please schedule your next Medicare Wellness Visit with your Nurse Health Advisor in 1 year by calling [phone].   Preventive Care 79 Years and Older, Female Preventive care refers to lifestyle choices and visits with your health care provider that can promote health and wellness. What does preventive care include? A yearly physical exam. This is also called an annual well check. Dental exams once or twice a year. Routine eye exams. Ask your health care provider how often you should have your eyes checked. Personal lifestyle choices, including: Daily care of your teeth and gums. Regular physical activity. Eating a healthy  diet. Avoiding tobacco and drug use. Limiting alcohol use. Practicing safe sex. Taking low-dose aspirin every day. Taking vitamin and mineral supplements as recommended by your health care provider. What happens during an annual well check? The services and screenings done by your health care provider during your annual well check will depend on your age, overall health, lifestyle risk factors, and family history of disease. Counseling  Your health care provider may ask you questions about your: Alcohol use. Tobacco use. Drug use. Emotional well-being. Home and relationship well-being. Sexual activity. Eating habits. History of falls. Memory and ability to understand (cognition). Work and work Statistician. Reproductive health. Screening  You may have the following tests or measurements: Height, weight, and BMI. Blood pressure. Lipid and cholesterol levels. These may be checked every 5 years, or more frequently if you are over 79 years old. Skin check. Lung cancer screening. You may have this screening every year starting at age 79 if you have a 30-pack-year history of smoking and currently smoke or have quit within the past 15 years. Fecal occult blood test (FOBT) of the stool. You may have this test every year starting at age 79. Flexible sigmoidoscopy or colonoscopy. You may have a sigmoidoscopy every 5 years or a colonoscopy every 10 years starting at age 79. Hepatitis C blood test. Hepatitis B blood test. Sexually transmitted disease (STD) testing. Diabetes screening. This is done by checking your blood sugar (glucose) after you have not eaten for a while (fasting). You may have this done every 1-3 years. Bone density scan. This is done to screen for osteoporosis. You may have this done starting at age 79. Mammogram. This may be done every 1-2 years. Talk to your health care provider about how often you should have regular mammograms. Talk with your health care provider about  your test results, treatment options, and if necessary, the need for more tests. Vaccines  Your health care provider may recommend certain vaccines, such as: Influenza vaccine. This is recommended every year. Tetanus, diphtheria, and acellular pertussis (Tdap, Td) vaccine. You may need a Td booster every 10 years. Zoster vaccine. You may need this after age 79. Pneumococcal 13-valent conjugate (PCV13) vaccine. One dose is recommended after age 79. Pneumococcal polysaccharide (PPSV23) vaccine. One dose is recommended after age 79. Talk to your health care provider about which screenings and vaccines you need and how often you need them. This information is not intended to replace advice given to you by your health care provider. Make sure you discuss any questions you have with your health care provider. Document Released: 12/05/2015 Document Revised: 07/28/2016 Document Reviewed: 09/09/2015 Elsevier Interactive Patient Education  2017 [employer] Prevention in the Home Falls can cause injuries. They can happen to people of all ages. There are many things you can do to make your home safe and to help prevent falls. What can I do on the outside of my home? Regularly fix the edges of walkways and driveways and fix any cracks. Remove anything that might make you trip as you walk through a door, such as a raised step or threshold. Trim any bushes or trees on the path to your home. Use bright outdoor lighting. Clear any walking paths of anything that might make someone trip, such as rocks or tools. Regularly check to see if handrails are loose or broken. Make sure that both sides of any steps have handrails. Any raised decks and porches should have guardrails on the edges. Have any leaves, snow, or ice cleared regularly. Use sand or salt on walking paths during winter. Clean up any spills in your garage right away. This includes oil or grease spills. What can I do in the bathroom? Use  night lights. Install grab bars by the toilet and in the tub and shower. Do not use towel bars as grab bars. Use non-skid mats or decals in the tub or shower. If you need to sit down in the shower, use a plastic, non-slip stool. Keep the floor dry. Clean up any water that spills on the floor as soon as it happens. Remove soap buildup in the tub or shower regularly. Attach bath mats securely with double-sided non-slip rug tape. Do not have throw rugs and other things on the floor that can make you trip. What can I do in the bedroom? Use night lights. Make sure that you have a light by your bed that is easy to reach. Do not use any sheets or blankets that are too big for your bed. They should not hang down onto the floor. Have a firm chair that has side arms. You can use this for support while you get dressed. Do not have throw rugs and other things on the floor that can make you trip. What can I do in the kitchen? Clean up any spills right away. Avoid walking on wet floors. Keep items that you use a lot in easy-to-reach places. If you need to reach something above you, use a strong step stool that has a grab bar. Keep electrical cords out of the way. Do not use floor polish or wax that makes floors slippery. If you must use wax, use non-skid floor wax. Do not have throw rugs and other things on the floor that can make you trip. What can I do with my  stairs? Do not leave any items on the stairs. Make sure that there are handrails on both sides of the stairs and use them. Fix handrails that are broken or loose. Make sure that handrails are as long as the stairways. Check any carpeting to make sure that it is firmly attached to the stairs. Fix any carpet that is loose or worn. Avoid having throw rugs at the top or bottom of the stairs. If you do have throw rugs, attach them to the floor with carpet tape. Make sure that you have a light switch at the top of the stairs and the bottom of the  stairs. If you do not have them, ask someone to add them for you. What else can I do to help prevent falls? Wear shoes that: Do not have high heels. Have rubber bottoms. Are comfortable and fit you well. Are closed at the toe. Do not wear sandals. If you use a stepladder: Make sure that it is fully opened. Do not climb a closed stepladder. Make sure that both sides of the stepladder are locked into place. Ask someone to hold it for you, if possible. Clearly mark and make sure that you can see: Any grab bars or handrails. First and last steps. Where the edge of each step is. Use tools that help you move around (mobility aids) if they are needed. These include: Canes. Walkers. Scooters. Crutches. Turn on the lights when you go into a dark area. Replace any light bulbs as soon as they burn out. Set up your furniture so you have a clear path. Avoid moving your furniture around. If any of your floors are uneven, fix them. If there are any pets around you, be aware of where they are. Review your medicines with your doctor. Some medicines can make you feel dizzy. This can increase your chance of falling. Ask your doctor what other things that you can do to help prevent falls. This information is not intended to replace advice given to you by your health care provider. Make sure you discuss any questions you have with your health care provider. Document Released: 09/04/2009 Document Revised: 04/15/2016 Document Reviewed: 12/13/2014 Elsevier Interactive Patient Education  2017 [employer].

## 2021-09-09 NOTE — Progress Notes (Addendum)
Subjective:   [name] is a 80 y.o. female who presents for Medicare Annual (Subsequent) preventive examination.  Review of Systems     Cardiac Risk Factors include: advanced age (>35mn, >>67women);dyslipidemia;family history of premature cardiovascular disease;hypertension     Objective:    Today's Vitals   09/09/21 1141  BP: 120/70  Pulse: 71  Resp: 16  Temp: 98.1 F (36.7 C)  SpO2: 95%  Weight: 128 lb 3.2 oz (58.2 kg)  Height: 5' (1.524 m)  PainSc: 0-No pain   Body mass index is 25.04 kg/m.  Advanced Directives 09/09/2021 06/30/2017 12/01/2015 04/19/2015 10/13/2014  Does Patient Have a Medical Advance Directive? Yes Yes Yes No Yes  Type of Advance Directive Living will;Healthcare [place]  Does patient want to make changes to medical advance directive? No - Patient declined - No - Patient declined - No - Patient declined  Copy of [place] Chart? No - copy requested No - copy requested Yes - No - copy requested  Would patient like information on creating a medical advance directive? - - - No - patient declined information -    Current Medications (verified) Outpatient Encounter Medications as of 09/09/2021  Medication Sig   carvedilol (COREG) 3.125 MG tablet TAKE 1 TABLET BY MOUTH TWICE DAILY WITH A MEAL (Patient taking differently: Take 3.125 mg by mouth at bedtime.)   LIVALO 2 MG TABS Take 1 tablet by mouth once daily   NP THYROID 30 MG tablet TAKE 1 TABLET BY MOUTH ONCE DAILY BEFORE BREAKFAST   ALPRAZolam (XANAX) 0.25 MG tablet TAKE ONE TABLET BY MOUTH THREE TIMES DAILY AS NEEDED FOR SLEEP OR ANXIETY   Biotin 5000 MCG CAPS Take by mouth.   Cholecalciferol (VITAMIN D3) 5000 UNITS TABS Take 1 tablet by mouth daily.   Coenzyme Q10 (COQ10) 100 MG CAPS Take by mouth.   Cyanocobalamin (VITAMIN B-12 CR) 1500 MCG  TBCR Take 1 tablet by mouth 3 (three) times a week.   Omega-3 Fatty Acids (FISH OIL) 1000 MG CAPS Take 2 capsules by mouth daily.   pantoprazole (PROTONIX) 40 MG tablet Take 1 tablet (40 mg total) by mouth daily as needed (acid reflux).   zinc gluconate 50 MG tablet Take 50 mg by mouth daily.   No facility-administered encounter medications on file as of 09/09/2021.    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   Hx of cardiovascular stress test    ETT-Myoview (04/2014):  No ischemia, EF 85%; Normal // Myoview 11/17: EF 92, no scar or ischemia, Low Risk // Nuclear stress test 9/19: EF 84, no ischemia; Low Risk    Hx of echocardiogram    Echo (12/15):  EF 55-60%, no RWMA, normal diast function, MAC, LA 33 mm   Hyperlipidemia    HYPOTHYROIDISM 04/30/2009   Irritable bowel syndrome 04/30/2009   RAYNAUD'S DISEASE 04/30/2009   Past Surgical History:  Procedure Laterality Date   ABDOMINAL HYSTERECTOMY  1976   COLONOSCOPY     ECTOPIC [place]REGNANCY SURGERY  [place]  UPPER GASTROINTESTINAL ENDOSCOPY     Family History  Problem Relation Age of Onset   Heart disease Father    Heart attack Father    Cancer Sister        breast cancer   Diabetes Maternal Aunt    Diabetes Maternal Uncle    Cancer Paternal Aunt        lung cancer   Diabetes Maternal Grandmother    Heart disease Paternal Grandmother    Heart disease Paternal Grandfather    Stroke Cousin    Migraines Daughter    Colon cancer Neg Hx    Social History   Socioeconomic History   Marital status: Widowed    Spouse name: Not on file   Number of children: 2   Years of education: Not on file   Highest education level: Not on file  Occupational History   Occupation:  Sales promotion account executive    Employer: RETIRED  Tobacco Use   Smoking status: Former    Types: Cigarettes    Quit date: 10/27/1984    Years since quitting: 36.8   Smokeless tobacco: Never   Tobacco comments:    quit 1985  Vaping Use   Vaping Use: Never used  Substance and Sexual Activity   Alcohol use: Yes    Alcohol/week: 0.0 standard drinks    Comment: 1-2 per week   Drug use: No   Sexual activity: Not Currently    Birth control/protection: Post-menopausal  Other Topics Concern   Not on file  Social History Narrative   Patients gets regular exercise.   Patient lives alone in a one story home with a basement.  She is a widow.  Has 2 children.  Retired from [employer].  Education: some college.   Social Determinants of Health   Financial Resource Strain: Low Risk    Difficulty of Paying Living Expenses: Not hard at all  Food Insecurity: No Food Insecurity   Worried About Charity fundraiser in the Last Year: Never true   [place] in the Last Year: Never true  Transportation Needs: No Transportation Needs   Lack of Transportation (Medical): No   Lack of Transportation (Non-Medical): No  Physical Activity: Sufficiently Active   Days of Exercise per Week: 7 days   Minutes of Exercise per Session: 60 min  Stress: No Stress Concern Present   Feeling of Stress : Not at all  Social Connections: Moderately Integrated   Frequency of Communication with Friends and Family: More than three times a week   Frequency of Social Gatherings with Friends and Family: More than three times a week   Attends Religious Services: More than 4 times per year   Active Member of Clubs or Organizations: Not on file   Attends Archivist Meetings: More than 4 times per year   Marital Status: Widowed    Tobacco Counseling Counseling given: Not Answered Tobacco comments: quit 1985   Clinical Intake:  Pre-visit preparation completed: Yes  Pain : No/denies pain Pain Score:  0-No pain     BMI - recorded: 25.04 Nutritional Status: BMI 25 -29 Overweight Nutritional Risks: None Diabetes: No  How often do you need to have someone help you when you read instructions, pamphlets, or other written materials from your doctor or pharmacy?: 1 - Never What is the last grade level you completed in school?: High School Graduate; some college courses  Diabetic? no  Interpreter Needed?: No  Information entered by :: Lisette Abu, LPN   Activities of Daily Living In your present  state of health, do you have any difficulty performing the following activities: 09/09/2021 11/26/2020  Hearing? N N  Vision? N N  Difficulty concentrating or making decisions? N N  Walking or climbing stairs? N N  Dressing or bathing? N N  Doing errands, shopping? N N  Preparing Food and eating ? N -  Using the Toilet? N -  In the past six months, have you accidently leaked urine? N -  Do you have problems with loss of bowel control? N -  Managing your Medications? N -  Managing your Finances? N -  Housekeeping or managing your Housekeeping? N -  Some recent data might be hidden    Patient Care Team: Janith Lima, MD as PCP - General Fay Records, MD as PCP - Cardiology (Cardiology) Charlton Haws, [facility] as Pharmacist (Pharmacist)  Indicate any recent Medical Services you may have received from other than [facility] providers in the past year (date may be approximate).     Assessment:   This is a routine wellness examination for [place].  Hearing/Vision screen Hearing Screening - Comments:: Patient denied any hearing difficulty.   No hearing aids.  Vision Screening - Comments:: Patient wears glasses for driving.  Eye exam done annually by: Dr. Warden Fillers.  Dietary issues and exercise activities discussed: Current Exercise Habits: Home exercise routine, Type of exercise: treadmill, Time (Minutes): 60, Frequency (Times/Week): 7, Weekly Exercise (Minutes/Week): 420,  Intensity: Moderate, Exercise limited by: None identified   Goals Addressed             This Visit's Progress    Maintain current health status   On track    Continue to travel, enjoy life, family, friends, church.      Depression Screen PHQ 2/9 Scores 09/09/2021 06/16/2021 01/14/2020 06/06/2019 09/14/2018 06/30/2017 03/16/2017  PHQ - 2 Score 0 0 0 0 0 0 0  PHQ- 9 Score - - - 0 0 0 1    Fall Risk Fall Risk  09/09/2021 07/07/2020 06/06/2019 09/14/2018 06/30/2017  Falls in the past year? 0 0 0 No No  Number falls in past yr: 0 0 0 - -  Injury with Fall? 0 0 0 - -  Risk for fall due to : No Fall Risks No Fall Risks - - -  Follow up Falls evaluation completed Falls evaluation completed Falls evaluation completed - -    FALL RISK PREVENTION PERTAINING TO THE HOME:  Any stairs in or around the home? Yes  If so, are there any without handrails? No  Home free of loose throw rugs in walkways, pet beds, electrical cords, etc? Yes  Adequate lighting in your home to reduce risk of falls? Yes   ASSISTIVE DEVICES UTILIZED TO PREVENT FALLS:  Life alert? No  Use of a cane, walker or w/c? No  Grab bars in the bathroom? No  Shower chair or bench in shower? Yes  Elevated toilet seat or a handicapped toilet? No   TIMED UP AND GO:  Was the test performed? Yes .  Length of time to ambulate 10 feet: 6 sec.   Gait steady and fast without use of assistive device  Cognitive Function: Normal cognitive status assessed by direct observation by this Nurse Health Advisor. No abnormalities found.          Immunizations Immunization History  Administered Date(s) Administered   Fluad Quad(high Dose 65+) 08/19/2021   Hep A / Hep B 09/09/2016   Hepatitis A 03/14/2017   Hepatitis  B 10/06/2016, 03/14/2017   Influenza Split 09/15/2012   Influenza Whole 08/13/2010, 08/03/2011   Influenza, High Dose Seasonal PF 09/19/2013, 08/30/2014, 07/29/2015, 07/23/2017, 07/19/2019   Influenza-Unspecified  08/26/2016, 07/31/2018, 08/19/2020   Moderna SARS-COV2 Booster Vaccination 04/17/2021   Moderna Sars-Covid-2 Vaccination 12/04/2019, 01/04/2020, 09/25/2020   Pneumococcal Conjugate-13 09/30/2014   Pneumococcal Polysaccharide-23 08/13/2010, 09/01/2020   Td 04/30/2008, 09/28/2010   Tdap 08/30/2016   Zoster Recombinat (Shingrix) 11/09/2017, 02/20/2018   Zoster, Live 09/15/2012    TDAP status: Up to date  Flu Vaccine status: Up to date  Pneumococcal vaccine status: Up to date  Covid-19 vaccine status: Completed vaccines  Qualifies for Shingles Vaccine? Yes   Zostavax completed Yes   Shingrix Completed?: Yes  Screening Tests Health Maintenance  Topic Date Due   TETANUS/TDAP  08/30/2026   Pneumonia Vaccine 60+ Years old  Completed   INFLUENZA VACCINE  Completed   DEXA SCAN  Completed   COVID-19 Vaccine  Completed   Hepatitis C Screening  Completed   Zoster Vaccines- Shingrix  Completed   HPV VACCINES  Aged Out    Health Maintenance  There are no preventive care reminders to display for this patient.  Colorectal cancer screening: No longer required.   Mammogram status: Completed 02/04/2021. Repeat every year  Bone Density status: Completed 02/09/2013. Results reflect: Bone density results: OSTEOPENIA. Repeat every 0 years. (No longer recommended)  Lung Cancer Screening: (Low Dose CT Chest recommended if Age 67-80 years, 30 pack-year currently smoking OR have quit w/in 15years.) does not qualify.   Lung Cancer Screening Referral: no  Additional Screening:  Hepatitis C Screening: does not qualify; Completed no  Vision Screening: Recommended annual ophthalmology exams for early detection of glaucoma and other disorders of the eye. Is the patient up to date with their annual eye exam?  Yes  Who is the provider or what is the name of the office in which the patient attends annual eye exams? Warden Fillers, MD. If pt is not established with a provider, would they like to  be referred to a provider to establish care? No .   Dental Screening: Recommended annual dental exams for proper oral hygiene  Community Resource Referral / Chronic Care Management: CRR required this visit?  No   CCM required this visit?  No      Plan:     I have personally reviewed and noted the following in the patient's chart:   Medical and social history Use of alcohol, tobacco or illicit drugs  Current medications and supplements including opioid prescriptions.  Functional ability and status Nutritional status Physical activity Advanced directives List of other physicians Hospitalizations, surgeries, and ER visits in previous 12 months Vitals Screenings to include cognitive, depression, and falls Referrals and appointments  In addition, I have reviewed and discussed with patient certain preventive protocols, quality metrics, and best practice recommendations. A written personalized care plan for preventive services as well as general preventive health recommendations were provided to patient.     Sheral Flow, LPN   [id number]   Nurse Notes:  Hearing Screening - Comments:: Patient denied any hearing difficulty.   No hearing aids.  Vision Screening - Comments:: Patient wears glasses for driving.  Eye exam done annually by: Dr. Warden Fillers.    Medical screening examination/treatment/procedure(s) were performed by non-physician practitioner and as supervising physician I was immediately available for consultation/collaboration.  I agree with above. Lew Dawes, MD

## 2021-09-17 DIAGNOSIS — H2511 Age-related nuclear cataract, right eye: Secondary | ICD-10-CM | POA: Diagnosis not present

## 2021-09-22 DIAGNOSIS — H9193 Unspecified hearing loss, bilateral: Secondary | ICD-10-CM | POA: Diagnosis not present

## 2021-09-25 DIAGNOSIS — H903 Sensorineural hearing loss, bilateral: Secondary | ICD-10-CM | POA: Diagnosis not present

## 2021-09-29 DIAGNOSIS — Z961 Presence of intraocular lens: Secondary | ICD-10-CM | POA: Diagnosis not present

## 2021-09-29 DIAGNOSIS — H2512 Age-related nuclear cataract, left eye: Secondary | ICD-10-CM | POA: Diagnosis not present

## 2021-10-01 DIAGNOSIS — H2512 Age-related nuclear cataract, left eye: Secondary | ICD-10-CM | POA: Diagnosis not present

## 2021-10-21 ENCOUNTER — Other Ambulatory Visit: Payer: Self-pay | Admitting: Internal Medicine

## 2021-10-21 DIAGNOSIS — R000 Tachycardia, unspecified: Secondary | ICD-10-CM

## 2021-10-21 DIAGNOSIS — K219 Gastro-esophageal reflux disease without esophagitis: Secondary | ICD-10-CM

## 2021-10-21 DIAGNOSIS — E785 Hyperlipidemia, unspecified: Secondary | ICD-10-CM

## 2021-10-21 DIAGNOSIS — E039 Hypothyroidism, unspecified: Secondary | ICD-10-CM

## 2021-10-27 DIAGNOSIS — H905 Unspecified sensorineural hearing loss: Secondary | ICD-10-CM | POA: Diagnosis not present

## 2021-11-20 ENCOUNTER — Encounter: Payer: Self-pay | Admitting: Internal Medicine

## 2021-11-20 NOTE — Progress Notes (Signed)
Subjective:    Patient ID: [name], female    DOB: 25-Aug-1941, 80 y.o.   MRN: [id number]  This visit occurred during the SARS-CoV-2 public health emergency.  Safety protocols were in place, including screening questions prior to the visit, additional usage of staff PPE, and extensive cleaning of exam room while observing appropriate contact time as indicated for disinfecting solutions.     HPI The patient is here for follow up of their chronic medical problems, including hypothyroid, htn, hld, gerd, anxiety  Had the  flu one month ago and has persistent throat irritation and has chronic nasal congestion.  She took sudafed for about one week.  She is wondering what else she can take for symptoms.  Medications and allergies reviewed with patient and updated if appropriate.  Patient Active Problem List   Diagnosis Date Noted   Spinal stenosis of cervical region 07/07/2020   DDD (degenerative disc disease), cervical 07/07/2020   Irritable bowel syndrome with constipation 05/28/2020   Trochanteric bursitis of left hip 04/17/2020   DDD (degenerative disc disease), thoracic 08/20/2019   Hypertension 07/09/2019   Tachycardia 07/29/2015   Migraine equivalent syndrome 07/23/2015   Routine general medical examination at a health care facility 09/30/2014   Senile osteopenia 09/15/2012   Hyperlipidemia with target LDL less than 130 08/03/2011   Panic anxiety syndrome 04/15/2011   Hypothyroidism 04/30/2009   RAYNAUD'S DISEASE 04/30/2009   ESOPHAGEAL STRICTURE 04/30/2009   GERD 04/30/2009    Current Outpatient Medications on File Prior to Visit  Medication Sig Dispense Refill   ALPRAZolam (XANAX) 0.25 MG tablet TAKE ONE TABLET BY MOUTH THREE TIMES DAILY AS NEEDED FOR SLEEP OR ANXIETY 180 tablet 1   Biotin 5000 MCG CAPS Take by mouth.     carvedilol (COREG) 3.125 MG tablet TAKE 1 TABLET BY MOUTH TWICE DAILY WITH A MEAL 180 tablet 0   Cholecalciferol (VITAMIN D3) 5000 UNITS  TABS Take 1 tablet by mouth daily.     Coenzyme Q10 (COQ10) 100 MG CAPS Take by mouth.     Cyanocobalamin (VITAMIN B-12 CR) 1500 MCG TBCR Take 1 tablet by mouth 3 (three) times a week.     LIVALO 2 MG TABS Take 1 tablet by mouth once daily 90 tablet 0   NP THYROID 30 MG tablet TAKE 1 TABLET BY MOUTH ONCE DAILY BEFORE BREAKFAST 90 tablet 0   Omega-3 Fatty Acids (FISH OIL) 1000 MG CAPS Take 2 capsules by mouth daily.     pantoprazole (PROTONIX) 40 MG tablet TAKE 1 TABLET BY MOUTH ONCE DAILY AS NEEDED (ACID  REFLUX) 90 tablet 0   zinc gluconate 50 MG tablet Take 50 mg by mouth daily.     Prednisol Ace-Moxiflox-Bromfen 1-0.5-0.075 % SUSP Place 1 drop into the left eye 4 (four) times daily.     No current facility-administered medications on file prior to visit.    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   Hx of cardiovascular stress test    ETT-Myoview (04/2014):  No ischemia, EF 85%; Normal // Myoview 11/17: EF 92, no scar or ischemia, Low Risk // Nuclear stress test 9/19: EF 84, no ischemia; Low Risk    Hx of echocardiogram    Echo (12/15):  EF 55-60%, no RWMA, normal diast function, MAC, LA 33 mm  Hyperlipidemia    HYPOTHYROIDISM 04/30/2009   Irritable bowel syndrome 04/30/2009   RAYNAUD'S DISEASE 04/30/2009    Past Surgical History:  Procedure Laterality Date   ABDOMINAL HYSTERECTOMY  1976   COLONOSCOPY     ECTOPIC PREGNANCY SURGERY  1970   foot [place]   UPPER GASTROINTESTINAL ENDOSCOPY      Social History   Socioeconomic History   Marital status: Widowed    Spouse name: Not on file   Number of children: 2   Years of education: Not on file   Highest education level: Not on file  Occupational History   Occupation: Sales promotion account executive    Employer: RETIRED  Tobacco Use   Smoking  status: Former    Types: Cigarettes    Quit date: 10/27/1984    Years since quitting: 37.1   Smokeless tobacco: Never   Tobacco comments:    quit 1985  Vaping Use   Vaping Use: Never used  Substance and Sexual Activity   Alcohol use: Yes    Alcohol/week: 0.0 standard drinks    Comment: 1-2 per week   Drug use: No   Sexual activity: Not Currently    Birth control/protection: Post-menopausal  Other Topics Concern   Not on file  Social History Narrative   Patients gets regular exercise.   Patient lives alone in a one story home with a basement.  She is a widow.  Has 2 children.  Retired from [employer].  Education: some college.   Social Determinants of Health   Financial Resource Strain: Low Risk    Difficulty of Paying Living Expenses: Not hard at all  Food Insecurity: No Food Insecurity   Worried About Charity fundraiser in the Last Year: Never true   [place] in the Last Year: Never true  Transportation Needs: No Transportation Needs   Lack of Transportation (Medical): No   Lack of Transportation (Non-Medical): No  Physical Activity: Sufficiently Active   Days of Exercise per Week: 7 days   Minutes of Exercise per Session: 60 min  Stress: No Stress Concern Present   Feeling of Stress : Not at all  Social Connections: Moderately Integrated   Frequency of Communication with Friends and Family: More than three times a week   Frequency of Social Gatherings with Friends and Family: More than three times a week   Attends Religious Services: More than 4 times per year   Active Member of Clubs or Organizations: Not on file   Attends Archivist Meetings: More than 4 times per year   Marital Status: Widowed    Family History  Problem Relation Age of Onset   Heart disease Father    Heart attack Father    Cancer Sister        breast cancer   Diabetes Maternal Aunt    Diabetes Maternal Uncle    Cancer Paternal Aunt        lung cancer   Diabetes  Maternal Grandmother    Heart disease Paternal Grandmother    Heart disease Paternal Grandfather    Stroke Cousin    Migraines Daughter    Colon cancer Neg Hx     Review of Systems  Constitutional:  Negative for chills and fever.  HENT:  Positive for congestion and sore throat. Negative for ear pain, sinus pressure and sinus pain.   Respiratory:  Negative for cough, shortness of breath and  wheezing.   Cardiovascular:  Negative for chest pain, palpitations and leg swelling.  Neurological:  Negative for dizziness, light-headedness and headaches.      Objective:   Vitals:   11/24/21 0849  BP: 118/68  Pulse: 68  Temp: 98 F (36.7 C)  SpO2: 95%   BP Readings from Last 3 Encounters:  11/24/21 118/68  09/09/21 120/70  06/16/21 138/74   Wt Readings from Last 3 Encounters:  11/24/21 130 lb 6.4 oz (59.1 kg)  09/09/21 128 lb 3.2 oz (58.2 kg)  06/16/21 127 lb (57.6 kg)   Body mass index is 25.47 kg/m.   Physical Exam    Constitutional: Appears well-developed and well-nourished. No distress.  HENT:  Head: Normocephalic and atraumatic.  Neck: Neck supple. No tracheal deviation present. No thyromegaly present.  No cervical lymphadenopathy Mouth: Moist, no erythema or exudate Cardiovascular: Normal rate, regular rhythm and normal heart sounds.   No murmur heard. No carotid bruit .  No edema Pulmonary/Chest: Effort normal and breath sounds normal. No respiratory distress. No has no wheezes. No rales.  Skin: Skin is warm and dry. Not diaphoretic.  Psychiatric: Normal mood and affect. Behavior is normal.      Assessment & Plan:    Hyperlipidemia: Chronic Check lipid panel  Continue livalo 2 mg daily   Hypertension:  Chronic BP well controlled Continue  cmp   Hypothyroidism: Chronic  Clinically euthyroid Currently taking NP thyroid 30 mg daily Check tsh  Titrate med dose if needed   GERD:  Chronic GERD controlled Continue protonix 40 mg daily  prn  Anxiety:    Chronic Controlled, stable Continue alprazolam 0.25 mg tid prn  Nasal congestion: Acute Residual from flu-had this 1 month ago Has persistent nasal congestion and intermittent throat irritation/soreness No evidence of active infection Advised taking over-the-counter oral antihistamine, can try Flonase, saline nasal spray or Mucinex

## 2021-11-20 NOTE — Patient Instructions (Addendum)
° °  Blood work was ordered.     Medications changes include :   try claritin or zyrtec daily.  Saline spray or flonase nasal spray.  You can also try mucinex.     Please followup in 6 months

## 2021-11-24 ENCOUNTER — Ambulatory Visit (INDEPENDENT_AMBULATORY_CARE_PROVIDER_SITE_OTHER): Payer: Medicare Other | Admitting: Internal Medicine

## 2021-11-24 ENCOUNTER — Other Ambulatory Visit: Payer: Self-pay

## 2021-11-24 VITALS — BP 118/68 | HR 68 | Temp 98.0°F | Ht 60.0 in | Wt 130.4 lb

## 2021-11-24 DIAGNOSIS — E785 Hyperlipidemia, unspecified: Secondary | ICD-10-CM

## 2021-11-24 DIAGNOSIS — R0981 Nasal congestion: Secondary | ICD-10-CM

## 2021-11-24 DIAGNOSIS — I10 Essential (primary) hypertension: Secondary | ICD-10-CM

## 2021-11-24 DIAGNOSIS — K219 Gastro-esophageal reflux disease without esophagitis: Secondary | ICD-10-CM | POA: Diagnosis not present

## 2021-11-24 DIAGNOSIS — E039 Hypothyroidism, unspecified: Secondary | ICD-10-CM

## 2021-11-24 LAB — COMPREHENSIVE METABOLIC PANEL
ALT: 16 U/L (ref 0–35)
AST: 18 U/L (ref 0–37)
Albumin: 4.3 g/dL (ref 3.5–5.2)
Alkaline Phosphatase: 56 U/L (ref 39–117)
BUN: 14 mg/dL (ref 6–23)
CO2: 28 mEq/L (ref 19–32)
Calcium: 9.4 mg/dL (ref 8.4–10.5)
Chloride: 104 mEq/L (ref 96–112)
Creatinine, Ser: 0.72 mg/dL (ref 0.40–1.20)
GFR: 79.27 mL/min (ref >60.00)
Glucose, Bld: 93 mg/dL (ref 70–99)
Potassium: 4 mEq/L (ref 3.5–5.1)
Sodium: 139 mEq/L (ref 135–145)
Total Bilirubin: 0.7 mg/dL (ref 0.2–1.2)
Total Protein: 7.1 g/dL (ref 6.0–8.3)

## 2021-11-24 LAB — CBC WITH DIFFERENTIAL/PLATELET
Basophils Absolute: 0 10*3/uL (ref 0.0–0.1)
Basophils Relative: 1.1 % (ref 0.0–3.0)
Eosinophils Absolute: 0.1 10*3/uL (ref 0.0–0.7)
Eosinophils Relative: 3.1 % (ref 0.0–5.0)
HCT: 42.1 % (ref 36.0–46.0)
Hemoglobin: 14 g/dL (ref 12.0–15.0)
Lymphocytes Relative: 40.2 % (ref 12.0–46.0)
Lymphs Abs: 1.2 10*3/uL (ref 0.7–4.0)
MCHC: 33.2 g/dL (ref 30.0–36.0)
MCV: 91.9 fl (ref 78.0–100.0)
Monocytes Absolute: 0.3 10*3/uL (ref 0.1–1.0)
Monocytes Relative: 11.2 % (ref 3.0–12.0)
Neutro Abs: 1.4 10*3/uL (ref 1.4–7.7)
Neutrophils Relative %: 44.4 % (ref 43.0–77.0)
Platelets: 204 10*3/uL (ref 150.0–400.0)
RBC: 4.58 Mil/uL (ref 3.87–5.11)
RDW: 14.1 % (ref 11.5–15.5)
WBC: 3 10*3/uL — ABNORMAL LOW (ref 4.0–10.5)

## 2021-11-24 LAB — LIPID PANEL
Cholesterol: 182 mg/dL (ref 0–200)
HDL: 74.2 mg/dL (ref >39.00)
LDL Cholesterol: 95 mg/dL (ref 0–99)
NonHDL: 108.19
Total CHOL/HDL Ratio: 2
Triglycerides: 65 mg/dL (ref 0.0–149.0)
VLDL: 13 mg/dL (ref 0.0–40.0)

## 2021-11-24 LAB — TSH: TSH: 5.09 u[IU]/mL (ref 0.35–5.50)

## 2021-12-11 DIAGNOSIS — Z1231 Encounter for screening mammogram for malignant neoplasm of breast: Secondary | ICD-10-CM | POA: Diagnosis not present

## 2021-12-11 LAB — HM MAMMOGRAPHY

## 2022-01-19 ENCOUNTER — Other Ambulatory Visit: Payer: Self-pay | Admitting: Internal Medicine

## 2022-01-19 DIAGNOSIS — E785 Hyperlipidemia, unspecified: Secondary | ICD-10-CM

## 2022-01-19 DIAGNOSIS — R000 Tachycardia, unspecified: Secondary | ICD-10-CM

## 2022-01-19 DIAGNOSIS — E039 Hypothyroidism, unspecified: Secondary | ICD-10-CM

## 2022-01-19 DIAGNOSIS — K219 Gastro-esophageal reflux disease without esophagitis: Secondary | ICD-10-CM

## 2022-01-26 ENCOUNTER — Encounter: Payer: Self-pay | Admitting: Gastroenterology

## 2022-01-27 DIAGNOSIS — Z780 Asymptomatic menopausal state: Secondary | ICD-10-CM | POA: Diagnosis not present

## 2022-01-27 DIAGNOSIS — M8589 Other specified disorders of bone density and structure, multiple sites: Secondary | ICD-10-CM | POA: Diagnosis not present

## 2022-01-27 LAB — HM DEXA SCAN

## 2022-02-22 ENCOUNTER — Encounter: Payer: Self-pay | Admitting: *Deleted

## 2022-02-22 ENCOUNTER — Encounter: Payer: Self-pay | Admitting: Cardiovascular Disease

## 2022-02-22 ENCOUNTER — Ambulatory Visit: Payer: Medicare Other | Admitting: Cardiovascular Disease

## 2022-02-22 VITALS — BP 132/70 | HR 67 | Ht 60.0 in | Wt 130.2 lb

## 2022-02-22 DIAGNOSIS — R079 Chest pain, unspecified: Secondary | ICD-10-CM

## 2022-02-22 DIAGNOSIS — I059 Rheumatic mitral valve disease, unspecified: Secondary | ICD-10-CM | POA: Diagnosis not present

## 2022-02-22 NOTE — Progress Notes (Signed)
? ?Chief Complaint  ?Patient presents with  ? New Patient (Initial Visit)  ?  Chest pain  ? ?History of Present Illness: 81 yo female with history of GERD, esophageal stricture, hiatal hernia, hyperlipidemia, hypothyroidism, and IBS here today as a new patient for the evaluation of chest pain. She has been seen in our office in the past by Dr. Harrington Challenger. Last visit in our office in September 2019. Normal stress test in September 2019. Echo in 2015 with normal LV function, MAC. She tells me today that she has had pain in the center when she wakes up. She usually takes an ASA and Xanax and it feels better. She has no exertional chest pain. No dyspnea or palpitations. No dizziness, near syncope or syncope.  ? ?Primary Care Physician: Janith Lima, MD ? ? ?Past Medical History:  ?Diagnosis Date  ? CHEST PAIN 04/30/2009  ? COMMON MIGRAINE 04/30/2009  ? CONSTIPATION 04/30/2009  ? ESOPHAGEAL STRICTURE 04/30/2009  ? GERD 04/30/2009  ? HIATAL HERNIA 04/30/2009  ? Hx of cardiovascular stress test   ? ETT-Myoview (04/2014):  No ischemia, EF 85%; Normal // Myoview 11/17: EF 92, no scar or ischemia, Low Risk // Nuclear stress test 9/19: EF 84, no ischemia; Low Risk   ? Hx of echocardiogram   ? Echo (12/15):  EF 55-60%, no RWMA, normal diast function, MAC, LA 33 mm  ? Hyperlipidemia   ? HYPOTHYROIDISM 04/30/2009  ? Irritable bowel syndrome 04/30/2009  ? RAYNAUD'S DISEASE 04/30/2009  ? ? ?Past Surgical History:  ?Procedure Laterality Date  ? ABDOMINAL HYSTERECTOMY  1976  ? COLONOSCOPY    ? ECTOPIC PREGNANCY SURGERY  1970  ? foot surgury  1997  ? RHINOPLASTY  1980  ? TONSILLECTOMY  1990  ? UPPER GASTROINTESTINAL ENDOSCOPY    ? ? ?Current Outpatient Medications  ?Medication Sig Dispense Refill  ? ALPRAZolam (XANAX) 0.25 MG tablet TAKE ONE TABLET BY MOUTH THREE TIMES DAILY AS NEEDED FOR SLEEP OR ANXIETY 180 tablet 1  ? aspirin 325 MG EC tablet Take 325 mg by mouth as needed for pain.    ? Biotin 5000 MCG CAPS Take by mouth.    ? carvedilol (COREG) 3.125  MG tablet TAKE 1 TABLET BY MOUTH TWICE DAILY WITH A MEAL 180 tablet 0  ? Cholecalciferol (VITAMIN D3) 5000 UNITS TABS Take 1 tablet by mouth daily.    ? Coenzyme Q10 (COQ10) 100 MG CAPS Take by mouth.    ? Cyanocobalamin (VITAMIN B-12 CR) 1500 MCG TBCR Take 1 tablet by mouth 3 (three) times a week.    ? LIVALO 2 MG TABS Take 1 tablet by mouth once daily 90 tablet 0  ? NP THYROID 30 MG tablet TAKE 1 TABLET BY MOUTH ONCE DAILY BEFORE BREAKFAST 90 tablet 0  ? Omega-3 Fatty Acids (FISH OIL) 1000 MG CAPS Take 2 capsules by mouth daily.    ? pantoprazole (PROTONIX) 40 MG tablet TAKE 1 TABLET BY MOUTH ONCE DAILY AS NEEDED FOR  ACID  REFLUX 90 tablet 0  ? zinc gluconate 50 MG tablet Take 50 mg by mouth daily.    ? ?No current facility-administered medications for this visit.  ? ? ?No Active Allergies ? ? ?Social History  ? ?Socioeconomic History  ? Marital status: Widowed  ?  Spouse name: Not on file  ? Number of children: 2  ? Years of education: Not on file  ? Highest education level: Not on file  ?Occupational History  ? Occupation: Sales promotion account executive  ?  Employer: RETIRED  ? Occupation: Retired-worked for [employer] and [employer]  ?Tobacco Use  ? Smoking status: Former  ?  Types: Cigarettes  ?  Quit date: 10/27/1984  ?  Years since quitting: 37.3  ? Smokeless tobacco: Never  ? Tobacco comments:  ?  quit 1985  ?Vaping Use  ? Vaping Use: Never used  ?Substance and Sexual Activity  ? Alcohol use: Yes  ?  Alcohol/week: 0.0 standard drinks  ?  Comment: 1-2 per week  ? Drug use: No  ? Sexual activity: Not Currently  ?  Birth control/protection: Post-menopausal  ?Other Topics Concern  ? Not on file  ?Social History Narrative  ? Patients gets regular exercise.  ? Patient lives alone in a one story home with a basement.  She is a widow.  Has 2 children.  Retired from [employer].  Education: some college.  ? ?Social Determinants of Health  ? ?Financial Resource Strain: Low Risk   ? Difficulty of Paying  Living Expenses: Not hard at all  ?Food Insecurity: No Food Insecurity  ? Worried About Charity fundraiser in the Last Year: Never true  ? Ran Out of Food in the Last Year: Never true  ?Transportation Needs: No Transportation Needs  ? Lack of Transportation (Medical): No  ? Lack of Transportation (Non-Medical): No  ?Physical Activity: Sufficiently Active  ? Days of Exercise per Week: 7 days  ? Minutes of Exercise per Session: 60 min  ?Stress: No Stress Concern Present  ? Feeling of Stress : Not at all  ?Social Connections: Moderately Integrated  ? Frequency of Communication with Friends and Family: More than three times a week  ? Frequency of Social Gatherings with Friends and Family: More than three times a week  ? Attends Religious Services: More than 4 times per year  ? Active Member of Clubs or Organizations: Not on file  ? Attends Archivist Meetings: More than 4 times per year  ? Marital Status: Widowed  ?Intimate Partner Violence: Not At Risk  ? Fear of Current or Ex-Partner: No  ? Emotionally Abused: No  ? Physically Abused: No  ? Sexually Abused: No  ? ? ?Family History  ?Problem Relation Age of Onset  ? Heart disease Father   ? Heart attack Father 57  ? Cancer Sister   ?     breast cancer  ? Diabetes Maternal Grandmother   ? Heart disease Paternal Grandmother   ? Heart disease Paternal Grandfather   ? Migraines Daughter   ? Diabetes Maternal Aunt   ? Diabetes Maternal Uncle   ? Cancer Paternal Aunt   ?     lung cancer  ? Stroke Cousin   ? Colon cancer Neg Hx   ? ? ?Review of Systems:  As stated in the HPI and otherwise negative.  ? ?BP 132/70   Pulse 67   Ht 5' (1.524 m)   Wt 130 lb 3.2 oz (59.1 kg)   SpO2 96%   BMI 25.43 kg/m?  ? ?Physical Examination: ?General: Well developed, well nourished, NAD  ?HEENT: OP clear, mucus membranes moist  ?SKIN: warm, dry. No rashes. ?Neuro: No focal deficits  ?Musculoskeletal: Muscle strength 5/5 all ext  ?Psychiatric: Mood and affect normal  ?Neck: No  JVD, no carotid bruits, no thyromegaly, no lymphadenopathy.  ?Lungs:Clear bilaterally, no wheezes, rhonci, crackles ?Cardiovascular: Regular rate and rhythm. No murmurs, gallops or rubs. ?Abdomen:Soft. Bowel sounds present. Non-tender.  ?Extremities: No lower extremity edema.  Pulses are 2 + in the bilateral DP/PT. ? ?EKG:  EKG is ordered today. ?The ekg ordered today demonstrates sinus ? ?Recent Labs: ?11/24/2021: ALT 16; BUN 14; Creatinine, Ser 0.72; Hemoglobin 14.0; Platelets 204.0; Potassium 4.0; Sodium 139; TSH 5.09  ? ?Lipid Panel ?   ?Component Value Date/Time  ? CHOL 182 11/24/2021 0933  ? TRIG 65.0 11/24/2021 0933  ? HDL 74.20 11/24/2021 0933  ? CHOLHDL 2 11/24/2021 0933  ? VLDL 13.0 11/24/2021 0933  ? [place] 95 11/24/2021 0933  ? LDLDIRECT 130.0 08/03/2011 0851  ? ?  ?Wt Readings from Last 3 Encounters:  ?02/22/22 130 lb 3.2 oz (59.1 kg)  ?11/24/21 130 lb 6.4 oz (59.1 kg)  ?09/09/21 128 lb 3.2 oz (58.2 kg)  ?  ? ? ?Assessment and Plan:  ? ?1.  Chest pain: Mostly atypical features but given age and other risk factors including HTN and HLD, will arrange an exercise nuclear stress to exclude ischemia.  ? ?2. 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 Will arrange an echo now to assess.  ? ?Labs/ tests ordered today include:  ? ?Orders Placed This Encounter  ?Procedures  ? MYOCARDIAL PERFUSION IMAGING  ? EKG 12-Lead  ? ECHOCARDIOGRAM COMPLETE  ? ? ? ?Disposition:   F/U with me in 2-3 months.  ? ? ?Signed, ?Lauree Chandler, MD ?02/22/2022 9:23 AM    ?[facility] [address], [place], [place]  [address] ?Phone: [phone]; Fax: [phone]  ? ? ?

## 2022-02-22 NOTE — Patient Instructions (Signed)
Medication Instructions:  ?No changes ?*If you need a refill on your cardiac medications before your next appointment, please call your pharmacy* ? ? ?Lab Work: ?none ?If you have labs (blood work) drawn today and your tests are completely normal, you will receive your results only by: ?MyChart Message (if you have MyChart) OR ?A paper copy in the mail ?If you have any lab test that is abnormal or we need to change your treatment, we will call you to review the results. ? ? ?Testing/Procedures: ?Your physician has requested that you have an echocardiogram. Echocardiography is a painless test that uses sound waves to create images of your heart. It provides your doctor with information about the size and shape of your heart and how well your heart?s chambers and valves are working. This procedure takes approximately one hour. There are no restrictions for this procedure. ? ?Your physician has requested that you have en exercise stress myoview. For further information please visit [email or web address]. Please follow instruction sheet, as given. ? ? ?Follow-Up: ?At [facility], you and your health needs are our priority.  As part of our continuing mission to provide you with exceptional heart care, we have created designated Provider Care Teams.  These Care Teams include your primary Cardiologist (physician) and Advanced Practice Providers (APPs -  Physician Assistants and Nurse Practitioners) who all work together to provide you with the care you need, when you need it. ? ? ?Your next appointment:   ?12 month(s) ? ?The format for your next appointment:   ?In Person ? ?Provider:   ?Lauree Chandler, MD   ? ? ?Other Instructions ?  ?

## 2022-03-05 ENCOUNTER — Telehealth (HOSPITAL_COMMUNITY): Payer: Self-pay | Admitting: *Deleted

## 2022-03-05 NOTE — Telephone Encounter (Signed)
Patient given detailed instructions per Myocardial Perfusion Study Information Sheet for the test on 03/12/2022 at 10:30. Patient notified to arrive 15 minutes early and that it is imperative to arrive on time for appointment to keep from having the test rescheduled. ? If you need to cancel or reschedule your appointment, please call the office within 24 hours of your appointment. . Patient verbalized understanding.[name] ? ? ? ?

## 2022-03-10 ENCOUNTER — Ambulatory Visit: Payer: Medicare Other | Admitting: Dermatology

## 2022-03-10 ENCOUNTER — Encounter: Payer: Self-pay | Admitting: Dermatology

## 2022-03-10 DIAGNOSIS — Z1283 Encounter for screening for malignant neoplasm of skin: Secondary | ICD-10-CM | POA: Diagnosis not present

## 2022-03-10 DIAGNOSIS — L719 Rosacea, unspecified: Secondary | ICD-10-CM

## 2022-03-10 MED ORDER — METRONIDAZOLE 0.75 % EX GEL: 1.0000 "application " | Freq: Two times a day (BID) | CUTANEOUS | 0 refills | Status: DC

## 2022-03-12 ENCOUNTER — Ambulatory Visit (HOSPITAL_BASED_OUTPATIENT_CLINIC_OR_DEPARTMENT_OTHER): Payer: Medicare Other

## 2022-03-12 ENCOUNTER — Ambulatory Visit (HOSPITAL_COMMUNITY): Payer: Medicare Other | Attending: Internal Medicine

## 2022-03-12 DIAGNOSIS — R079 Chest pain, unspecified: Secondary | ICD-10-CM

## 2022-03-12 DIAGNOSIS — I059 Rheumatic mitral valve disease, unspecified: Secondary | ICD-10-CM | POA: Diagnosis not present

## 2022-03-12 LAB — MYOCARDIAL PERFUSION IMAGING
Angina Index: 0
Duke Treadmill Score: 6
Estimated workload: 7
Exercise duration (min): 6 min
Exercise duration (sec): 0 s
LV dias vol: 32 mL (ref 46–106)
LV sys vol: 4 mL
MPHR: 140 {beats}/min
Nuc Stress EF: 88 %
Peak HR: 134 {beats}/min
Percent HR: 95 %
Rest HR: 84 {beats}/min
Rest Nuclear Isotope Dose: 9.6 mCi
SDS: 1
SRS: 0
SSS: 1
ST Depression (mm): 0.5 mm
Stress Nuclear Isotope Dose: 30.9 mCi
TID: 1.02

## 2022-03-12 LAB — ECHOCARDIOGRAM COMPLETE
Area-P 1/2: 3.17 cm2
S' Lateral: 2 cm

## 2022-03-12 MED ORDER — TECHNETIUM TC 99M TETROFOSMIN IV KIT
30.9000 | PACK | Freq: Once | INTRAVENOUS | Status: AC | PRN
  Administered 2022-03-12: 30.9 via INTRAVENOUS
  Filled 2022-03-12: qty 31

## 2022-03-12 MED ORDER — TECHNETIUM TC 99M TETROFOSMIN IV KIT
9.6000 | PACK | Freq: Once | INTRAVENOUS | Status: AC | PRN
  Administered 2022-03-12: 9.6 via INTRAVENOUS
  Filled 2022-03-12: qty 10

## 2022-03-28 ENCOUNTER — Encounter: Payer: Self-pay | Admitting: Dermatology

## 2022-03-28 NOTE — Progress Notes (Signed)
? ?  Follow-Up Visit ?  ?Subjective  ?[name] is a 81 y.o. female who presents for the following: Annual Exam (Places/ bumps on chin x 4 weeks- that wont go way ). ? ?Annual skin check, she is noted bumps on her face for the past month ?Location:  ?Duration:  ?Quality:  ?Associated Signs/Symptoms: ?Modifying Factors:  ?Severity:  ?Timing: ?Context:  ? ?Objective  ?Well appearing patient in no apparent distress; mood and affect are within normal limits. ?Full body examination: No atypical pigmented lesions or nonmelanoma skin cancer. ? ?Head - Anterior (Face) ?Inflammatory 3 mm papules lower central face; clinically intergrade of rosacea versus acne/folliculitis ? ? ? ?A full examination was performed including scalp, head, eyes, ears, nose, lips, neck, chest, axillae, abdomen, back, buttocks, bilateral upper extremities, bilateral lower extremities, hands, feet, fingers, toes, fingernails, and toenails. All findings within normal limits unless otherwise noted below.  Areas beneath undergarments not fully examined. ? ? ?Assessment & Plan  ? ? ?Encounter for screening for malignant neoplasm of skin ? ?Annual skin examination ? ?Rosacea ?Head - Anterior (Face) ? ?Topical metronidazole daily for 6 weeks (her insurance would not cover Soolantra); follow-up by MyChart or telephone at that time ? ?metroNIDAZOLE (METROGEL) 0.75 % gel - Head - Anterior (Face) ?Apply 1 application. topically 2 (two) times daily. ? ? ? ? ? ?I, Lavonna Monarch, MD, have reviewed all documentation for this visit.  The documentation on 03/28/22 for the exam, diagnosis, procedures, and orders are all accurate and complete. ?

## 2022-04-08 DIAGNOSIS — H6121 Impacted cerumen, right ear: Secondary | ICD-10-CM | POA: Diagnosis not present

## 2022-04-08 DIAGNOSIS — H6993 Unspecified Eustachian tube disorder, bilateral: Secondary | ICD-10-CM | POA: Insufficient documentation

## 2022-04-08 DIAGNOSIS — H9113 Presbycusis, bilateral: Secondary | ICD-10-CM | POA: Insufficient documentation

## 2022-04-08 DIAGNOSIS — H6983 Other specified disorders of Eustachian tube, bilateral: Secondary | ICD-10-CM | POA: Diagnosis not present

## 2022-04-08 DIAGNOSIS — H903 Sensorineural hearing loss, bilateral: Secondary | ICD-10-CM | POA: Diagnosis not present

## 2022-04-17 ENCOUNTER — Other Ambulatory Visit: Payer: Self-pay | Admitting: Internal Medicine

## 2022-04-17 DIAGNOSIS — K219 Gastro-esophageal reflux disease without esophagitis: Secondary | ICD-10-CM

## 2022-04-17 DIAGNOSIS — E785 Hyperlipidemia, unspecified: Secondary | ICD-10-CM

## 2022-04-17 DIAGNOSIS — R000 Tachycardia, unspecified: Secondary | ICD-10-CM

## 2022-04-22 ENCOUNTER — Telehealth: Payer: Medicare Other

## 2022-05-05 ENCOUNTER — Other Ambulatory Visit: Payer: Self-pay | Admitting: Internal Medicine

## 2022-05-05 DIAGNOSIS — E039 Hypothyroidism, unspecified: Secondary | ICD-10-CM

## 2022-05-11 DIAGNOSIS — H43813 Vitreous degeneration, bilateral: Secondary | ICD-10-CM | POA: Diagnosis not present

## 2022-05-11 DIAGNOSIS — H0102A Squamous blepharitis right eye, upper and lower eyelids: Secondary | ICD-10-CM | POA: Diagnosis not present

## 2022-05-11 DIAGNOSIS — H0102B Squamous blepharitis left eye, upper and lower eyelids: Secondary | ICD-10-CM | POA: Diagnosis not present

## 2022-05-11 DIAGNOSIS — H16223 Keratoconjunctivitis sicca, not specified as Sjogren's, bilateral: Secondary | ICD-10-CM | POA: Diagnosis not present

## 2022-05-11 DIAGNOSIS — Z961 Presence of intraocular lens: Secondary | ICD-10-CM | POA: Diagnosis not present

## 2022-05-11 DIAGNOSIS — H40013 Open angle with borderline findings, low risk, bilateral: Secondary | ICD-10-CM | POA: Diagnosis not present

## 2022-05-12 ENCOUNTER — Telehealth: Payer: Medicare Other

## 2022-05-13 DIAGNOSIS — H903 Sensorineural hearing loss, bilateral: Secondary | ICD-10-CM | POA: Diagnosis not present

## 2022-06-16 ENCOUNTER — Ambulatory Visit (INDEPENDENT_AMBULATORY_CARE_PROVIDER_SITE_OTHER): Payer: Medicare Other | Admitting: Internal Medicine

## 2022-06-16 ENCOUNTER — Ambulatory Visit (INDEPENDENT_AMBULATORY_CARE_PROVIDER_SITE_OTHER): Payer: Medicare Other

## 2022-06-16 ENCOUNTER — Other Ambulatory Visit: Payer: Self-pay | Admitting: Internal Medicine

## 2022-06-16 ENCOUNTER — Encounter: Payer: Self-pay | Admitting: Internal Medicine

## 2022-06-16 VITALS — BP 138/72 | HR 61 | Temp 98.1°F | Resp 16 | Ht 60.0 in | Wt 118.0 lb

## 2022-06-16 DIAGNOSIS — M25551 Pain in right hip: Secondary | ICD-10-CM

## 2022-06-16 DIAGNOSIS — K219 Gastro-esophageal reflux disease without esophagitis: Secondary | ICD-10-CM

## 2022-06-16 DIAGNOSIS — E785 Hyperlipidemia, unspecified: Secondary | ICD-10-CM

## 2022-06-16 DIAGNOSIS — E039 Hypothyroidism, unspecified: Secondary | ICD-10-CM | POA: Diagnosis not present

## 2022-06-16 DIAGNOSIS — I10 Essential (primary) hypertension: Secondary | ICD-10-CM | POA: Diagnosis not present

## 2022-06-16 LAB — CBC WITH DIFFERENTIAL/PLATELET
Basophils Absolute: 0 10*3/uL (ref 0.0–0.1)
Basophils Relative: 1 % (ref 0.0–3.0)
Eosinophils Absolute: 0.1 10*3/uL (ref 0.0–0.7)
Eosinophils Relative: 3.6 % (ref 0.0–5.0)
HCT: 41.1 % (ref 36.0–46.0)
Hemoglobin: 13.6 g/dL (ref 12.0–15.0)
Lymphocytes Relative: 44.9 % (ref 12.0–46.0)
Lymphs Abs: 1.5 10*3/uL (ref 0.7–4.0)
MCHC: 33.2 g/dL (ref 30.0–36.0)
MCV: 92.7 fl (ref 78.0–100.0)
Monocytes Absolute: 0.3 10*3/uL (ref 0.1–1.0)
Monocytes Relative: 10 % (ref 3.0–12.0)
Neutro Abs: 1.4 10*3/uL (ref 1.4–7.7)
Neutrophils Relative %: 40.5 % — ABNORMAL LOW (ref 43.0–77.0)
Platelets: 171 10*3/uL (ref 150.0–400.0)
RBC: 4.43 Mil/uL (ref 3.87–5.11)
RDW: 14.4 % (ref 11.5–15.5)
WBC: 3.4 10*3/uL — ABNORMAL LOW (ref 4.0–10.5)

## 2022-06-16 LAB — BASIC METABOLIC PANEL
BUN: 8 mg/dL (ref 6–23)
CO2: 31 mEq/L (ref 19–32)
Calcium: 9.7 mg/dL (ref 8.4–10.5)
Chloride: 103 mEq/L (ref 96–112)
Creatinine, Ser: 0.68 mg/dL (ref 0.40–1.20)
GFR: 82.24 mL/min (ref >60.00)
Glucose, Bld: 78 mg/dL (ref 70–99)
Potassium: 4 mEq/L (ref 3.5–5.1)
Sodium: 139 mEq/L (ref 135–145)

## 2022-06-16 LAB — LIPID PANEL
Cholesterol: 153 mg/dL (ref 0–200)
HDL: 69.4 mg/dL (ref >39.00)
LDL Cholesterol: 72 mg/dL (ref 0–99)
NonHDL: 83.19
Total CHOL/HDL Ratio: 2
Triglycerides: 55 mg/dL (ref 0.0–149.0)
VLDL: 11 mg/dL (ref 0.0–40.0)

## 2022-06-16 LAB — TSH: TSH: 3.18 u[IU]/mL (ref 0.35–5.50)

## 2022-06-16 NOTE — Progress Notes (Unsigned)
Subjective:  Patient ID: [name], female    DOB: May 29, 1941  Age: 81 y.o. MRN: [id number]  CC: Hypertension, Hypothyroidism, and Hyperlipidemia   HPI [name] presents for f/up -  She walks on her treadmill about 45 minutes a day.  She does not experience chest pain, shortness of breath, diaphoresis, or edema.  She complains of a 1 week history of pain in her right posterior hip.  Outpatient Medications Prior to Visit  Medication Sig Dispense Refill   ALPRAZolam (XANAX) 0.25 MG tablet TAKE ONE TABLET BY MOUTH THREE TIMES DAILY AS NEEDED FOR SLEEP OR ANXIETY 180 tablet 1   aspirin 325 MG EC tablet Take 325 mg by mouth as needed for pain.     Biotin 5000 MCG CAPS Take by mouth.     carvedilol (COREG) 3.125 MG tablet TAKE 1 TABLET BY MOUTH TWICE DAILY WITH A MEAL 180 tablet 0   Cholecalciferol (VITAMIN D3) 5000 UNITS TABS Take 1 tablet by mouth daily.     Coenzyme Q10 (COQ10) 100 MG CAPS Take by mouth.     Cyanocobalamin (VITAMIN B-12 CR) 1500 MCG TBCR Take 1 tablet by mouth 3 (three) times a week.     LIVALO 2 MG TABS Take 1 tablet by mouth once daily 90 tablet 0   metroNIDAZOLE (METROGEL) 0.75 % gel Apply 1 application. topically 2 (two) times daily. 45 g 0   NP THYROID 30 MG tablet TAKE 1 TABLET BY MOUTH ONCE DAILY BEFORE BREAKFAST 90 tablet 0   Omega-3 Fatty Acids (FISH OIL) 1000 MG CAPS Take 2 capsules by mouth daily.     pantoprazole (PROTONIX) 40 MG tablet TAKE 1 TABLET BY MOUTH ONCE DAILY AS NEEDED FOR ACID REFLUX 90 tablet 0   zinc gluconate 50 MG tablet Take 50 mg by mouth daily.     No facility-administered medications prior to visit.    ROS Review of Systems  Constitutional:  Positive for fatigue. Negative for diaphoresis and unexpected weight change.  HENT: Negative.    Eyes: Negative.   Respiratory: Negative.  Negative for cough, chest tightness, shortness of breath and wheezing.   Cardiovascular:  Negative for chest pain, palpitations and leg  swelling.  Gastrointestinal:  Positive for constipation. Negative for abdominal pain, blood in stool, diarrhea, nausea and vomiting.  Endocrine: Negative.   Genitourinary: Negative.  Negative for difficulty urinating.  Musculoskeletal:  Positive for arthralgias. Negative for back pain, joint swelling and myalgias.  Neurological:  Negative for dizziness, weakness, light-headedness and headaches.  Hematological:  Negative for adenopathy. Does not bruise/bleed easily.  Psychiatric/Behavioral: Negative.      Objective:  BP 138/72 (BP Location: Left Arm, Patient Position: Sitting, Cuff Size: Normal)   Pulse 61   Temp 98.1 F (36.7 C) (Oral)   Resp 16   Ht 5' (1.524 m)   Wt 118 lb (53.5 kg)   SpO2 96%   BMI 23.05 kg/m   BP Readings from Last 3 Encounters:  06/16/22 138/72  02/22/22 132/70  11/24/21 118/68    Wt Readings from Last 3 Encounters:  06/16/22 118 lb (53.5 kg)  02/22/22 130 lb 3.2 oz (59.1 kg)  11/24/21 130 lb 6.4 oz (59.1 kg)    Physical Exam Vitals reviewed.  HENT:     Nose: Nose normal.     Mouth/Throat:     Mouth: Mucous membranes are moist.  Eyes:     General: No scleral icterus.    Conjunctiva/sclera: Conjunctivae normal.  Cardiovascular:  Rate and Rhythm: Normal rate and regular rhythm.     Heart sounds: No murmur heard. Pulmonary:     Effort: Pulmonary effort is normal.     Breath sounds: No stridor. No wheezing, rhonchi or rales.  Abdominal:     Palpations: There is no mass.     Tenderness: There is no abdominal tenderness. There is no guarding.     Hernia: No hernia is present.  Musculoskeletal:     Cervical back: Neck supple.     Right hip: Normal.     Left hip: Normal.  Lymphadenopathy:     Cervical: No cervical adenopathy.  Skin:    General: Skin is warm and dry.  Neurological:     General: No focal deficit present.     Mental Status: She is alert.  Psychiatric:        Mood and Affect: Mood normal.        Behavior: Behavior normal.      Lab Results  Component Value Date   WBC 3.4 (L) 06/16/2022   HGB 13.6 06/16/2022   HCT 41.1 06/16/2022   PLT 171.0 06/16/2022   GLUCOSE 78 06/16/2022   CHOL 153 06/16/2022   TRIG 55.0 06/16/2022   HDL 69.40 06/16/2022   LDLDIRECT 130.0 08/03/2011   LDLCALC 72 06/16/2022   ALT 16 11/24/2021   AST 18 11/24/2021   NA 139 06/16/2022   K 4.0 06/16/2022   CL 103 06/16/2022   CREATININE 0.68 06/16/2022   BUN 8 06/16/2022   CO2 31 06/16/2022   TSH 3.18 06/16/2022   HGBA1C 5.5 06/06/2019    DG Thoracic Spine W/Swimmers  Result Date: 08/20/2019 CLINICAL DATA:  Chest pain, no known injury, initial encounter EXAM: THORACIC SPINE - 3 VIEWS COMPARISON:  04/15/2011 FINDINGS: Vertebral body height is well maintained. Multilevel osteophytic changes are seen. No acute compression deformity is noted. No paraspinal mass is seen. IMPRESSION: Degenerative change without acute abnormality. Electronically Signed   By: Inez Catalina M.D.   On: 08/20/2019 14:15   DG Chest 2 View  Result Date: 08/20/2019 CLINICAL DATA:  Thoracic spine pain radiating into the chest for 3 weeks. EXAM: CHEST - 2 VIEW COMPARISON:  PA and lateral chest 04/05/2014. FINDINGS: Lungs are clear. Heart size is normal. No pneumothorax or pleural fluid. No acute or focal bony abnormality. Multilevel anterior endplate spurring appears unchanged. IMPRESSION: No acute disease. 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 Electronically Signed   By: Inge Rise M.D.   On: 08/20/2019 14:07   DG HIP UNILAT WITH PELVIS 2-3 VIEWS RIGHT  Result Date: 06/16/2022 CLINICAL DATA:  Intermittent right hip pain for 2-3 months. EXAM: DG HIP (WITH OR WITHOUT PELVIS) 2-3V RIGHT COMPARISON:  None Available. FINDINGS: There is diffuse decreased bone mineralization. Mild bilateral sacroiliac subchondral sclerosis. Mild bilateral superolateral acetabular degenerative osteophytosis. The bilateral femoroacetabular joint spaces are maintained.  Mild pubic symphysis joint space narrowing and peripheral osteophytosis. No acute fracture is seen. No dislocation. IMPRESSION: Very mild right femoroacetabular osteoarthritis. Electronically Signed   By: Yvonne Kendall M.D.   On: 06/16/2022 11:04     Assessment & Plan:   [name] was seen today for hypertension, hypothyroidism and hyperlipidemia.  Diagnoses and all orders for this visit:  Primary hypertension-her blood pressure is well controlled. -     TSH; Future -     Basic metabolic panel; Future -     CBC with Differential/Platelet; Future -     CBC with Differential/Platelet -  Basic metabolic panel -     TSH  Acquired hypothyroidism-she is euthyroid. -     TSH; Future -     TSH  Gastroesophageal reflux disease without esophagitis -     CBC with Differential/Platelet; Future -     CBC with Differential/Platelet  Hyperlipidemia with target LDL less than 130-LDL goal achieved. 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  She does not wish to pursue treatment options. -     Cancel: DG HIP UNILAT WITH PELVIS MIN 4 VIEWS RIGHT; Future -     CBC with Differential/Platelet; Future -     CBC with Differential/Platelet   I am having [name] maintain her Vitamin B-12 CR, Fish Oil, Vitamin D3, Biotin, CoQ10, zinc gluconate, ALPRAZolam, aspirin EC, metroNIDAZOLE, pantoprazole, carvedilol, Livalo, and NP Thyroid.  No orders of the defined types were placed in this encounter.    Follow-up: Return in about 6 months (around 12/17/2022).  Scarlette Calico, MD

## 2022-06-16 NOTE — Patient Instructions (Signed)

## 2022-06-17 ENCOUNTER — Encounter: Payer: Self-pay | Admitting: Internal Medicine

## 2022-07-24 ENCOUNTER — Other Ambulatory Visit: Payer: Self-pay | Admitting: Internal Medicine

## 2022-07-24 DIAGNOSIS — E039 Hypothyroidism, unspecified: Secondary | ICD-10-CM

## 2022-08-14 ENCOUNTER — Other Ambulatory Visit: Payer: Self-pay | Admitting: Internal Medicine

## 2022-08-14 DIAGNOSIS — E785 Hyperlipidemia, unspecified: Secondary | ICD-10-CM

## 2022-09-10 ENCOUNTER — Ambulatory Visit (INDEPENDENT_AMBULATORY_CARE_PROVIDER_SITE_OTHER): Payer: Medicare Other

## 2022-09-10 DIAGNOSIS — Z0000 Encounter for general adult medical examination without abnormal findings: Secondary | ICD-10-CM

## 2022-09-10 NOTE — Patient Instructions (Signed)
[name] , Thank you for taking time to come for your Medicare Wellness Visit. I appreciate your ongoing commitment to your health goals. Please review the following plan we discussed and let me know if I can assist you in the future.   These are the goals we discussed:  Goals      Maintain current health status     Continue to travel, enjoy life, family, friends, church.        This is a list of the screening recommended for you and due dates:  Health Maintenance  Topic Date Due   COVID-19 Vaccine (4 - Moderna series) 10/14/2021   Tetanus Vaccine  08/30/2026   Pneumonia Vaccine  Completed   Flu Shot  Completed   DEXA scan (bone density measurement)  Completed   Zoster (Shingles) Vaccine  Completed   HPV Vaccine  Aged Out    Advanced directives: Yes  Conditions/risks identified: Yes  Next appointment: Follow up in one year for your annual wellness visit.   Preventive Care 80 Years and Older, Female Preventive care refers to lifestyle choices and visits with your health care provider that can promote health and wellness. What does preventive care include? A yearly physical exam. This is also called an annual well check. Dental exams once or twice a year. Routine eye exams. Ask your health care provider how often you should have your eyes checked. Personal lifestyle choices, including: Daily care of your teeth and gums. Regular physical activity. Eating a healthy diet. Avoiding tobacco and drug use. Limiting alcohol use. Practicing safe sex. Taking low-dose aspirin every day. Taking vitamin and mineral supplements as recommended by your health care provider. What happens during an annual well check? The services and screenings done by your health care provider during your annual well check will depend on your age, overall health, lifestyle risk factors, and family history of disease. Counseling  Your health care provider may ask you questions about your: Alcohol  use. Tobacco use. Drug use. Emotional well-being. Home and relationship well-being. Sexual activity. Eating habits. History of falls. Memory and ability to understand (cognition). Work and work Statistician. Reproductive health. Screening  You may have the following tests or measurements: Height, weight, and BMI. Blood pressure. Lipid and cholesterol levels. These may be checked every 5 years, or more frequently if you are over 80 years old. Skin check. Lung cancer screening. You may have this screening every year starting at age 58 if you have a 30-pack-year history of smoking and currently smoke or have quit within the past 15 years. Fecal occult blood test (FOBT) of the stool. You may have this test every year starting at age 86. Flexible sigmoidoscopy or colonoscopy. You may have a sigmoidoscopy every 5 years or a colonoscopy every 10 years starting at age 37. Hepatitis C blood test. Hepatitis B blood test. Sexually transmitted disease (STD) testing. Diabetes screening. This is done by checking your blood sugar (glucose) after you have not eaten for a while (fasting). You may have this done every 1-3 years. Bone density scan. This is done to screen for osteoporosis. You may have this done starting at age 7. Mammogram. This may be done every 1-2 years. Talk to your health care provider about how often you should have regular mammograms. Talk with your health care provider about your test results, treatment options, and if necessary, the need for more tests. Vaccines  Your health care provider may recommend certain vaccines, such as: Influenza vaccine. This is  recommended every year. Tetanus, diphtheria, and acellular pertussis (Tdap, Td) vaccine. You may need a Td booster every 10 years. Zoster vaccine. You may need this after age 49. Pneumococcal 13-valent conjugate (PCV13) vaccine. One dose is recommended after age 53. Pneumococcal polysaccharide (PPSV23) vaccine. One dose is  recommended after age 46. Talk to your health care provider about which screenings and vaccines you need and how often you need them. This information is not intended to replace advice given to you by your health care provider. Make sure you discuss any questions you have with your health care provider. Document Released: 12/05/2015 Document Revised: 07/28/2016 Document Reviewed: 09/09/2015 Elsevier Interactive Patient Education  2017 [place] Prevention in the Home Falls can cause injuries. They can happen to people of all ages. There are many things you can do to make your home safe and to help prevent falls. What can I do on the outside of my home? Regularly fix the edges of walkways and driveways and fix any cracks. Remove anything that might make you trip as you walk through a door, such as a raised step or threshold. Trim any bushes or trees on the path to your home. Use bright outdoor lighting. Clear any walking paths of anything that might make someone trip, such as rocks or tools. Regularly check to see if handrails are loose or broken. Make sure that both sides of any steps have handrails. Any raised decks and porches should have guardrails on the edges. Have any leaves, snow, or ice cleared regularly. Use sand or salt on walking paths during winter. Clean up any spills in your garage right away. This includes oil or grease spills. What can I do in the bathroom? Use night lights. Install grab bars by the toilet and in the tub and shower. Do not use towel bars as grab bars. Use non-skid mats or decals in the tub or shower. If you need to sit down in the shower, use a plastic, non-slip stool. Keep the floor dry. Clean up any water that spills on the floor as soon as it happens. Remove soap buildup in the tub or shower regularly. Attach bath mats securely with double-sided non-slip rug tape. Do not have throw rugs and other things on the floor that can make you  trip. What can I do in the bedroom? Use night lights. Make sure that you have a light by your bed that is easy to reach. Do not use any sheets or blankets that are too big for your bed. They should not hang down onto the floor. Have a firm chair that has side arms. You can use this for support while you get dressed. Do not have throw rugs and other things on the floor that can make you trip. What can I do in the kitchen? Clean up any spills right away. Avoid walking on wet floors. Keep items that you use a lot in easy-to-reach places. If you need to reach something above you, use a strong step stool that has a grab bar. Keep electrical cords out of the way. Do not use floor polish or wax that makes floors slippery. If you must use wax, use non-skid floor wax. Do not have throw rugs and other things on the floor that can make you trip. What can I do with my stairs? Do not leave any items on the stairs. Make sure that there are handrails on both sides of the stairs and use them. Fix handrails that are  broken or loose. Make sure that handrails are as long as the stairways. Check any carpeting to make sure that it is firmly attached to the stairs. Fix any carpet that is loose or worn. Avoid having throw rugs at the top or bottom of the stairs. If you do have throw rugs, attach them to the floor with carpet tape. Make sure that you have a light switch at the top of the stairs and the bottom of the stairs. If you do not have them, ask someone to add them for you. What else can I do to help prevent falls? Wear shoes that: Do not have high heels. Have rubber bottoms. Are comfortable and fit you well. Are closed at the toe. Do not wear sandals. If you use a stepladder: Make sure that it is fully opened. Do not climb a closed stepladder. Make sure that both sides of the stepladder are locked into place. Ask someone to hold it for you, if possible. Clearly mark and make sure that you can  see: Any grab bars or handrails. First and last steps. Where the edge of each step is. Use tools that help you move around (mobility aids) if they are needed. These include: Canes. Walkers. Scooters. Crutches. Turn on the lights when you go into a dark area. Replace any light bulbs as soon as they burn out. Set up your furniture so you have a clear path. Avoid moving your furniture around. If any of your floors are uneven, fix them. If there are any pets around you, be aware of where they are. Review your medicines with your doctor. Some medicines can make you feel dizzy. This can increase your chance of falling. Ask your doctor what other things that you can do to help prevent falls. This information is not intended to replace advice given to you by your health care provider. Make sure you discuss any questions you have with your health care provider. Document Released: 09/04/2009 Document Revised: 04/15/2016 Document Reviewed: 12/13/2014 Elsevier Interactive Patient Education  2017 [employer].

## 2022-09-10 NOTE — Progress Notes (Signed)
Virtual Visit via Telephone Note  I connected with  [name] on 09/10/22 at 10:30 AM EDT by telephone and verified that I am speaking with the correct person using two identifiers.  Location: Patient: Home Provider: [place] Persons participating in the virtual visit: [place]   I discussed the limitations, risks, security and privacy concerns of performing an evaluation and management service by telephone and the availability of in person appointments. The patient expressed understanding and agreed to proceed.  Interactive audio and video telecommunications were attempted between this nurse and patient, however failed, due to patient having technical difficulties OR patient did not have access to video capability.  We continued and completed visit with audio only.  Some vital signs may be absent or patient reported.   Sheral Flow, LPN  Subjective:   [name] is a 81 y.o. female who presents for Medicare Annual (Subsequent) preventive examination.  Review of Systems     Cardiac Risk Factors include: advanced age (>46mn, >>30women);dyslipidemia;hypertension;family history of premature cardiovascular disease     Objective:    There were no vitals filed for this visit. There is no height or weight on file to calculate BMI.     09/10/2022   10:34 AM 09/09/2021   11:57 AM 06/30/2017    1:19 PM 12/01/2015   11:27 AM 04/19/2015    6:35 AM 10/13/2014    9:55 AM  Advanced Directives  Does Patient Have a Medical Advance Directive? Yes Yes Yes Yes No Yes  Type of AParamedicof APerkinsLiving will Living will;Healthcare Power of ASt. XavierLiving will HUpper FruitlandLiving will  HYaakLiving will  Does patient want to make changes to medical advance directive?  No - Patient declined  No - Patient declined  No - Patient declined  Copy of H[place] Wittin Chart? No - copy requested No - copy requested No - copy requested Yes  No - copy requested  Would patient like information on creating a medical advance directive?     No - patient declined information     Current Medications (verified) Outpatient Encounter Medications as of 09/10/2022  Medication Sig   ALPRAZolam (XANAX) 0.25 MG tablet TAKE ONE TABLET BY MOUTH THREE TIMES DAILY AS NEEDED FOR SLEEP OR ANXIETY   aspirin 325 MG EC tablet Take 325 mg by mouth as needed for pain.   Biotin 5000 MCG CAPS Take by mouth.   carvedilol (COREG) 3.125 MG tablet TAKE 1 TABLET BY MOUTH TWICE DAILY WITH A MEAL   Cholecalciferol (VITAMIN D3) 5000 UNITS TABS Take 1 tablet by mouth daily.   Coenzyme Q10 (COQ10) 100 MG CAPS Take by mouth.   Cyanocobalamin (VITAMIN B-12 CR) 1500 MCG TBCR Take 1 tablet by mouth 3 (three) times a week.   LIVALO 2 MG TABS Take 1 tablet by mouth once daily   metroNIDAZOLE (METROGEL) 0.75 % gel Apply 1 application. topically 2 (two) times daily.   NP THYROID 30 MG tablet TAKE 1 TABLET BY MOUTH ONCE DAILY BEFORE BREAKFAST   Omega-3 Fatty Acids (FISH OIL) 1000 MG CAPS Take 2 capsules by mouth daily.   pantoprazole (PROTONIX) 40 MG tablet TAKE 1 TABLET BY MOUTH ONCE DAILY AS NEEDED FOR ACID REFLUX   zinc gluconate 50 MG tablet Take 50 mg by mouth daily.   No facility-administered encounter medications on file as of 09/10/2022.    Allergies (verified) Patient  has no known allergies.   History: 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   Hx of cardiovascular stress test    ETT-Myoview (04/2014):  No ischemia, EF 85%; Normal // Myoview 11/17: EF 92, no scar or ischemia, Low Risk // Nuclear stress test 9/19: EF 84, no ischemia; Low Risk    Hx of echocardiogram    Echo (12/15):  EF 55-60%, no RWMA, normal diast  function, MAC, LA 33 mm   Hyperlipidemia    HYPOTHYROIDISM 04/30/2009   Irritable bowel syndrome 04/30/2009   RAYNAUD'S DISEASE 04/30/2009   Past Surgical History:  Procedure Laterality Date   ABDOMINAL HYSTERECTOMY  1976   COLONOSCOPY     ECTOPIC PREGNANCY SURGERY  1970   foot [place]   UPPER GASTROINTESTINAL ENDOSCOPY     Family History  Problem Relation Age of Onset   Heart disease Father    Heart attack Father 4   Cancer Sister        breast cancer   Diabetes Maternal Grandmother    Heart disease Paternal Grandmother    Heart disease Paternal Grandfather    Migraines Daughter    Diabetes Maternal Aunt    Diabetes Maternal Uncle    Cancer Paternal Aunt        lung cancer   Stroke Cousin    Colon cancer Neg Hx    Social History   Socioeconomic History   Marital status: Widowed    Spouse name: Not on file   Number of children: 2   Years of education: Not on file   Highest education level: Not on file  Occupational History   Occupation: Sales promotion account executive    Employer: RETIRED   Occupation: Retired-worked for [employer] and [employer]   Smoking status: Former    Types: Cigarettes    Quit date: 10/27/1984    Years since quitting: 37.8   Smokeless tobacco: Never   Tobacco comments:    quit 1985  Vaping Use   Vaping Use: Never used  Substance and Sexual Activity   Alcohol use: Yes    Alcohol/week: 0.0 standard drinks of alcohol    Comment: 1-2 per week   Drug use: No   Sexual activity: Not Currently    Birth control/protection: Post-menopausal  Other Topics Concern   Not on file  Social History Narrative   Patients gets regular exercise.   Patient lives alone in a one story home with a basement.  She is a widow.  Has 2 children.  Retired from [employer].  Education: some college.   Social Determinants of Health   Financial Resource Strain: Low Risk  (09/10/2022)   Overall  Financial Resource Strain (CARDIA)    Difficulty of Paying Living Expenses: Not hard at all  Food Insecurity: No Food Insecurity (09/10/2022)   Hunger Vital Sign    Worried About Running Out of Food in the Last Year: Never true    Ran Out of Food in the Last Year: Never true  Transportation Needs: No Transportation Needs (09/10/2022)   PRAPARE - Hydrologist (Medical): No    Lack of Transportation (Non-Medical): No  Physical Activity: Sufficiently Active (09/10/2022)   Exercise Vital Sign    Days of Exercise  per Week: 6 days    Minutes of Exercise per Session: 60 min  Stress: No Stress Concern Present (09/10/2022)   [place]    Feeling of Stress : Not at all  Social Connections: Moderately Isolated (09/10/2022)   Social Connection and Isolation Panel [NHANES]    Frequency of Communication with Friends and Family: Once a week    Frequency of Social Gatherings with Friends and Family: Once a week    Attends Religious Services: More than 4 times per year    Active Member of [employer] or Organizations: Yes    Attends Archivist Meetings: 1 to 4 times per year    Marital Status: Widowed    Tobacco Counseling Counseling given: Not Answered Tobacco comments: quit 1985   Clinical Intake:  Pre-visit preparation completed: Yes  Pain : No/denies pain     Nutritional Risks: None Diabetes: No  How often do you need to have someone help you when you read instructions, pamphlets, or other written materials from your doctor or pharmacy?: 1 - Never What is the last grade level you completed in school?: HSG; some college  Diabetic? no  Interpreter Needed?: No  Information entered by :: Lisette Abu, LPN.   Activities of Daily Living    09/10/2022   10:38 AM 09/06/2022    9:54 AM  In your present state of health, do you have any difficulty performing the following  activities:  Hearing? 0 0  Vision? 0 0  Difficulty concentrating or making decisions? 0 0  Walking or climbing stairs? 0 0  Dressing or bathing? 0 0  Doing errands, shopping? 0 0  Preparing Food and eating ? N N  Using the Toilet? N N  In the past six months, have you accidently leaked urine? N N  Do you have problems with loss of bowel control? N N  Managing your Medications? N N  Managing your Finances? N N  Housekeeping or managing your Housekeeping? N N    Patient Care Team: Janith Lima, MD as PCP - General Angelena Form Annita Brod, MD as PCP - Cardiology (Cardiology) Charlton Haws, [facility] as Pharmacist (Pharmacist) Lavonna Monarch, MD (Inactive) as Consulting Physician (Dermatology) Warden Fillers, MD as Consulting Physician (Ophthalmology)  Indicate any recent Medical Services you may have received from other than [facility] providers in the past year (date may be approximate).     Assessment:   This is a routine wellness examination for [place].  Hearing/Vision screen Hearing Screening - Comments:: Some hearing difficulties; wear hearing aids.   Vision Screening - Comments:: No glasses or readers. Eye exam done by Warden Fillers, MD.   Dietary issues and exercise activities discussed: Current Exercise Habits: Home exercise routine, Type of exercise: walking;treadmill;Other - see comments (gardening), Time (Minutes): 30, Frequency (Times/Week): 7, Weekly Exercise (Minutes/Week): 210, Intensity: Moderate, Exercise limited by: orthopedic condition(s)   Goals Addressed   None   Depression Screen    09/10/2022   10:37 AM 09/09/2021   11:55 AM 06/16/2021   10:31 AM 01/14/2020   10:15 AM 06/06/2019    4:21 PM 09/14/2018    8:00 AM 06/30/2017    1:20 PM  PHQ 2/9 Scores  PHQ - 2 Score 0 0 0 0 0 0 0  PHQ- 9 Score     0 0 0    Fall Risk    09/10/2022   10:35 AM 09/06/2022    9:54 AM 09/09/2021  11:58 AM 07/07/2020    9:15 AM 06/06/2019    4:20 PM  Fall Risk    Falls in the past year? 0 0 0 0 0  Number falls in past yr: 0 0 0 0 0  Injury with Fall? 0 0 0 0 0  Risk for fall due to : No Fall Risks  No Fall Risks No Fall Risks   Follow up Falls prevention discussed  Falls evaluation completed Falls evaluation completed Falls evaluation completed    [place]:  Any stairs in or around the home? Yes  If so, are there any without handrails? No  Home free of loose throw rugs in walkways, pet beds, electrical cords, etc? Yes  Adequate lighting in your home to reduce risk of falls? Yes   ASSISTIVE DEVICES UTILIZED TO PREVENT FALLS:  Life alert? No  Use of a cane, walker or w/c? No  Grab bars in the bathroom? No  Shower chair or bench in shower? Yes  Elevated toilet seat or a handicapped toilet? No   TIMED UP AND GO:  Was the test performed? No . Phone Visit   Cognitive Function:        09/10/2022   10:43 AM  6CIT Screen  What Year? 0 points  What month? 0 points  What time? 0 points  Count back from 20 0 points  Months in reverse 0 points  Repeat phrase 0 points  Total Score 0 points    Immunizations Immunization History  Administered Date(s) Administered   Fluad Quad(high Dose 65+) 08/19/2021   Hep A / Hep B 09/09/2016   Hepatitis A 03/14/2017   Hepatitis B 10/06/2016, 03/14/2017   Influenza Split 09/15/2012   Influenza Whole 08/13/2010, 08/03/2011   Influenza, High Dose Seasonal PF 09/19/2013, 08/30/2014, 07/29/2015, 07/23/2017, 07/19/2019   Influenza-Unspecified 08/26/2016, 07/31/2018, 08/19/2020, 08/19/2022   Moderna SARS-COV2 Booster Vaccination 04/17/2021, 08/19/2021   Moderna Sars-Covid-2 Vaccination 12/04/2019, 01/04/2020, 09/25/2020   Pneumococcal Conjugate-13 09/30/2014   Pneumococcal Polysaccharide-23 08/13/2010, 09/01/2020   Td 04/30/2008, 09/28/2010   Tdap 08/30/2016   Zoster Recombinat (Shingrix) 11/09/2017, 02/20/2018   Zoster, Live 09/15/2012    TDAP status: Up to  date  Flu Vaccine status: Up to date  Pneumococcal vaccine status: Up to date  Covid-19 vaccine status: Completed vaccines  Qualifies for Shingles Vaccine? Yes   Zostavax completed Yes   Shingrix Completed?: Yes  Screening Tests Health Maintenance  Topic Date Due   COVID-19 Vaccine (4 - Moderna series) 10/14/2021   TETANUS/TDAP  08/30/2026   Pneumonia Vaccine 24+ Years old  Completed   INFLUENZA VACCINE  Completed   DEXA SCAN  Completed   Zoster Vaccines- Shingrix  Completed   HPV VACCINES  Aged Out    Health Maintenance  Health Maintenance Due  Topic Date Due   COVID-19 Vaccine (4 - Moderna series) 10/14/2021    Colorectal cancer screening: No longer required.   Mammogram status: Completed 12/11/2021. Repeat every year  Bone Density status: Completed 01/27/2022. Results reflect: Bone density results: OSTEOPENIA. Repeat every 2 years.  Lung Cancer Screening: (Low Dose CT Chest recommended if Age 85-80 years, 30 pack-year currently smoking OR have quit w/in 15years.) does not qualify.   Lung Cancer Screening Referral: no  Additional Screening:  Hepatitis C Screening: does not qualify; Completed no  Vision Screening: Recommended annual ophthalmology exams for early detection of glaucoma and other disorders of the eye. Is the patient up to date with their annual  eye exam?  Yes  Who is the provider or what is the name of the office in which the patient attends annual eye exams? Warden Fillers, MD. If pt is not established with a provider, would they like to be referred to a provider to establish care? No .   Dental Screening: Recommended annual dental exams for proper oral hygiene  Community Resource Referral / Chronic Care Management: CRR required this visit?  No   CCM required this visit?  No      Plan:     I have personally reviewed and noted the following in the patient's chart:   Medical and social history Use of alcohol, tobacco or illicit drugs   Current medications and supplements including opioid prescriptions. Patient is not currently taking opioid prescriptions. Functional ability and status Nutritional status Physical activity Advanced directives List of other physicians Hospitalizations, surgeries, and ER visits in previous 12 months Vitals Screenings to include cognitive, depression, and falls Referrals and appointments  In addition, I have reviewed and discussed with patient certain preventive protocols, quality metrics, and best practice recommendations. A written personalized care plan for preventive services as well as general preventive health recommendations were provided to patient.     Sheral Flow, LPN   [id number]   Nurse Notes: N/A

## 2022-10-25 ENCOUNTER — Other Ambulatory Visit: Payer: Self-pay | Admitting: Internal Medicine

## 2022-10-25 DIAGNOSIS — E039 Hypothyroidism, unspecified: Secondary | ICD-10-CM

## 2022-11-09 ENCOUNTER — Other Ambulatory Visit: Payer: Self-pay | Admitting: Internal Medicine

## 2022-11-09 DIAGNOSIS — E785 Hyperlipidemia, unspecified: Secondary | ICD-10-CM

## 2022-11-23 DIAGNOSIS — H0102A Squamous blepharitis right eye, upper and lower eyelids: Secondary | ICD-10-CM | POA: Diagnosis not present

## 2022-11-23 DIAGNOSIS — H40013 Open angle with borderline findings, low risk, bilateral: Secondary | ICD-10-CM | POA: Diagnosis not present

## 2022-11-23 DIAGNOSIS — H0102B Squamous blepharitis left eye, upper and lower eyelids: Secondary | ICD-10-CM | POA: Diagnosis not present

## 2022-11-23 DIAGNOSIS — Z961 Presence of intraocular lens: Secondary | ICD-10-CM | POA: Diagnosis not present

## 2022-11-23 DIAGNOSIS — H43813 Vitreous degeneration, bilateral: Secondary | ICD-10-CM | POA: Diagnosis not present

## 2022-11-30 ENCOUNTER — Encounter: Payer: Self-pay | Admitting: Internal Medicine

## 2022-11-30 ENCOUNTER — Ambulatory Visit (INDEPENDENT_AMBULATORY_CARE_PROVIDER_SITE_OTHER): Payer: Medicare Other | Admitting: Internal Medicine

## 2022-11-30 VITALS — BP 128/72 | HR 64 | Temp 97.9°F | Resp 16 | Ht 60.0 in | Wt 120.0 lb

## 2022-11-30 DIAGNOSIS — E785 Hyperlipidemia, unspecified: Secondary | ICD-10-CM

## 2022-11-30 DIAGNOSIS — E039 Hypothyroidism, unspecified: Secondary | ICD-10-CM

## 2022-11-30 LAB — HEPATIC FUNCTION PANEL
ALT: 12 U/L (ref 0–35)
AST: 21 U/L (ref 0–37)
Albumin: 4.3 g/dL (ref 3.5–5.2)
Alkaline Phosphatase: 58 U/L (ref 39–117)
Bilirubin, Direct: 0.1 mg/dL (ref 0.0–0.3)
Total Bilirubin: 0.6 mg/dL (ref 0.2–1.2)
Total Protein: 7 g/dL (ref 6.0–8.3)

## 2022-11-30 LAB — LIPID PANEL
Cholesterol: 156 mg/dL (ref 0–200)
HDL: 67.7 mg/dL (ref >39.00)
LDL Cholesterol: 79 mg/dL (ref 0–99)
NonHDL: 88.73
Total CHOL/HDL Ratio: 2
Triglycerides: 49 mg/dL (ref 0.0–149.0)
VLDL: 9.8 mg/dL (ref 0.0–40.0)

## 2022-11-30 NOTE — Progress Notes (Signed)
Subjective:  Patient ID: [name], female    DOB: 1941-05-01  Age: 82 y.o. MRN: [id number]  CC: Hyperlipidemia and Hypothyroidism   HPI [name] presents for f/up -  She walks for an hour on a treadmill every day.  Her endurance is good.  She denies chest pain, shortness of breath, palpitations, dizziness, or lightheadedness.  Outpatient Medications Prior to Visit  Medication Sig Dispense Refill   ALPRAZolam (XANAX) 0.25 MG tablet TAKE ONE TABLET BY MOUTH THREE TIMES DAILY AS NEEDED FOR SLEEP OR ANXIETY 180 tablet 1   Biotin 5000 MCG CAPS Take by mouth.     carvedilol (COREG) 3.125 MG tablet TAKE 1 TABLET BY MOUTH TWICE DAILY WITH A MEAL 180 tablet 0   Cholecalciferol (VITAMIN D3) 5000 UNITS TABS Take 1 tablet by mouth daily.     Coenzyme Q10 (COQ10) 100 MG CAPS Take by mouth.     Cyanocobalamin (VITAMIN B-12 CR) 1500 MCG TBCR Take 1 tablet by mouth 3 (three) times a week.     Omega-3 Fatty Acids (FISH OIL) 1000 MG CAPS Take 2 capsules by mouth daily.     pantoprazole (PROTONIX) 40 MG tablet TAKE 1 TABLET BY MOUTH ONCE DAILY AS NEEDED FOR ACID REFLUX 90 tablet 0   zinc gluconate 50 MG tablet Take 50 mg by mouth daily.     aspirin 325 MG EC tablet Take 325 mg by mouth as needed for pain.     NP THYROID 30 MG tablet TAKE 1 TABLET BY MOUTH ONCE DAILY BEFORE BREAKFAST 90 tablet 0   Pitavastatin Calcium 2 MG TABS Take 1 tablet by mouth once daily 90 tablet 0   metroNIDAZOLE (METROGEL) 0.75 % gel Apply 1 application. topically 2 (two) times daily. 45 g 0   No facility-administered medications prior to visit.    ROS Review of Systems  Constitutional: Negative.  Negative for diaphoresis and fatigue.  HENT: Negative.    Eyes: Negative.   Respiratory:  Negative for cough, chest tightness, shortness of breath and wheezing.   Cardiovascular:  Negative for chest pain, palpitations and leg swelling.  Gastrointestinal:  Negative for abdominal pain, constipation, diarrhea,  nausea and vomiting.  Endocrine: Negative.   Genitourinary: Negative.  Negative for difficulty urinating.  Musculoskeletal:  Negative for arthralgias and myalgias.  Skin: Negative.  Negative for color change and pallor.  Neurological: Negative.  Negative for dizziness and weakness.  Hematological:  Negative for adenopathy. Does not bruise/bleed easily.  Psychiatric/Behavioral: Negative.      Objective:  BP 128/72 (BP Location: Right Arm, Patient Position: Sitting, Cuff Size: Large)   Pulse 64   Temp 97.9 F (36.6 C) (Oral)   Resp 16   Ht 5' (1.524 m)   Wt 120 lb (54.4 kg)   SpO2 90%   BMI 23.44 kg/m   BP Readings from Last 3 Encounters:  11/30/22 128/72  06/16/22 138/72  02/22/22 132/70    Wt Readings from Last 3 Encounters:  11/30/22 120 lb (54.4 kg)  06/16/22 118 lb (53.5 kg)  02/22/22 130 lb 3.2 oz (59.1 kg)    Physical Exam Vitals reviewed.  HENT:     Nose: Nose normal.     Mouth/Throat:     Mouth: Mucous membranes are moist.  Eyes:     General: No scleral icterus.    Conjunctiva/sclera: Conjunctivae normal.  Cardiovascular:     Rate and Rhythm: Normal rate and regular rhythm.     Heart sounds: No murmur  heard. Pulmonary:     Effort: Pulmonary effort is normal.     Breath sounds: No stridor. No wheezing, rhonchi or rales.  Abdominal:     General: Abdomen is flat.     Palpations: There is no mass.     Tenderness: There is no abdominal tenderness. There is no guarding.     Hernia: No hernia is present.  Musculoskeletal:        General: Normal range of motion.     Cervical back: Neck supple.     Right lower leg: No edema.     Left lower leg: No edema.  Lymphadenopathy:     Cervical: No cervical adenopathy.  Skin:    General: Skin is warm and dry.  Neurological:     General: No focal deficit present.     Mental Status: She is alert.  Psychiatric:        Mood and Affect: Mood normal.        Behavior: Behavior normal.     Lab Results  Component  Value Date   WBC 3.4 (L) 06/16/2022   HGB 13.6 06/16/2022   HCT 41.1 06/16/2022   PLT 171.0 06/16/2022   GLUCOSE 78 06/16/2022   CHOL 156 11/30/2022   TRIG 49.0 11/30/2022   HDL 67.70 11/30/2022   LDLDIRECT 130.0 08/03/2011   LDLCALC 79 11/30/2022   ALT 12 11/30/2022   AST 21 11/30/2022   NA 139 06/16/2022   K 4.0 06/16/2022   CL 103 06/16/2022   CREATININE 0.68 06/16/2022   BUN 8 06/16/2022   CO2 31 06/16/2022   TSH 4.39 11/30/2022   HGBA1C 5.5 06/06/2019    DG Thoracic Spine W/Swimmers  Result Date: 08/20/2019 CLINICAL DATA:  Chest pain, no known injury, initial encounter EXAM: THORACIC SPINE - 3 VIEWS COMPARISON:  04/15/2011 FINDINGS: Vertebral body height is well maintained. Multilevel osteophytic changes are seen. No acute compression deformity is noted. No paraspinal mass is seen. IMPRESSION: Degenerative change without acute abnormality. Electronically Signed   By: Inez Catalina M.D.   On: 08/20/2019 14:15   DG Chest 2 View  Result Date: 08/20/2019 CLINICAL DATA:  Thoracic spine pain radiating into the chest for 3 weeks. EXAM: CHEST - 2 VIEW COMPARISON:  PA and lateral chest 04/05/2014. FINDINGS: Lungs are clear. Heart size is normal. No pneumothorax or pleural fluid. No acute or focal bony abnormality. Multilevel anterior endplate spurring appears unchanged. IMPRESSION: No acute disease. 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 Electronically Signed   By: Inge Rise M.D.   On: 08/20/2019 14:07    Assessment & Plan:   [name] was seen today for hyperlipidemia and hypothyroidism.  Diagnoses and all orders for this visit:  Acquired hypothyroidism- She is euthyroid. -     Lipid panel; Future -     Thyroid Panel With TSH; Future -     Thyroid Panel With TSH -     Lipid panel -     thyroid (NP THYROID) 30 MG tablet; Take 1 tablet (30 mg total) by mouth daily before breakfast.  Hyperlipidemia with target LDL less than 130- LDL goal achieved. Doing well on  the statin  -     Lipid panel; Future -     Hepatic function panel; Future -     Hepatic function panel -     Lipid panel -     Pitavastatin Calcium 2 MG TABS; Take 1 tablet (2 mg total) by mouth daily.   I  have discontinued Zena Amos. Arceneaux's aspirin EC and metroNIDAZOLE. I have changed her NP Thyroid to thyroid. I have also changed her Pitavastatin Calcium. Additionally, I am having her maintain her Vitamin B-12 CR, Fish Oil, Vitamin D3, Biotin, CoQ10, zinc gluconate, ALPRAZolam, pantoprazole, and carvedilol.  Meds ordered this encounter  Medications   Pitavastatin Calcium 2 MG TABS    Sig: Take 1 tablet (2 mg total) by mouth daily.    Dispense:  90 tablet    Refill:  1   thyroid (NP THYROID) 30 MG tablet    Sig: Take 1 tablet (30 mg total) by mouth daily before breakfast.    Dispense:  90 tablet    Refill:  1     Follow-up: Return in about 6 months (around 05/31/2023).  Scarlette Calico, MD

## 2022-11-30 NOTE — Patient Instructions (Signed)

## 2022-12-01 LAB — THYROID PANEL WITH TSH
Free Thyroxine Index: 1.8 (ref 1.4–3.8)
T3 Uptake: 25 % (ref 22–35)
T4, Total: 7.2 ug/dL (ref 5.1–11.9)
TSH: 4.39 mIU/L (ref 0.40–4.50)

## 2022-12-04 MED ORDER — THYROID 30 MG PO TABS: 30.0000 mg | ORAL_TABLET | Freq: Every day | ORAL | 1 refills | Status: DC

## 2022-12-04 MED ORDER — PITAVASTATIN CALCIUM 2 MG PO TABS: 1.0000 | ORAL_TABLET | Freq: Every day | ORAL | 1 refills | Status: DC

## 2022-12-17 DIAGNOSIS — Z1231 Encounter for screening mammogram for malignant neoplasm of breast: Secondary | ICD-10-CM | POA: Diagnosis not present

## 2022-12-17 LAB — HM MAMMOGRAPHY

## 2023-01-14 DIAGNOSIS — F419 Anxiety disorder, unspecified: Secondary | ICD-10-CM | POA: Insufficient documentation

## 2023-01-14 DIAGNOSIS — H04123 Dry eye syndrome of bilateral lacrimal glands: Secondary | ICD-10-CM | POA: Insufficient documentation

## 2023-01-14 DIAGNOSIS — Z6824 Body mass index (BMI) 24.0-24.9, adult: Secondary | ICD-10-CM | POA: Diagnosis not present

## 2023-02-03 ENCOUNTER — Telehealth: Payer: Self-pay

## 2023-02-03 NOTE — Telephone Encounter (Signed)
approved through 11/22/2023. 

## 2023-02-03 NOTE — Telephone Encounter (Signed)
Key: Joyce Copa

## 2023-02-23 ENCOUNTER — Telehealth: Payer: Self-pay | Admitting: Internal Medicine

## 2023-02-23 ENCOUNTER — Ambulatory Visit (INDEPENDENT_AMBULATORY_CARE_PROVIDER_SITE_OTHER): Payer: Medicare Other | Admitting: Internal Medicine

## 2023-02-23 ENCOUNTER — Ambulatory Visit (INDEPENDENT_AMBULATORY_CARE_PROVIDER_SITE_OTHER)
Admission: RE | Admit: 2023-02-23 | Discharge: 2023-02-23 | Disposition: A | Discharge status: Home / Self Care | Payer: Medicare Other | Source: Ambulatory Visit | Attending: Internal Medicine | Admitting: Internal Medicine

## 2023-02-23 ENCOUNTER — Other Ambulatory Visit (INDEPENDENT_AMBULATORY_CARE_PROVIDER_SITE_OTHER): Payer: Medicare Other

## 2023-02-23 VITALS — BP 118/72 | HR 70 | Temp 98.0°F | Ht 60.0 in | Wt 122.0 lb

## 2023-02-23 DIAGNOSIS — R0789 Other chest pain: Secondary | ICD-10-CM

## 2023-02-23 DIAGNOSIS — R911 Solitary pulmonary nodule: Secondary | ICD-10-CM

## 2023-02-23 DIAGNOSIS — K219 Gastro-esophageal reflux disease without esophagitis: Secondary | ICD-10-CM | POA: Diagnosis not present

## 2023-02-23 DIAGNOSIS — F410 Panic disorder [episodic paroxysmal anxiety] without agoraphobia: Secondary | ICD-10-CM

## 2023-02-23 DIAGNOSIS — I10 Essential (primary) hypertension: Secondary | ICD-10-CM | POA: Diagnosis not present

## 2023-02-23 DIAGNOSIS — R079 Chest pain, unspecified: Secondary | ICD-10-CM | POA: Diagnosis not present

## 2023-02-23 LAB — CBC WITH DIFFERENTIAL/PLATELET
Basophils Absolute: 0.1 10*3/uL (ref 0.0–0.1)
Basophils Relative: 1.1 % (ref 0.0–3.0)
Eosinophils Absolute: 0.1 10*3/uL (ref 0.0–0.7)
Eosinophils Relative: 1.3 % (ref 0.0–5.0)
HCT: 41.6 % (ref 36.0–46.0)
Hemoglobin: 14.2 g/dL (ref 12.0–15.0)
Lymphocytes Relative: 23 % (ref 12.0–46.0)
Lymphs Abs: 1.1 10*3/uL (ref 0.7–4.0)
MCHC: 34 g/dL (ref 30.0–36.0)
MCV: 91 fl (ref 78.0–100.0)
Monocytes Absolute: 0.3 10*3/uL (ref 0.1–1.0)
Monocytes Relative: 6.1 % (ref 3.0–12.0)
Neutro Abs: 3.3 10*3/uL (ref 1.4–7.7)
Neutrophils Relative %: 68.5 % (ref 43.0–77.0)
Platelets: 208 10*3/uL (ref 150.0–400.0)
RBC: 4.57 Mil/uL (ref 3.87–5.11)
RDW: 14.2 % (ref 11.5–15.5)
WBC: 4.8 10*3/uL (ref 4.0–10.5)

## 2023-02-23 LAB — COMPREHENSIVE METABOLIC PANEL
ALT: 14 U/L (ref 0–35)
AST: 21 U/L (ref 0–37)
Albumin: 4.4 g/dL (ref 3.5–5.2)
Alkaline Phosphatase: 61 U/L (ref 39–117)
BUN: 15 mg/dL (ref 6–23)
CO2: 31 mEq/L (ref 19–32)
Calcium: 9.3 mg/dL (ref 8.4–10.5)
Chloride: 104 mEq/L (ref 96–112)
Creatinine, Ser: 0.76 mg/dL (ref 0.40–1.20)
GFR: 73.64 mL/min (ref >60.00)
Glucose, Bld: 103 mg/dL — ABNORMAL HIGH (ref 70–99)
Potassium: 4.7 mEq/L (ref 3.5–5.1)
Sodium: 138 mEq/L (ref 135–145)
Total Bilirubin: 0.4 mg/dL (ref 0.2–1.2)
Total Protein: 6.7 g/dL (ref 6.0–8.3)

## 2023-02-23 LAB — TROPONIN I (HIGH SENSITIVITY): High Sens Troponin I: 3 ng/L (ref 2–17)

## 2023-02-23 MED ORDER — ALPRAZOLAM 0.25 MG PO TABS: ORAL_TABLET | ORAL | 1 refills | Status: DC

## 2023-02-23 NOTE — Patient Instructions (Addendum)
     An EKG was done.      Blood work was ordered.   Have this done at [facility]    Medications changes include :   none     Return if symptoms worsen or fail to improve.

## 2023-02-23 NOTE — Progress Notes (Signed)
Subjective:    Patient ID: [name], female    DOB: 08-13-1941, 82 y.o.   MRN: [id number]      HPI Kathyria is here for  Chief Complaint  Patient presents with   Chest Pain    Started feeling chest tightness yesterday; Patient has hx of indigestion; Patient feel pressure right between middle of chest.      Woke up yesterday morning with a heavy feeling in her epigastric-lower chest and it has not gone away.  Nothing makes it worse or better.    This feels different than her usual heartburn - it is more of a pressure feeling.     She has indigestion 3-4 times a week.  She usually drinks soda water and that works.  Sometimes she will take the pantoprazole, but this little water works better.  She walks a mile daily and has no SOB, chest pain.  She was on the treadmill today and felt fine.  She has had increased anxiety and wonders if that is it.  She did take an alprazolam and that did help.  Please  Medications and allergies reviewed with patient and updated if appropriate.  Current Outpatient Medications on File Prior to Visit  Medication Sig Dispense Refill   ALPRAZolam (XANAX) 0.25 MG tablet TAKE ONE TABLET BY MOUTH THREE TIMES DAILY AS NEEDED FOR SLEEP OR ANXIETY 180 tablet 1   Biotin 5000 MCG CAPS Take by mouth.     carvedilol (COREG) 3.125 MG tablet TAKE 1 TABLET BY MOUTH TWICE DAILY WITH A MEAL 180 tablet 0   Cholecalciferol (VITAMIN D3) 5000 UNITS TABS Take 1 tablet by mouth daily.     Coenzyme Q10 (COQ10) 100 MG CAPS Take by mouth.     Cyanocobalamin (VITAMIN B-12 CR) 1500 MCG TBCR Take 1 tablet by mouth 3 (three) times a week.     Omega-3 Fatty Acids (FISH OIL) 1000 MG CAPS Take 2 capsules by mouth daily.     pantoprazole (PROTONIX) 40 MG tablet TAKE 1 TABLET BY MOUTH ONCE DAILY AS NEEDED FOR ACID REFLUX 90 tablet 0   Pitavastatin Calcium 2 MG TABS Take 1 tablet (2 mg total) by mouth daily. 90 tablet 1   thyroid (NP THYROID) 30 MG tablet Take 1 tablet (30 mg  total) by mouth daily before breakfast. 90 tablet 1   zinc gluconate 50 MG tablet Take 50 mg by mouth daily.     No current facility-administered medications on file prior to visit.    Review of Systems  Constitutional:  Negative for chills, diaphoresis, fatigue and fever.  Respiratory:  Negative for cough, shortness of breath and wheezing.   Cardiovascular:  Negative for chest pain, palpitations and leg swelling.  Gastrointestinal:  Negative for abdominal pain, constipation, diarrhea and nausea.       GERD  Neurological:  Negative for dizziness, light-headedness and headaches.       Objective:   Vitals:   02/23/23 1302  BP: 118/72  Pulse: 70  Temp: 98 F (36.7 C)  SpO2: 95%   BP Readings from Last 3 Encounters:  02/23/23 118/72  11/30/22 128/72  06/16/22 138/72   Wt Readings from Last 3 Encounters:  02/23/23 122 lb (55.3 kg)  11/30/22 120 lb (54.4 kg)  06/16/22 118 lb (53.5 kg)   Body mass index is 23.83 kg/m.    Physical Exam Constitutional:      General: She is not in acute distress.    Appearance: Normal appearance.  HENT:     Head: Normocephalic and atraumatic.  Eyes:     Conjunctiva/sclera: Conjunctivae normal.  Cardiovascular:     Rate and Rhythm: Normal rate and regular rhythm.     Heart sounds: Normal heart sounds.  Pulmonary:     Effort: Pulmonary effort is normal. No respiratory distress.     Breath sounds: Normal breath sounds. No wheezing.  Chest:     Chest wall: No tenderness.  Abdominal:     General: There is no distension.     Palpations: Abdomen is soft.     Tenderness: There is no abdominal tenderness.  Musculoskeletal:     Cervical back: Neck supple.     Right lower leg: No edema.     Left lower leg: No edema.  Lymphadenopathy:     Cervical: No cervical adenopathy.  Skin:    General: Skin is warm and dry.     Findings: No rash.  Neurological:     Mental Status: She is alert. Mental status is at baseline.  Psychiatric:         Mood and Affect: Mood normal.        Behavior: Behavior normal.       Lab Results  Component Value Date   WBC 4.8 02/23/2023   HGB 14.2 02/23/2023   HCT 41.6 02/23/2023   PLT 208.0 02/23/2023   GLUCOSE 103 (H) 02/23/2023   CHOL 156 11/30/2022   TRIG 49.0 11/30/2022   HDL 67.70 11/30/2022   LDLDIRECT 130.0 08/03/2011   LDLCALC 79 11/30/2022   ALT 14 02/23/2023   AST 21 02/23/2023   NA 138 02/23/2023   K 4.7 02/23/2023   CL 104 02/23/2023   CREATININE 0.76 02/23/2023   BUN 15 02/23/2023   CO2 31 02/23/2023   TSH 4.39 11/30/2022   HGBA1C 5.5 06/06/2019     Lab Results  Component Value Date   CKTOTAL 45 11/26/2020   CKMB 1.2 04/27/2011   TROPONINI <0.30 10/13/2014     DG Chest 2 View CLINICAL DATA:  Chest pain  EXAM: CHEST - 2 VIEW  COMPARISON:  Radiograph 05/22/2020  FINDINGS: Unchanged cardiomediastinal silhouette. There is no focal airspace consolidation. There is a 6 mm round density overlying the left lower lung. No large effusion or evidence of pneumothorax. Thoracic spondylosis. No acute osseous abnormality.  IMPRESSION: No evidence of acute cardiopulmonary disease.  6 mm nodular opacity overlying the left lower lung. This should be further evaluated with non-emergent chest CT, when able.  Electronically Signed   By: Maurine Simmering M.D.   On: 02/23/2023 14:58       Assessment & Plan:     Chest pain/epigastric pain: Acute Woke up with it yesterday morning and has been present since-no alleviating or exacerbating symptoms Pressure-like discomfort-not typical of her GERD Able to exercise without any symptoms and walks daily No associated symptoms such as shortness of breath, diaphoresis, palpitations ?  Cardiac versus GI versus anxiety related EKG here today normal sinus rhythm at 71 bpm, normal EKG.  No change compared to EKG from 02/2022. Deferred ED evaluation Will get CBC, CMP, troponin and chest x-ray CBC, CMP and troponin all  normal Chest x-ray does show 6 mm lung nodule, but no acute pulmonary disease   Anxiety: She is having increased stress and anxiety That could be was causing her symptoms Due for refill of her alprazolam-refilled and she will take as prescribed She will work on her anxiety  GERD: Chronic Intermittent Typically drinks  soda water and that is very effective The symptoms are different than that Advised her to consider taking the pantoprazole on a daily basis for a few days just to make sure this is not reflux  6 mm LLL nodule: Seen on chest x-ray Called with results CT of chest ordered to evaluate further  Hypertension: Chronic Blood pressure is well-controlled Continue carvedilol 3.125 mg twice daily   Advised that if her pain gets worse or she develops any other concerning symptoms she will need to go to the emergency room She will call if her symptoms or not improving or if she has any other concerns or questions

## 2023-02-23 NOTE — Telephone Encounter (Signed)
Please let her know her kidney function, liver test, blood counts and heart muscle enzyme are all normal or negative.  This confirms she is not having a heart attack and has not had a heart attack.   Her chest x-ray shows no acute lung disease.  She does have a 6 mm nodule or marking in her left lower lobe.  This may just be a shadow or from an old infection, but we should get a CT of her lungs to evaluate further which I have ordered.  Someone will call her to schedule this.  If her chest pain/upper stomach pain does not improve she should let us know.

## 2023-02-23 NOTE — Telephone Encounter (Signed)
Spoke with patient today and results given. 

## 2023-03-16 ENCOUNTER — Ambulatory Visit: Payer: Medicare Other | Admitting: Internal Medicine

## 2023-03-29 ENCOUNTER — Ambulatory Visit
Admission: RE | Admit: 2023-03-29 | Discharge: 2023-03-29 | Disposition: A | Discharge status: Home / Self Care | Payer: Medicare Other | Source: Ambulatory Visit | Attending: Internal Medicine | Admitting: Internal Medicine

## 2023-03-29 DIAGNOSIS — I700 Atherosclerosis of aorta: Secondary | ICD-10-CM | POA: Diagnosis not present

## 2023-03-29 DIAGNOSIS — R911 Solitary pulmonary nodule: Secondary | ICD-10-CM | POA: Diagnosis not present

## 2023-03-30 ENCOUNTER — Encounter: Payer: Self-pay | Admitting: Internal Medicine

## 2023-03-30 DIAGNOSIS — L570 Actinic keratosis: Secondary | ICD-10-CM | POA: Diagnosis not present

## 2023-03-30 DIAGNOSIS — L820 Inflamed seborrheic keratosis: Secondary | ICD-10-CM | POA: Diagnosis not present

## 2023-03-30 DIAGNOSIS — D485 Neoplasm of uncertain behavior of skin: Secondary | ICD-10-CM | POA: Diagnosis not present

## 2023-03-30 DIAGNOSIS — D0462 Carcinoma in situ of skin of left upper limb, including shoulder: Secondary | ICD-10-CM | POA: Diagnosis not present

## 2023-03-30 NOTE — Progress Notes (Unsigned)
    Subjective:    Patient ID: [name], female    DOB: [birth year], 82 y.o.   MRN: [id number]      HPI Kamyri is here for No chief complaint on file.    Pain in lower abdomen x 4 days - feels like pressure on bladder and lower intestines  Fever of 100.1 Tuesday    Normal colonoscopy 10/2011  Medications and allergies reviewed with patient and updated if appropriate.  Current Outpatient Medications on File Prior to Visit  Medication Sig Dispense Refill   ALPRAZolam (XANAX) 0.25 MG tablet TAKE ONE TABLET BY MOUTH THREE TIMES DAILY AS NEEDED FOR SLEEP OR ANXIETY 180 tablet 1   Biotin 5000 MCG CAPS Take by mouth.     carvedilol (COREG) 3.125 MG tablet TAKE 1 TABLET BY MOUTH TWICE DAILY WITH A MEAL 180 tablet 0   Cholecalciferol (VITAMIN D3) 5000 UNITS TABS Take 1 tablet by mouth daily.     Coenzyme Q10 (COQ10) 100 MG CAPS Take by mouth.     Cyanocobalamin (VITAMIN B-12 CR) 1500 MCG TBCR Take 1 tablet by mouth 3 (three) times a week.     Omega-3 Fatty Acids (FISH OIL) 1000 MG CAPS Take 2 capsules by mouth daily.     pantoprazole (PROTONIX) 40 MG tablet TAKE 1 TABLET BY MOUTH ONCE DAILY AS NEEDED FOR ACID REFLUX 90 tablet 0   Pitavastatin Calcium 2 MG TABS Take 1 tablet (2 mg total) by mouth daily. 90 tablet 1   thyroid (NP THYROID) 30 MG tablet Take 1 tablet (30 mg total) by mouth daily before breakfast. 90 tablet 1   zinc gluconate 50 MG tablet Take 50 mg by mouth daily.     No current facility-administered medications on file prior to visit.    Review of Systems     Objective:  There were no vitals filed for this visit. BP Readings from Last 3 Encounters:  02/23/23 118/72  11/30/22 128/72  06/16/22 138/72   Wt Readings from Last 3 Encounters:  02/23/23 122 lb (55.3 kg)  11/30/22 120 lb (54.4 kg)  06/16/22 118 lb (53.5 kg)   There is no height or weight on file to calculate BMI.    Physical Exam         Assessment & Plan:    See Problem List for  Assessment and Plan of chronic medical problems.

## 2023-03-30 NOTE — Patient Instructions (Addendum)
      Blood work was ordered.   The lab is on the first floor.    Medications changes include :   none    Ct of your abdomen / pelvis was ordered.      Return if symptoms worsen or fail to improve.

## 2023-03-31 ENCOUNTER — Ambulatory Visit (INDEPENDENT_AMBULATORY_CARE_PROVIDER_SITE_OTHER): Payer: Medicare Other | Admitting: Internal Medicine

## 2023-03-31 ENCOUNTER — Other Ambulatory Visit: Payer: Self-pay

## 2023-03-31 VITALS — BP 114/80 | HR 62 | Temp 98.0°F | Ht 60.0 in | Wt 125.4 lb

## 2023-03-31 DIAGNOSIS — R1030 Lower abdominal pain, unspecified: Secondary | ICD-10-CM | POA: Insufficient documentation

## 2023-03-31 LAB — CBC WITH DIFFERENTIAL/PLATELET
Basophils Absolute: 0 10*3/uL (ref 0.0–0.1)
Basophils Relative: 0.7 % (ref 0.0–3.0)
Eosinophils Absolute: 0.1 10*3/uL (ref 0.0–0.7)
Eosinophils Relative: 2.7 % (ref 0.0–5.0)
HCT: 39.2 % (ref 36.0–46.0)
Hemoglobin: 13 g/dL (ref 12.0–15.0)
Lymphocytes Relative: 42.5 % (ref 12.0–46.0)
Lymphs Abs: 1.5 10*3/uL (ref 0.7–4.0)
MCHC: 33.3 g/dL (ref 30.0–36.0)
MCV: 91.9 fl (ref 78.0–100.0)
Monocytes Absolute: 0.3 10*3/uL (ref 0.1–1.0)
Monocytes Relative: 9.3 % (ref 3.0–12.0)
Neutro Abs: 1.6 10*3/uL (ref 1.4–7.7)
Neutrophils Relative %: 44.8 % (ref 43.0–77.0)
Platelets: 222 10*3/uL (ref 150.0–400.0)
RBC: 4.26 Mil/uL (ref 3.87–5.11)
RDW: 13.9 % (ref 11.5–15.5)
WBC: 3.6 10*3/uL — ABNORMAL LOW (ref 4.0–10.5)

## 2023-03-31 LAB — COMPREHENSIVE METABOLIC PANEL
ALT: 11 U/L (ref 0–35)
AST: 17 U/L (ref 0–37)
Albumin: 3.9 g/dL (ref 3.5–5.2)
Alkaline Phosphatase: 48 U/L (ref 39–117)
BUN: 8 mg/dL (ref 6–23)
CO2: 30 mEq/L (ref 19–32)
Calcium: 9.3 mg/dL (ref 8.4–10.5)
Chloride: 102 mEq/L (ref 96–112)
Creatinine, Ser: 0.72 mg/dL (ref 0.40–1.20)
GFR: 78.52 mL/min (ref >60.00)
Glucose, Bld: 94 mg/dL (ref 70–99)
Potassium: 4.3 mEq/L (ref 3.5–5.1)
Sodium: 138 mEq/L (ref 135–145)
Total Bilirubin: 0.4 mg/dL (ref 0.2–1.2)
Total Protein: 6.7 g/dL (ref 6.0–8.3)

## 2023-03-31 LAB — POC URINALSYSI DIPSTICK (AUTOMATED)
Bilirubin, UA: NEGATIVE
Glucose, UA: NEGATIVE
Ketones, UA: NEGATIVE
Leukocytes, UA: NEGATIVE
Nitrite, UA: NEGATIVE
Protein, UA: NEGATIVE
Spec Grav, UA: 1.01 (ref 1.010–1.025)
Urobilinogen, UA: 0.2 E.U./dL
pH, UA: 6.5 (ref 5.0–8.0)

## 2023-03-31 NOTE — Addendum Note (Signed)
Addended by: Karma Ganja on: 03/31/2023 10:37 AM   Modules accepted: Orders

## 2023-04-01 ENCOUNTER — Telehealth: Payer: Self-pay

## 2023-04-01 NOTE — Telephone Encounter (Signed)
Spoke with patient.  She will keep appointment for Wednesday. She is unable to do it on Tuesday and said Monday would be pushing it.

## 2023-04-02 ENCOUNTER — Other Ambulatory Visit: Payer: Self-pay | Admitting: Internal Medicine

## 2023-04-02 ENCOUNTER — Encounter: Payer: Self-pay | Admitting: Internal Medicine

## 2023-04-02 DIAGNOSIS — R911 Solitary pulmonary nodule: Secondary | ICD-10-CM

## 2023-04-03 LAB — CULTURE, URINE COMPREHENSIVE

## 2023-04-06 ENCOUNTER — Ambulatory Visit
Admission: RE | Admit: 2023-04-06 | Discharge: 2023-04-06 | Disposition: A | Discharge status: Home / Self Care | Payer: Medicare Other | Source: Ambulatory Visit | Attending: Internal Medicine | Admitting: Internal Medicine

## 2023-04-06 DIAGNOSIS — R1030 Lower abdominal pain, unspecified: Secondary | ICD-10-CM

## 2023-04-06 MED ORDER — IOPAMIDOL (ISOVUE-300) INJECTION 61%
100.0000 mL | Freq: Once | INTRAVENOUS | Status: AC | PRN
  Administered 2023-04-06: 100 mL via INTRAVENOUS

## 2023-04-08 NOTE — Addendum Note (Signed)
Addended by: Karma Ganja on: 04/08/2023 03:56 PM   Modules accepted: Orders

## 2023-04-15 ENCOUNTER — Other Ambulatory Visit: Payer: Self-pay | Admitting: Internal Medicine

## 2023-04-15 DIAGNOSIS — R000 Tachycardia, unspecified: Secondary | ICD-10-CM

## 2023-04-27 ENCOUNTER — Ambulatory Visit: Payer: Medicare Other | Admitting: Pulmonary Disease

## 2023-04-27 ENCOUNTER — Encounter: Payer: Self-pay | Admitting: Pulmonary Disease

## 2023-04-27 VITALS — BP 120/70 | HR 85 | Ht 60.0 in | Wt 124.4 lb

## 2023-04-27 DIAGNOSIS — R911 Solitary pulmonary nodule: Secondary | ICD-10-CM

## 2023-04-27 NOTE — Progress Notes (Signed)
Synopsis: Referred in June 2024 for pulmonary nodules by Pincus Sanes, MD  Subjective:   PATIENT ID: [name] GENDER: female DOB: [birth year], MRN: [id number]  Chief Complaint  Patient presents with   Consult    Consult for pulmonary nodules.    This is an 82 year old female, past medical history of gastroesophageal reflux, hyperlipidemia, former smoker quit in 1985.Patient had CT imaging of the chest in May 2024 and was referred for evaluation of lung nodules.  She initially had a chest x-ray that revealed a nodule in the lingula that abutted the major fissure.  CT imaging reveals 7 x 9 x 9 mm lobulated nodule in the posterior portion of the lingula abutting the major fissure.  The nodule itself contains small area of calcification.  Patient has no respiratory complaints today.  She is fixing to go on a 2-week vacation to the [facility] on a bus tour.    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   Hx of cardiovascular stress test    ETT-Myoview (04/2014):  No ischemia, EF 85%; Normal // Myoview 11/17: EF 92, no scar or ischemia, Low Risk // Nuclear stress test 9/19: EF 84, no ischemia; Low Risk    Hx of echocardiogram    Echo (12/15):  EF 55-60%, no RWMA, normal diast function, MAC, LA 33 mm   Hyperlipidemia    HYPOTHYROIDISM 04/30/2009   Irritable bowel syndrome 04/30/2009   RAYNAUD'S DISEASE 04/30/2009     Family History  Problem Relation Age of Onset   Heart disease Father    Heart attack Father 14   Cancer Sister        breast cancer   Diabetes Maternal Grandmother    Heart disease Paternal Grandmother    Heart disease Paternal Grandfather    Migraines Daughter    Diabetes Maternal Aunt    Diabetes Maternal Uncle    Cancer Paternal Aunt        lung cancer   Stroke Cousin    Colon cancer Neg Hx      Past Surgical  History:  Procedure Laterality Date   ABDOMINAL HYSTERECTOMY  1976   COLONOSCOPY     ECTOPIC PREGNANCY SURGERY  1970   foot surgury  1997   RHINOPLASTY  1980   TONSILLECTOMY  1990   UPPER GASTROINTESTINAL ENDOSCOPY      Social History   Socioeconomic History   Marital status: Widowed    Spouse name: Not on file   Number of children: 2   Years of education: Not on file   Highest education level: Some college, no degree  Occupational History   Occupation: Barrister's clerk: RETIRED   Occupation: Retired-worked for [employer]  Tobacco Use   Smoking status: Former    Types: Cigarettes    Quit date: 10/27/1984    Years since quitting: 38.5   Smokeless tobacco: Never   Tobacco comments:    quit 1985  Vaping Use   Vaping Use: Never used  Substance and Sexual Activity   Alcohol use: Yes    Alcohol/week: 0.0 standard drinks of alcohol    Comment: 1-2 per week   Drug use: No   Sexual activity: Not Currently    Birth control/protection: Post-menopausal  Other Topics Concern   Not on file  Social History Narrative   Patients gets regular exercise.   Patient lives alone in a one story home with a basement.  She is a widow.  Has 2 children.  Retired from [employer].  Education: some college.   Social Determinants of Health   Financial Resource Strain: Low Risk  (02/23/2023)   Overall Financial Resource Strain (CARDIA)    Difficulty of Paying Living Expenses: Not hard at all  Food Insecurity: No Food Insecurity (02/23/2023)   Hunger Vital Sign    Worried About Running Out of Food in the Last Year: Never true    Ran Out of Food in the Last Year: Never true  Transportation Needs: No Transportation Needs (02/23/2023)   PRAPARE - Administrator, Civil Service (Medical): No    Lack of Transportation (Non-Medical): No  Physical Activity: Sufficiently Active (02/23/2023)   Exercise Vital Sign    Days of Exercise per Week: 6  days    Minutes of Exercise per Session: 60 min  Stress: No Stress Concern Present (02/23/2023)   [employer] of Occupational Health - Occupational Stress Questionnaire    Feeling of Stress : Only a little  Social Connections: Moderately Integrated (02/23/2023)   Social Connection and Isolation Panel [NHANES]    Frequency of Communication with Friends and Family: Twice a week    Frequency of Social Gatherings with Friends and Family: Once a week    Attends Religious Services: More than 4 times per year    Active Member of [employer] or Organizations: No    Attends Banker Meetings: 1 to 4 times per year    Marital Status: Widowed  Intimate Partner Violence: Not At Risk (09/10/2022)   Humiliation, Afraid, Rape, and Kick questionnaire    Fear of Current or Ex-Partner: No    Emotionally Abused: No    Physically Abused: No    Sexually Abused: No     No Known Allergies   Outpatient Medications Prior to Visit  Medication Sig Dispense Refill   ALPRAZolam (XANAX) 0.25 MG tablet TAKE ONE TABLET BY MOUTH THREE TIMES DAILY AS NEEDED FOR SLEEP OR ANXIETY 180 tablet 1   Biotin 5000 MCG CAPS Take by mouth.     carvedilol (COREG) 3.125 MG tablet TAKE 1 TABLET BY MOUTH TWICE DAILY WITH A MEAL 180 tablet 0   Cholecalciferol (VITAMIN D3) 5000 UNITS TABS Take 1 tablet by mouth daily.     Coenzyme Q10 (COQ10) 100 MG CAPS Take by mouth.     Cyanocobalamin (VITAMIN B-12 CR) 1500 MCG TBCR Take 1 tablet by mouth 3 (three) times a week.     Omega-3 Fatty Acids (FISH OIL) 1000 MG CAPS Take 2 capsules by mouth daily.     pantoprazole (PROTONIX) 40 MG tablet TAKE 1 TABLET BY MOUTH ONCE DAILY AS NEEDED FOR ACID REFLUX 90 tablet 0   Pitavastatin Calcium 2 MG TABS Take 1 tablet (2 mg total) by mouth daily. 90 tablet 1   thyroid (NP THYROID) 30 MG tablet Take 1 tablet (30 mg total) by mouth daily before breakfast. 90 tablet 1   zinc gluconate 50 MG tablet Take 50 mg by mouth daily.     No  facility-administered medications prior to visit.    Review of Systems  Constitutional:  Negative for chills, fever, malaise/fatigue and weight loss.  HENT:  Negative for hearing loss, sore throat and tinnitus.   Eyes:  Negative for blurred vision and double vision.  Respiratory:  Negative  for cough, hemoptysis, sputum production, shortness of breath, wheezing and stridor.   Cardiovascular:  Negative for chest pain, palpitations, orthopnea, leg swelling and PND.  Gastrointestinal:  Negative for abdominal pain, constipation, diarrhea, heartburn, nausea and vomiting.  Genitourinary:  Negative for dysuria, hematuria and urgency.  Musculoskeletal:  Negative for joint pain and myalgias.  Skin:  Negative for itching and rash.  Neurological:  Negative for dizziness, tingling, weakness and headaches.  Endo/Heme/Allergies:  Negative for environmental allergies. Does not bruise/bleed easily.  Psychiatric/Behavioral:  Negative for depression. The patient is not nervous/anxious and does not have insomnia.   All other systems reviewed and are negative.    Objective:  Physical Exam Vitals reviewed.  Constitutional:      General: She is not in acute distress.    Appearance: She is well-developed.  HENT:     Head: Normocephalic and atraumatic.  Eyes:     General: No scleral icterus.    Conjunctiva/sclera: Conjunctivae normal.     Pupils: Pupils are equal, round, and reactive to light.  Neck:     Vascular: No JVD.     Trachea: No tracheal deviation.  Cardiovascular:     Rate and Rhythm: Normal rate and regular rhythm.     Heart sounds: Normal heart sounds. No murmur heard. Pulmonary:     Effort: Pulmonary effort is normal. No tachypnea, accessory muscle usage or respiratory distress.     Breath sounds: No stridor. No wheezing, rhonchi or rales.  Abdominal:     General: There is no distension.     Palpations: Abdomen is soft.     Tenderness: There is no abdominal tenderness.   Musculoskeletal:        General: No tenderness.     Cervical back: Neck supple.  Lymphadenopathy:     Cervical: No cervical adenopathy.  Skin:    General: Skin is warm and dry.     Capillary Refill: Capillary refill takes less than 2 seconds.     Findings: No rash.  Neurological:     Mental Status: She is alert and oriented to person, place, and time.  Psychiatric:        Behavior: Behavior normal.      Vitals:   04/27/23 1423  BP: 120/70  Pulse: 85  SpO2: 97%  Weight: 124 lb 6.4 oz (56.4 kg)  Height: 5' (1.524 m)   97% on RA BMI Readings from Last 3 Encounters:  04/27/23 24.30 kg/m  03/31/23 24.49 kg/m  02/23/23 23.83 kg/m   Wt Readings from Last 3 Encounters:  04/27/23 124 lb 6.4 oz (56.4 kg)  03/31/23 125 lb 6.4 oz (56.9 kg)  02/23/23 122 lb (55.3 kg)     CBC    Component Value Date/Time   WBC 3.6 (L) 03/31/2023 1043   RBC 4.26 03/31/2023 1043   HGB 13.0 03/31/2023 1043   HCT 39.2 03/31/2023 1043   PLT 222.0 03/31/2023 1043   MCV 91.9 03/31/2023 1043   MCH 31.4 04/19/2015 0724   MCHC 33.3 03/31/2023 1043   RDW 13.9 03/31/2023 1043   LYMPHSABS 1.5 03/31/2023 1043   MONOABS 0.3 03/31/2023 1043   EOSABS 0.1 03/31/2023 1043   BASOSABS 0.0 03/31/2023 1043     Chest Imaging: CT chest May 2024: Small calcified nodule in the lingula. The patient's images have been independently reviewed by me.    Pulmonary Functions Testing Results:     No data to display          FeNO:  Pathology:   Echocardiogram:   Heart Catheterization:     Assessment & Plan:     ICD-10-CM   1. Lung nodule  R91.1 CT Chest Wo Contrast      Discussion:  This is a 82 year old female, remote smoker when she was young.  Husband with lung cancer was also relatively nonsmoking and had a few cigarettes in his lifetime.  Patient was found to have a incidental nodule on a chest x-ray which led to CT imaging.  The nodule itself looks benign to me it is lobulated but  it has an eccentric section of calcification which is reassuring.  Plan: Will repeat a noncontrast CT chest in 6 months. Patient is agreeable to this plan since she does not have a history of malignancy herself and would be deemed low risk. RTC with SG, NP after CT chest in 6 months.    Current Outpatient Medications:    ALPRAZolam (XANAX) 0.25 MG tablet, TAKE ONE TABLET BY MOUTH THREE TIMES DAILY AS NEEDED FOR SLEEP OR ANXIETY, Disp: 180 tablet, Rfl: 1   Biotin 5000 MCG CAPS, Take by mouth., Disp: , Rfl:    carvedilol (COREG) 3.125 MG tablet, TAKE 1 TABLET BY MOUTH TWICE DAILY WITH A MEAL, Disp: 180 tablet, Rfl: 0   Cholecalciferol (VITAMIN D3) 5000 UNITS TABS, Take 1 tablet by mouth daily., Disp: , Rfl:    Coenzyme Q10 (COQ10) 100 MG CAPS, Take by mouth., Disp: , Rfl:    Cyanocobalamin (VITAMIN B-12 CR) 1500 MCG TBCR, Take 1 tablet by mouth 3 (three) times a week., Disp: , Rfl:    Omega-3 Fatty Acids (FISH OIL) 1000 MG CAPS, Take 2 capsules by mouth daily., Disp: , Rfl:    pantoprazole (PROTONIX) 40 MG tablet, TAKE 1 TABLET BY MOUTH ONCE DAILY AS NEEDED FOR ACID REFLUX, Disp: 90 tablet, Rfl: 0   Pitavastatin Calcium 2 MG TABS, Take 1 tablet (2 mg total) by mouth daily., Disp: 90 tablet, Rfl: 1   thyroid (NP THYROID) 30 MG tablet, Take 1 tablet (30 mg total) by mouth daily before breakfast., Disp: 90 tablet, Rfl: 1   zinc gluconate 50 MG tablet, Take 50 mg by mouth daily., Disp: , Rfl:    Josephine Igo, DO [facility] 04/27/2023 2:49 PM

## 2023-04-27 NOTE — Patient Instructions (Signed)
Thank you for visiting Dr. Tonia Brooms at [facility]. Today we recommend the following:  Orders Placed This Encounter  Procedures   CT Chest Wo Contrast    Return in about 6 months (around 10/27/2023) for w/ Kandice Robinsons, NP , after CT Chest.    Please do your part to reduce the spread of COVID-19.

## 2023-05-12 ENCOUNTER — Other Ambulatory Visit: Payer: Self-pay | Admitting: Internal Medicine

## 2023-05-12 ENCOUNTER — Encounter: Payer: Self-pay | Admitting: Internal Medicine

## 2023-05-12 DIAGNOSIS — U071 COVID-19: Secondary | ICD-10-CM | POA: Insufficient documentation

## 2023-05-12 MED ORDER — NIRMATRELVIR/RITONAVIR (PAXLOVID)TABLET
3.0000 | ORAL_TABLET | Freq: Two times a day (BID) | ORAL | 0 refills | Status: AC
Start: 2023-05-12 — End: 2023-05-17

## 2023-05-16 DIAGNOSIS — C44629 Squamous cell carcinoma of skin of left upper limb, including shoulder: Secondary | ICD-10-CM | POA: Diagnosis not present

## 2023-05-16 DIAGNOSIS — L820 Inflamed seborrheic keratosis: Secondary | ICD-10-CM | POA: Diagnosis not present

## 2023-05-23 DIAGNOSIS — H40013 Open angle with borderline findings, low risk, bilateral: Secondary | ICD-10-CM | POA: Diagnosis not present

## 2023-05-23 DIAGNOSIS — Z961 Presence of intraocular lens: Secondary | ICD-10-CM | POA: Diagnosis not present

## 2023-05-23 DIAGNOSIS — H0102A Squamous blepharitis right eye, upper and lower eyelids: Secondary | ICD-10-CM | POA: Diagnosis not present

## 2023-05-23 DIAGNOSIS — H43813 Vitreous degeneration, bilateral: Secondary | ICD-10-CM | POA: Diagnosis not present

## 2023-05-23 DIAGNOSIS — H0102B Squamous blepharitis left eye, upper and lower eyelids: Secondary | ICD-10-CM | POA: Diagnosis not present

## 2023-07-09 ENCOUNTER — Other Ambulatory Visit: Payer: Self-pay | Admitting: Internal Medicine

## 2023-07-09 DIAGNOSIS — R000 Tachycardia, unspecified: Secondary | ICD-10-CM

## 2023-07-13 ENCOUNTER — Other Ambulatory Visit: Payer: Self-pay | Admitting: Internal Medicine

## 2023-07-13 DIAGNOSIS — E039 Hypothyroidism, unspecified: Secondary | ICD-10-CM

## 2023-08-05 ENCOUNTER — Other Ambulatory Visit: Payer: Self-pay | Admitting: Internal Medicine

## 2023-08-05 DIAGNOSIS — E785 Hyperlipidemia, unspecified: Secondary | ICD-10-CM

## 2023-10-03 DIAGNOSIS — L821 Other seborrheic keratosis: Secondary | ICD-10-CM | POA: Diagnosis not present

## 2023-10-03 DIAGNOSIS — L570 Actinic keratosis: Secondary | ICD-10-CM | POA: Diagnosis not present

## 2023-10-03 DIAGNOSIS — D229 Melanocytic nevi, unspecified: Secondary | ICD-10-CM | POA: Diagnosis not present

## 2023-10-03 DIAGNOSIS — L578 Other skin changes due to chronic exposure to nonionizing radiation: Secondary | ICD-10-CM | POA: Diagnosis not present

## 2023-10-03 DIAGNOSIS — Z85828 Personal history of other malignant neoplasm of skin: Secondary | ICD-10-CM | POA: Diagnosis not present

## 2023-10-03 DIAGNOSIS — L814 Other melanin hyperpigmentation: Secondary | ICD-10-CM | POA: Diagnosis not present

## 2023-10-04 ENCOUNTER — Other Ambulatory Visit: Payer: Self-pay | Admitting: Internal Medicine

## 2023-10-04 DIAGNOSIS — R000 Tachycardia, unspecified: Secondary | ICD-10-CM

## 2023-10-07 ENCOUNTER — Other Ambulatory Visit: Payer: Self-pay | Admitting: Internal Medicine

## 2023-10-07 DIAGNOSIS — E039 Hypothyroidism, unspecified: Secondary | ICD-10-CM

## 2023-10-27 ENCOUNTER — Ambulatory Visit (HOSPITAL_BASED_OUTPATIENT_CLINIC_OR_DEPARTMENT_OTHER)
Admission: RE | Admit: 2023-10-27 | Discharge: 2023-10-27 | Disposition: A | Discharge status: Home / Self Care | Payer: Medicare Other | Source: Ambulatory Visit | Attending: Pulmonary Disease | Admitting: Pulmonary Disease

## 2023-10-27 DIAGNOSIS — J984 Other disorders of lung: Secondary | ICD-10-CM | POA: Diagnosis not present

## 2023-10-27 DIAGNOSIS — R911 Solitary pulmonary nodule: Secondary | ICD-10-CM | POA: Diagnosis not present

## 2023-10-27 DIAGNOSIS — R918 Other nonspecific abnormal finding of lung field: Secondary | ICD-10-CM | POA: Diagnosis not present

## 2023-11-04 ENCOUNTER — Other Ambulatory Visit: Payer: Self-pay | Admitting: Internal Medicine

## 2023-11-04 DIAGNOSIS — E039 Hypothyroidism, unspecified: Secondary | ICD-10-CM

## 2023-11-07 NOTE — Progress Notes (Signed)
Appt with SG coming up to review. She will need follow up imaging in 6-12 months   Thanks,  BLI  [name] Igo, DO [facility] 11/07/2023 1:42 PM

## 2023-11-22 ENCOUNTER — Encounter: Payer: Self-pay | Admitting: Acute Care

## 2023-11-22 ENCOUNTER — Ambulatory Visit: Payer: Medicare Other | Admitting: Acute Care

## 2023-11-22 VITALS — BP 112/72 | HR 74 | Temp 98.0°F | Ht 60.0 in | Wt 126.0 lb

## 2023-11-22 DIAGNOSIS — R918 Other nonspecific abnormal finding of lung field: Secondary | ICD-10-CM | POA: Diagnosis not present

## 2023-11-22 DIAGNOSIS — Z87891 Personal history of nicotine dependence: Secondary | ICD-10-CM | POA: Diagnosis not present

## 2023-11-22 NOTE — Patient Instructions (Signed)
 It is good to see you today. Your CT scan is stable. This is good news. Dr. Brenna recommends a 6-12 month follow up scan.  I have placed an order, you will get a call to get this scheduled closer to the time.  You will follow up with me after the scan has been completed to review results  You will get a call to get this scheduled.  Call if you need us  before.  Call sooner  for any blood in your mucus, or unexplained weight loss.  Have a happy new year. Please contact office for sooner follow up if symptoms do not improve or worsen or seek emergency care

## 2023-11-22 NOTE — Progress Notes (Signed)
 History of Present Illness [name] is a 82 y.o. female former smoker with past medical history of gastroesophageal reflux, hyperlipidemia, former smoker quit in 1985.Patient had CT imaging of the chest in May 2024 and was referred for evaluation of lung nodules .  Patient was referred in June 2024 for pulmonary nodules by Glade DOROTHA Hope, MD  Synopsis 82 year old female, past medical history of gastroesophageal reflux, hyperlipidemia, former smoker quit in 1985.Patient had CT imaging of the chest in May 2024 and was referred for evaluation of lung nodules. She initially had a chest x-ray that revealed a nodule in the lingula that abutted the major fissure. CT imaging reveals 7 x 9 x 9 mm lobulated nodule in the posterior portion of the lingula abutting the major fissure. The nodule itself contains small area of calcification.  Plan after that visit was for a 41-month follow-up CT chest to reevaluate the area of concern.  CT was due December 2024.     11/22/2023 Patient presents for follow-up of CT chest to reevaluate a 7 x 9 x 9 mm lobulated nodule in the posterior portion of the lingula abutting her major fissure.Patient is here today to review results of most recent CT scan of the chest.  Patient states she has been doing well from a respiratory standpoint.  She denies any hemoptysis or unexplained weight loss.    We have reviewed the results of the CT scan which were personally reviewed by me.  CT scan done on October 27, 2023 shows the previously noted 9 mm pulmonary nodule within the lingula has resolved keeping with infectious or inflammatory etiology.  There is a stable 4 mm noncalcified pulmonary nodule in the left lower lobe.  I have discussed this with the patient and she is in favor of continued imaging to follow this nodule.  Plan is for a follow-up CT in 6 to 12 months.  Patient is in agreement with this plan, and had no further questions at completion of the office today.  We did  go over the patient's imaging today I pulled the scan up on the computer so she could view it, comparing the scan done 6 months ago to the scan done today.  Test Results: CT Chest 10/27/2023 Previously noted 9 mm pulmonary nodule within the lingula has resolved in keeping with an infectious or inflammatory process. Stable 4 mm noncalcified pulmonary nodules within the left lower lobe, likely benign given their stability over time. Mild parenchymal scarring with traction bronchiectasis within the lingula and anterior right upper lobe. No new or suspicious focal pulmonary nodules or infiltrates. No pneumothorax or pleural effusion. No central obstructing mass. Previously noted 9 mm pulmonary nodule within the lingula has resolved in keeping with an infectious or inflammatory process. 2. Stable 4 mm noncalcified pulmonary nodules within the left lower lobe, likely benign given their stability over time. In a patient with risk factors for pulmonary malignancy, follow-up CT examination can be obtained in 6-12 months to confirm a benign etiology.     Latest Ref Rng & Units 03/31/2023   10:43 AM 02/23/2023    2:22 PM 06/16/2022   10:57 AM  CBC  WBC 4.0 - 10.5 K/uL 3.6  4.8  3.4   Hemoglobin 12.0 - 15.0 g/dL 86.9  85.7  86.3   Hematocrit 36.0 - 46.0 % 39.2  41.6  41.1   Platelets 150.0 - 400.0 K/uL 222.0  208.0  171.0        Latest Ref  Rng & Units 03/31/2023   10:43 AM 02/23/2023    2:22 PM 06/16/2022   10:57 AM  BMP  Glucose 70 - 99 mg/dL 94  896  78   BUN 6 - 23 mg/dL 8  15  8    Creatinine 0.40 - 1.20 mg/dL 9.27  9.23  9.31   Sodium 135 - 145 mEq/L 138  138  139   Potassium 3.5 - 5.1 mEq/L 4.3  4.7  4.0   Chloride 96 - 112 mEq/L 102  104  103   CO2 19 - 32 mEq/L 30  31  31    Calcium  8.4 - 10.5 mg/dL 9.3  9.3  9.7     BNP No results found for: BNP  ProBNP    Component Value Date/Time   PROBNP 35.0 04/19/2018 1134    PFT No results found for: FEV1PRE, FEV1POST, FVCPRE,  FVCPOST, TLC, DLCOUNC, PREFEV1FVCRT, PSTFEV1FVCRT  CT Chest Wo Contrast Result Date: 11/07/2023 CLINICAL DATA:  Lung nodule, > 8mm EXAM: CT CHEST WITHOUT CONTRAST TECHNIQUE: Multidetector CT imaging of the chest was performed following the standard protocol without IV contrast. RADIATION DOSE REDUCTION: This exam was performed according to the departmental dose-optimization program which includes automated exposure control, adjustment of the mA and/or kV according to patient size and/or use of iterative reconstruction technique. COMPARISON:  None Available. FINDINGS: Cardiovascular: No significant vascular findings. Normal heart size. No pericardial effusion. Mediastinum/Nodes: No enlarged mediastinal or axillary lymph nodes. Thyroid  gland, trachea, and esophagus demonstrate no significant findings. Lungs/Pleura: Previously noted 9 mm pulmonary nodule within the lingula has resolved in keeping with an infectious or inflammatory process. Stable 4 mm noncalcified pulmonary nodules within the left lower lobe, likely benign given their stability over time. Mild parenchymal scarring with traction bronchiectasis within the lingula and anterior right upper lobe. No new or suspicious focal pulmonary nodules or infiltrates. No pneumothorax or pleural effusion. No central obstructing mass. Upper Abdomen: No acute abnormality. Musculoskeletal: No chest wall mass or suspicious bone lesions identified. IMPRESSION: 1. Previously noted 9 mm pulmonary nodule within the lingula has resolved in keeping with an infectious or inflammatory process. 2. Stable 4 mm noncalcified pulmonary nodules within the left lower lobe, likely benign given their stability over time. In a patient with risk factors for pulmonary malignancy, follow-up CT examination can be obtained in 6-12 months to confirm a benign etiology. Electronically Signed   By: Dorethia Molt M.D.   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   CONSTIPATION 04/30/2009   ESOPHAGEAL STRICTURE 04/30/2009   GERD 04/30/2009   HIATAL HERNIA 04/30/2009   Hx of cardiovascular stress test    ETT-Myoview  (04/2014):  No ischemia, EF 85%; Normal // Myoview  11/17: EF 92, no scar or ischemia, Low Risk // Nuclear stress test 9/19: EF 84, no ischemia; Low Risk    Hx of echocardiogram    Echo (12/15):  EF 55-60%, no RWMA, normal diast function, MAC, LA 33 mm   Hyperlipidemia    HYPOTHYROIDISM 04/30/2009   Irritable bowel syndrome 04/30/2009   RAYNAUD'S DISEASE 04/30/2009     Social History   Tobacco Use   Smoking status: Former    Current packs/day: 0.00    Types: Cigarettes    Quit date: 10/27/1984    Years since quitting: 39.0   Smokeless tobacco: Never   Tobacco comments:    quit 1985  Vaping Use   Vaping status: Never Used  Substance Use Topics   Alcohol use: Yes    Alcohol/week: 0.0 standard drinks of alcohol    Comment: 1-2 per week   Drug use: No    Ms.Baik reports that she quit smoking about 39 years ago. Her smoking use included cigarettes. She has never used smokeless tobacco. She reports current alcohol use. She reports that she does not use drugs.  Tobacco Cessation: Counseling given: Not Answered Tobacco comments: quit 1985   Past surgical hx, Family hx, Social hx all reviewed.  Current Outpatient Medications on File Prior to Visit  Medication Sig   ALPRAZolam  (XANAX ) 0.25 MG tablet TAKE ONE TABLET BY MOUTH THREE TIMES DAILY AS NEEDED FOR SLEEP OR ANXIETY   Biotin 5000 MCG CAPS Take by mouth.   carvedilol  (COREG ) 3.125 MG tablet TAKE 1 TABLET BY MOUTH TWICE DAILY WITH A MEAL   Cholecalciferol (VITAMIN D3) 5000 UNITS TABS Take 1 tablet by mouth daily.   Coenzyme Q10 (COQ10) 100 MG CAPS Take by mouth.   Cyanocobalamin (VITAMIN B-12 CR) 1500 MCG TBCR Take 1 tablet by mouth 3 (three) times a week.   LIVALO  2 MG TABS Take 1 tablet by mouth once daily    Omega-3 Fatty Acids (FISH OIL) 1000 MG CAPS Take 2 capsules by mouth daily.   pantoprazole  (PROTONIX ) 40 MG tablet TAKE 1 TABLET BY MOUTH ONCE DAILY AS NEEDED FOR ACID REFLUX   thyroid  (NP THYROID ) 30 MG tablet TAKE 1 TABLET BY MOUTH ONCE DAILY BEFORE BREAKFAST   zinc gluconate 50 MG tablet Take 50 mg by mouth daily.   No current facility-administered medications on file prior to visit.     No Known Allergies  Review Of Systems:  Constitutional:   No  weight loss, night sweats,  Fevers, chills, fatigue, or  lassitude.  HEENT:   No headaches,  Difficulty swallowing,  Tooth/dental problems, or  Sore throat,                No sneezing, itching, ear ache, nasal congestion, post nasal drip,   CV:  No chest pain,  Orthopnea, PND, swelling in lower extremities, anasarca, dizziness, palpitations, syncope.   GI  No heartburn, indigestion, abdominal pain, nausea, vomiting, diarrhea, change in bowel habits, loss of appetite, bloody stools.   Resp: No shortness of breath with exertion or at rest.  No excess mucus, no productive cough,  No non-productive cough,  No coughing up of blood.  No change in color of mucus.  No wheezing.  No chest wall deformity  Skin: no rash or lesions.  GU: no dysuria, change in color of urine, no urgency or frequency.  No flank pain, no hematuria   MS:  No joint pain or swelling.  No decreased range of motion.  No back pain.  Psych:  No change in mood or affect. No depression or anxiety.  No memory loss.   Vital Signs BP 112/72 (BP Location: Left Arm, Patient Position: Sitting, Cuff Size: Normal)   Pulse 74   Temp 98 F (36.7 C) (Oral)   Ht 5' (1.524 m)   Wt 126 lb (57.2 kg)   SpO2 94%   BMI 24.61 kg/m    Physical Exam:  General- No distress,  A&Ox3, pleasant and appropriate ENT: No sinus tenderness, TM clear, pale nasal mucosa, no oral exudate,no post nasal drip, no LAN Cardiac: S1, S2, regular rate and rhythm, no murmur Chest: No wheeze/ rales/  dullness; no  accessory muscle use, no nasal flaring, no sternal retractions, slightly diminished per bases Abd.: Soft Non-tender, nondistended, bowel sounds positive,Body mass index is 24.61 kg/m.  Ext: No clubbing cyanosis, edema, no obvious deformities Neuro:  normal strength, alert and oriented x 3, moving all extremities x 4, appropriate Skin: No rashes, warm and dry, no obvious lesions Psych: normal mood and behavior   Assessment/Plan Resolution of 9 mm pulmonary nodule in the interim keeping with infectious or inflammatory etiology Stable 4 mm noncalcified pulmonary nodule in the left lower lobe Former smoker quit 1985 Plan Your CT scan is stable. This is good news. Dr. Brenna recommends a 6-12 month follow up scan.  I have placed an order, you will get a call to get this scheduled closer to the time.  You will follow up with me after the scan has been completed to review results  You will get a call to get this scheduled.  Call if you need us  before.  Call sooner  for any blood in your mucus, or unexplained weight loss.  Have a happy new year. Please contact office for sooner follow up if symptoms do not improve or worsen or seek emergency care    I spent 25 minutes dedicated to the care of this patient on the date of this encounter to include pre-visit review of records, face-to-face time with the patient discussing conditions above, post visit ordering of testing, clinical documentation with the electronic health record, making appropriate referrals as documented, and communicating necessary information to the patient's healthcare team.    Lauraine JULIANNA Lites, NP 11/22/2023  1:51 PM

## 2023-11-25 DIAGNOSIS — H0102A Squamous blepharitis right eye, upper and lower eyelids: Secondary | ICD-10-CM | POA: Diagnosis not present

## 2023-11-25 DIAGNOSIS — Z961 Presence of intraocular lens: Secondary | ICD-10-CM | POA: Diagnosis not present

## 2023-11-25 DIAGNOSIS — H43813 Vitreous degeneration, bilateral: Secondary | ICD-10-CM | POA: Diagnosis not present

## 2023-11-25 DIAGNOSIS — H40013 Open angle with borderline findings, low risk, bilateral: Secondary | ICD-10-CM | POA: Diagnosis not present

## 2023-11-25 DIAGNOSIS — H0102B Squamous blepharitis left eye, upper and lower eyelids: Secondary | ICD-10-CM | POA: Diagnosis not present

## 2023-11-29 ENCOUNTER — Other Ambulatory Visit: Payer: Self-pay | Admitting: Internal Medicine

## 2023-11-29 DIAGNOSIS — E039 Hypothyroidism, unspecified: Secondary | ICD-10-CM

## 2023-12-04 NOTE — Patient Instructions (Addendum)
      Blood work was ordered.       Medications changes include :   None     Return in about 6 months (around 06/04/2024) for follow up with Dr Yetta Barre.

## 2023-12-04 NOTE — Progress Notes (Addendum)
 Subjective:    Patient ID: [name], female    DOB: 1941/07/24, 83 y.o.   MRN: [id number]     HPI [name] is here for follow up of her chronic medical problems.  Overall doing well.  Here for follow-up, blood work and refills.  1 week ago she hit her left forehead with the car door when she was opening it.  She had a little soreness on her head on the right side, but minimal soreness where she hit her head.  She does have some bruising which is resolving.  Denies any lightheadedness or dizziness.  No changes in vision  Medications and allergies reviewed with patient and updated if appropriate.  Current Outpatient Medications on File Prior to Visit  Medication Sig Dispense Refill   Biotin 5000 MCG CAPS Take by mouth.     carvedilol  (COREG ) 3.125 MG tablet TAKE 1 TABLET BY MOUTH TWICE DAILY WITH A MEAL 180 tablet 0   Cholecalciferol (VITAMIN D3) 5000 UNITS TABS Take 1 tablet by mouth daily.     Coenzyme Q10 (COQ10) 100 MG CAPS Take by mouth.     Cyanocobalamin (VITAMIN B-12 CR) 1500 MCG TBCR Take 1 tablet by mouth 3 (three) times a week.     LIVALO  2 MG TABS Take 1 tablet by mouth once daily 90 tablet 0   Omega-3 Fatty Acids (FISH OIL) 1000 MG CAPS Take 2 capsules by mouth daily.     pantoprazole  (PROTONIX ) 40 MG tablet TAKE 1 TABLET BY MOUTH ONCE DAILY AS NEEDED FOR ACID REFLUX 90 tablet 0   zinc gluconate 50 MG tablet Take 50 mg by mouth daily.     No current facility-administered medications on file prior to visit.     Review of Systems  Constitutional:  Negative for fever.  HENT:  Positive for voice change (several). Negative for postnasal drip.   Respiratory:  Negative for cough, shortness of breath and wheezing.   Cardiovascular:  Negative for chest pain, palpitations and leg swelling.  Gastrointestinal:        Occ gerd  Neurological:  Negative for light-headedness and headaches.       Objective:   Vitals:   12/06/23 1133  BP: 116/74  Pulse: 60   Temp: 98.3 F (36.8 C)  SpO2: 94%   BP Readings from Last 3 Encounters:  12/06/23 116/74  11/22/23 112/72  04/27/23 120/70   Wt Readings from Last 3 Encounters:  12/06/23 122 lb (55.3 kg)  11/22/23 126 lb (57.2 kg)  04/27/23 124 lb 6.4 oz (56.4 kg)   Body mass index is 23.83 kg/m.    Physical Exam Constitutional:      General: She is not in acute distress.    Appearance: Normal appearance.  HENT:     Head: Normocephalic and atraumatic.  Eyes:     Conjunctiva/sclera: Conjunctivae normal.  Cardiovascular:     Rate and Rhythm: Normal rate and regular rhythm.     Heart sounds: Normal heart sounds.  Pulmonary:     Effort: Pulmonary effort is normal. No respiratory distress.     Breath sounds: Normal breath sounds. No wheezing.  Musculoskeletal:     Cervical back: Neck supple.     Right lower leg: No edema.     Left lower leg: No edema.  Lymphadenopathy:     Cervical: No cervical adenopathy.  Skin:    General: Skin is warm and dry.     Findings: Bruising (Left forehead-1 week  ago hit forehead on the car door-yellow in color and resolving-no swelling) present. No rash.  Neurological:     Mental Status: She is alert. Mental status is at baseline.  Psychiatric:        Mood and Affect: Mood normal.        Behavior: Behavior normal.        Lab Results  Component Value Date   WBC 3.6 (L) 03/31/2023   HGB 13.0 03/31/2023   HCT 39.2 03/31/2023   PLT 222.0 03/31/2023   GLUCOSE 94 03/31/2023   CHOL 156 11/30/2022   TRIG 49.0 11/30/2022   HDL 67.70 11/30/2022   LDLDIRECT 130.0 08/03/2011   LDLCALC 79 11/30/2022   ALT 11 03/31/2023   AST 17 03/31/2023   NA 138 03/31/2023   K 4.3 03/31/2023   CL 102 03/31/2023   CREATININE 0.72 03/31/2023   BUN 8 03/31/2023   CO2 30 03/31/2023   TSH 4.39 11/30/2022   HGBA1C 5.5 06/06/2019     Assessment & Plan:    See Problem List for Assessment and Plan of chronic medical problems.

## 2023-12-06 ENCOUNTER — Ambulatory Visit (INDEPENDENT_AMBULATORY_CARE_PROVIDER_SITE_OTHER): Payer: Medicare Other | Admitting: Internal Medicine

## 2023-12-06 ENCOUNTER — Encounter: Payer: Self-pay | Admitting: Internal Medicine

## 2023-12-06 VITALS — BP 116/74 | HR 60 | Temp 98.3°F | Ht 60.0 in | Wt 122.0 lb

## 2023-12-06 DIAGNOSIS — K219 Gastro-esophageal reflux disease without esophagitis: Secondary | ICD-10-CM

## 2023-12-06 DIAGNOSIS — E785 Hyperlipidemia, unspecified: Secondary | ICD-10-CM

## 2023-12-06 DIAGNOSIS — F410 Panic disorder [episodic paroxysmal anxiety] without agoraphobia: Secondary | ICD-10-CM

## 2023-12-06 DIAGNOSIS — E039 Hypothyroidism, unspecified: Secondary | ICD-10-CM | POA: Diagnosis not present

## 2023-12-06 DIAGNOSIS — I10 Essential (primary) hypertension: Secondary | ICD-10-CM

## 2023-12-06 LAB — CBC WITH DIFFERENTIAL/PLATELET
Basophils Absolute: 0 10*3/uL (ref 0.0–0.1)
Basophils Relative: 0.7 % (ref 0.0–3.0)
Eosinophils Absolute: 0.2 10*3/uL (ref 0.0–0.7)
Eosinophils Relative: 5.8 % — ABNORMAL HIGH (ref 0.0–5.0)
HCT: 42.4 % (ref 36.0–46.0)
Hemoglobin: 14 g/dL (ref 12.0–15.0)
Lymphocytes Relative: 31.4 % (ref 12.0–46.0)
Lymphs Abs: 1.2 10*3/uL (ref 0.7–4.0)
MCHC: 33.1 g/dL (ref 30.0–36.0)
MCV: 93.2 fL (ref 78.0–100.0)
Monocytes Absolute: 0.4 10*3/uL (ref 0.1–1.0)
Monocytes Relative: 9.5 % (ref 3.0–12.0)
Neutro Abs: 2 10*3/uL (ref 1.4–7.7)
Neutrophils Relative %: 52.6 % (ref 43.0–77.0)
Platelets: 200 10*3/uL (ref 150.0–400.0)
RBC: 4.55 Mil/uL (ref 3.87–5.11)
RDW: 14.1 % (ref 11.5–15.5)
WBC: 3.9 10*3/uL — ABNORMAL LOW (ref 4.0–10.5)

## 2023-12-06 LAB — COMPREHENSIVE METABOLIC PANEL
ALT: 15 U/L (ref 0–35)
AST: 24 U/L (ref 0–37)
Albumin: 4.4 g/dL (ref 3.5–5.2)
Alkaline Phosphatase: 54 U/L (ref 39–117)
BUN: 12 mg/dL (ref 6–23)
CO2: 28 meq/L (ref 19–32)
Calcium: 9.6 mg/dL (ref 8.4–10.5)
Chloride: 103 meq/L (ref 96–112)
Creatinine, Ser: 0.7 mg/dL (ref 0.40–1.20)
GFR: 80.83 mL/min (ref >60.00)
Glucose, Bld: 85 mg/dL (ref 70–99)
Potassium: 4.5 meq/L (ref 3.5–5.1)
Sodium: 139 meq/L (ref 135–145)
Total Bilirubin: 0.5 mg/dL (ref 0.2–1.2)
Total Protein: 6.9 g/dL (ref 6.0–8.3)

## 2023-12-06 LAB — LIPID PANEL
Cholesterol: 164 mg/dL (ref 0–200)
HDL: 69.5 mg/dL (ref >39.00)
LDL Cholesterol: 81 mg/dL (ref 0–99)
NonHDL: 94.24
Total CHOL/HDL Ratio: 2
Triglycerides: 66 mg/dL (ref 0.0–149.0)
VLDL: 13.2 mg/dL (ref 0.0–40.0)

## 2023-12-06 LAB — T4, FREE: Free T4: 1.09 ng/dL (ref 0.60–1.60)

## 2023-12-06 LAB — T3, FREE: T3, Free: 4 pg/mL (ref 2.3–4.2)

## 2023-12-06 LAB — TSH: TSH: 2.14 u[IU]/mL (ref 0.35–5.50)

## 2023-12-06 MED ORDER — ALPRAZOLAM 0.25 MG PO TABS
ORAL_TABLET | ORAL | 1 refills | Status: AC
Start: 2023-12-06 — End: ?

## 2023-12-06 MED ORDER — THYROID 30 MG PO TABS: 30.0000 mg | ORAL_TABLET | Freq: Every day | ORAL | 1 refills | Status: DC

## 2023-12-06 NOTE — Assessment & Plan Note (Signed)
 Chronic Controlled, stable Continue alprazolam 0.25 mg tid prn

## 2023-12-06 NOTE — Assessment & Plan Note (Addendum)
 Chronic GERD controlled-typically only has GERD when she has something acidic Continue protonix 40 mg daily prn

## 2023-12-06 NOTE — Assessment & Plan Note (Addendum)
 Chronic  Clinically euthyroid Check TFTs and will titrate med dose if needed Currently taking NP thyroid 30 mg daily

## 2023-12-06 NOTE — Assessment & Plan Note (Signed)
 Chronic BP well controlled Continue coreg bid  Cmp, cbc

## 2023-12-06 NOTE — Assessment & Plan Note (Signed)
 Chronic Check lipid panel  Continue vivalo 2 mg daily Regular exercise and healthy diet encouraged

## 2024-01-23 DIAGNOSIS — Z1231 Encounter for screening mammogram for malignant neoplasm of breast: Secondary | ICD-10-CM | POA: Diagnosis not present

## 2024-01-23 LAB — HM MAMMOGRAPHY

## 2024-01-24 ENCOUNTER — Encounter: Payer: Self-pay | Admitting: Internal Medicine

## 2024-01-26 ENCOUNTER — Other Ambulatory Visit: Payer: Self-pay | Admitting: Internal Medicine

## 2024-01-26 DIAGNOSIS — E785 Hyperlipidemia, unspecified: Secondary | ICD-10-CM

## 2024-01-27 ENCOUNTER — Ambulatory Visit: Payer: Medicare Other

## 2024-01-27 VITALS — Ht 60.0 in | Wt 125.0 lb

## 2024-01-27 DIAGNOSIS — Z0000 Encounter for general adult medical examination without abnormal findings: Secondary | ICD-10-CM | POA: Diagnosis not present

## 2024-01-27 NOTE — Progress Notes (Signed)
 Subjective:   [name] is a 83 y.o. who presents for a Medicare Wellness preventive visit.  Visit Complete: Virtual I connected with  [name] on 01/27/24 by a audio enabled telemedicine application and verified that I am speaking with the correct person using two identifiers.  Patient Location: Home  Provider Location: Home Office  I discussed the limitations of evaluation and management by telemedicine. The patient expressed understanding and agreed to proceed.  Vital Signs: Because this visit was a virtual/telehealth visit, some criteria may be missing or patient reported. Any vitals not documented were not able to be obtained and vitals that have been documented are patient reported.  VideoDeclined- This patient declined Librarian, academic. Therefore the visit was completed with audio only.  AWV Questionnaire: No: Patient Medicare AWV questionnaire was not completed prior to this visit.  Cardiac Risk Factors include: advanced age (>25men, >44 women);dyslipidemia     Objective:    Today's Vitals   01/27/24 1131  Weight: 125 lb (56.7 kg)  Height: 5' (1.524 m)   Body mass index is 24.41 kg/m.     01/27/2024   11:36 AM 09/10/2022   10:34 AM 09/09/2021   11:57 AM 06/30/2017    1:19 PM 12/01/2015   11:27 AM 04/19/2015    6:35 AM 10/13/2014    9:55 AM  Advanced Directives  Does Patient Have a Medical Advance Directive? Yes Yes Yes Yes Yes No Yes  Type of Estate agent of [place];Living will Healthcare Power of [place];Living will Living will;Healthcare Power of [employer] Power of [place];Living will Healthcare Power of [place];Living will  Healthcare Power of [place];Living will  Does patient want to make changes to medical advance directive?   No - Patient declined  No - Patient declined  No - Patient declined  Copy of Healthcare Power of Attorney in Chart? No - copy requested No - copy requested No  - copy requested No - copy requested Yes  No - copy requested  Would patient like information on creating a medical advance directive?      No - patient declined information     Current Medications (verified) Outpatient Encounter Medications as of 01/27/2024  Medication Sig   ALPRAZolam (XANAX) 0.25 MG tablet TAKE ONE TABLET BY MOUTH THREE TIMES DAILY AS NEEDED FOR SLEEP OR ANXIETY   Biotin 5000 MCG CAPS Take by mouth.   carvedilol (COREG) 3.125 MG tablet TAKE 1 TABLET BY MOUTH TWICE DAILY WITH A MEAL   Cholecalciferol (VITAMIN D3) 5000 UNITS TABS Take 1 tablet by mouth daily.   Coenzyme Q10 (COQ10) 100 MG CAPS Take by mouth.   Cyanocobalamin (VITAMIN B-12 CR) 1500 MCG TBCR Take 1 tablet by mouth 3 (three) times a week.   LIVALO 2 MG TABS Take 1 tablet by mouth once daily   Omega-3 Fatty Acids (FISH OIL) 1000 MG CAPS Take 2 capsules by mouth daily.   pantoprazole (PROTONIX) 40 MG tablet TAKE 1 TABLET BY MOUTH ONCE DAILY AS NEEDED FOR ACID REFLUX   thyroid (NP THYROID) 30 MG tablet Take 1 tablet (30 mg total) by mouth daily before breakfast.   zinc gluconate 50 MG tablet Take 50 mg by mouth daily.   No facility-administered encounter medications on file as of 01/27/2024.    Allergies (verified) Patient has no known allergies.   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/2009   Hx of cardiovascular stress test    ETT-Myoview (04/2014):  No ischemia, EF 85%; Normal // Myoview 11/17: EF 92, no scar or ischemia, Low Risk // Nuclear stress test 9/19: EF 84, no ischemia; Low Risk    Hx of echocardiogram    Echo (12/15):  EF 55-60%, no RWMA, normal diast function, MAC, LA 33 mm   Hyperlipidemia    HYPOTHYROIDISM 04/30/2009   Irritable bowel syndrome 04/30/2009   RAYNAUD'S DISEASE 04/30/2009   Past Surgical History:  Procedure Laterality Date    ABDOMINAL HYSTERECTOMY  1976   COLONOSCOPY     ECTOPIC PREGNANCY SURGERY  1970   foot surgury  1997   RHINOPLASTY  1980   TONSILLECTOMY  1990   UPPER GASTROINTESTINAL ENDOSCOPY     Family History  Problem Relation Age of Onset   Heart disease Father    Heart attack Father 29   Cancer Sister        breast cancer   Diabetes Maternal Grandmother    Heart disease Paternal Grandmother    Heart disease Paternal Grandfather    Migraines Daughter    Diabetes Maternal Aunt    Diabetes Maternal Uncle    Cancer Paternal Aunt        lung cancer   Stroke Cousin    Colon cancer Neg Hx    Social History   Socioeconomic History   Marital status: Widowed    Spouse name: Not on file   Number of children: 2   Years of education: Not on file   Highest education level: Some college, no degree  Occupational History   Occupation: Barrister's clerk: RETIRED   Occupation: Retired-worked for [employer]  Tobacco Use   Smoking status: Former    Current packs/day: 0.00    Types: Cigarettes    Quit date: 10/27/1984    Years since quitting: 39.2   Smokeless tobacco: Never   Tobacco comments:    quit 1985  Vaping Use   Vaping status: Never Used  Substance and Sexual Activity   Alcohol use: Yes    Alcohol/week: 0.0 standard drinks of alcohol    Comment: 1-2 per week   Drug use: No   Sexual activity: Not Currently    Birth control/protection: Post-menopausal  Other Topics Concern   Not on file  Social History Narrative   Patients gets regular exercise.   Patient lives alone in a one story home with a basement.  She is a widow.  Has 2 children.  Retired from [employer].  Education: some college.   Social Drivers of Corporate investment banker Strain: Low Risk  (01/27/2024)   Overall Financial Resource Strain (CARDIA)    Difficulty of Paying Living Expenses: Not hard at all  Food Insecurity: No Food Insecurity (01/27/2024)   Hunger Vital  Sign    Worried About Running Out of Food in the Last Year: Never true    Ran Out of Food in the Last Year: Never true  Transportation Needs: No Transportation Needs (01/27/2024)   PRAPARE - Administrator, Civil Service (Medical): No    Lack of Transportation (Non-Medical): No  Physical Activity: Sufficiently Active (01/27/2024)   Exercise Vital Sign    Days of Exercise per Week: 6 days    Minutes of Exercise per Session: 40 min  Stress: No Stress Concern Present (01/27/2024)  [employer] of Occupational Health - Occupational Stress Questionnaire    Feeling of Stress : Not at all  Social Connections: Moderately Isolated (01/27/2024)   Social Connection and Isolation Panel [NHANES]    Frequency of Communication with Friends and Family: Once a week    Frequency of Social Gatherings with Friends and Family: Once a week    Attends Religious Services: More than 4 times per year    Active Member of [employer] or Organizations: Yes    Attends Banker Meetings: Never    Marital Status: Widowed    Tobacco Counseling Counseling given: Not Answered Tobacco comments: quit 1985    Clinical Intake:  Pre-visit preparation completed: Yes  Pain : No/denies pain     BMI - recorded: 24.41 Nutritional Status: BMI of 19-24  Normal Nutritional Risks: None Diabetes: No  How often do you need to have someone help you when you read instructions, pamphlets, or other written materials from your doctor or pharmacy?: 1 - Never  Interpreter Needed?: No  Information entered by :: Lynkin Saini, RMA   Activities of Daily Living     01/27/2024   11:32 AM  In your present state of health, do you have any difficulty performing the following activities:  Hearing? 1  Comment wears hearing aides  Vision? 0  Difficulty concentrating or making decisions? 0  Walking or climbing stairs? 0  Dressing or bathing? 0  Doing errands, shopping? 0  Preparing Food and eating ? N  Using  the Toilet? N  In the past six months, have you accidently leaked urine? N  Do you have problems with loss of bowel control? N  Managing your Medications? N  Managing your Finances? N  Housekeeping or managing your Housekeeping? N    Patient Care Team: [name], MD as PCP - General [place] James Nile Dear, MD as PCP - Cardiology (Cardiology) Kathyrn Sheriff, [facility] (Inactive) as Pharmacist (Pharmacist) Janalyn Harder, MD (Inactive) as Consulting Physician (Dermatology) Sallye Lat, MD as Consulting Physician (Ophthalmology)  Indicate any recent Medical Services you may have received from other than [facility] providers in the past year (date may be approximate).     Assessment:   This is a routine wellness examination for [place].  Hearing/Vision screen Hearing Screening - Comments:: Wears hearing aides Vision Screening - Comments:: Denies vision issues.    Goals Addressed             This Visit's Progress    Maintain current health status   On track    Continue to travel, enjoy life, family, friends, church.       Depression Screen     01/27/2024   11:41 AM 03/31/2023    9:59 AM 02/23/2023    1:10 PM 09/10/2022   10:37 AM 09/09/2021   11:55 AM 06/16/2021   10:31 AM 01/14/2020   10:15 AM  PHQ 2/9 Scores  PHQ - 2 Score 0 0 0 0 0 0 0  PHQ- 9 Score 0 0         Fall Risk     01/27/2024   11:36 AM 03/31/2023    9:58 AM 02/23/2023    1:10 PM 09/10/2022   10:35 AM 09/06/2022    9:54 AM  Fall Risk   Falls in the past year? 0 0 0 0 0  Number falls in past yr: 0 0 0 0 0  Injury with Fall? 0 0 0 0 0  Risk for fall due to :  No Fall Risks No Fall Risks No Fall Risks No Fall Risks   Follow up Falls prevention discussed;Falls evaluation completed Falls evaluation completed Falls evaluation completed Falls prevention discussed     MEDICARE RISK AT HOME:  Medicare Risk at Home Any stairs in or around the home?: Yes (basement) If so, are there any without handrails?:  Yes Home free of loose throw rugs in walkways, pet beds, electrical cords, etc?: Yes Adequate lighting in your home to reduce risk of falls?: Yes Life alert?: No Use of a cane, walker or w/c?: No Grab bars in the bathroom?: No Shower chair or bench in shower?: No Elevated toilet seat or a handicapped toilet?: No  TIMED UP AND GO:  Was the test performed?  No  Cognitive Function: 6CIT completed        01/27/2024   11:37 AM 09/10/2022   10:43 AM  6CIT Screen  What Year? 0 points 0 points  What month? 0 points 0 points  What time? 0 points 0 points  Count back from 20 0 points 0 points  Months in reverse 0 points 0 points  Repeat phrase 0 points 0 points  Total Score 0 points 0 points    Immunizations Immunization History  Administered Date(s) Administered   Fluad Quad(high Dose 65+) 08/19/2021   Hep A / Hep B 09/09/2016   Hepatitis A 03/14/2017   Hepatitis B 10/06/2016, 03/14/2017   Influenza Split 09/15/2012   Influenza Whole 08/13/2010, 08/03/2011   Influenza, High Dose Seasonal PF 09/19/2013, 08/30/2014, 07/29/2015, 07/23/2017, 07/19/2019   Influenza-Unspecified 08/26/2016, 07/31/2018, 08/19/2020, 08/04/2022, 07/26/2023   Moderna Covid-19 Vaccine Bivalent Booster 17yrs & up 08/19/2021, 05/29/2022   Moderna SARS-COV2 Booster Vaccination 04/17/2021, 08/19/2021   Moderna Sars-Covid-2 Vaccination 12/04/2019, 01/04/2020, 09/25/2020, 04/17/2021   Pneumococcal Conjugate-13 09/30/2014   Pneumococcal Polysaccharide-23 08/13/2010, 09/01/2020   RSV,unspecified 10/19/2022   Td 04/30/2008, 09/28/2010   Tdap 08/30/2016   Zoster Recombinant(Shingrix) 11/09/2017, 02/20/2018   Zoster, Live 09/15/2012    Screening Tests Health Maintenance  Topic Date Due   COVID-19 Vaccine (7 - 2024-25 season) 07/24/2023   Medicare Annual Wellness (AWV)  01/26/2025   DTaP/Tdap/Td (4 - Td or Tdap) 08/30/2026   Pneumonia Vaccine 38+ Years old  Completed   INFLUENZA VACCINE  Completed   DEXA  SCAN  Completed   Zoster Vaccines- Shingrix  Completed   HPV VACCINES  Aged Out   Hepatitis C Screening  Discontinued    Health Maintenance  Health Maintenance Due  Topic Date Due   COVID-19 Vaccine (7 - 2024-25 season) 07/24/2023   Health Maintenance Items Addressed: See Nurse Notes  Additional Screening:  Vision Screening: Recommended annual ophthalmology exams for early detection of glaucoma and other disorders of the eye.  Dental Screening: Recommended annual dental exams for proper oral hygiene  Community Resource Referral / Chronic Care Management: CRR required this visit?  No   CCM required this visit?  No     Plan:     I have personally reviewed and noted the following in the patient's chart:   Medical and social history Use of alcohol, tobacco or illicit drugs  Current medications and supplements including opioid prescriptions. Patient is not currently taking opioid prescriptions. Functional ability and status Nutritional status Physical activity Advanced directives List of other physicians Hospitalizations, surgeries, and ER visits in previous 12 months Vitals Screenings to include cognitive, depression, and falls Referrals and appointments  In addition, I have reviewed and discussed with patient certain preventive protocols, quality  metrics, and best practice recommendations. A written personalized care plan for preventive services as well as general preventive health recommendations were provided to patient.     Marina Desire L Madison Albea, CMA   01/27/2024   After Visit Summary: (MyChart) Due to this being a telephonic visit, the after visit summary with patients personalized plan was offered to patient via MyChart   Notes: Please refer to Routing Comments.

## 2024-01-27 NOTE — Patient Instructions (Signed)
 [name] , Thank you for taking time to come for your Medicare Wellness Visit. I appreciate your ongoing commitment to your health goals. Please review the following plan we discussed and let me know if I can assist you in the future.   Referrals/Orders/Follow-Ups/Clinician Recommendations: It was nice talking to you today.  You are due for a bone density screening.  Remember to discuss with PCP at your next office visit.  Keep up the good work.    This is a list of the screening recommended for you and due dates:  Health Maintenance  Topic Date Due   COVID-19 Vaccine (7 - 2024-25 season) 07/24/2023   Medicare Annual Wellness Visit  01/26/2025   DTaP/Tdap/Td vaccine (4 - Td or Tdap) 08/30/2026   Pneumonia Vaccine  Completed   Flu Shot  Completed   DEXA scan (bone density measurement)  Completed   Zoster (Shingles) Vaccine  Completed   HPV Vaccine  Aged Out   Hepatitis C Screening  Discontinued    Advanced directives: (Copy Requested) Please bring a copy of your health care power of attorney and living will to the office to be added to your chart at your convenience. You can mail to [facility] [address] [place], [place] [address] or email to [email or web address]  Next Medicare Annual Wellness Visit scheduled for next year: Yes

## 2024-01-31 DIAGNOSIS — M8580 Other specified disorders of bone density and structure, unspecified site: Secondary | ICD-10-CM | POA: Diagnosis not present

## 2024-02-22 DIAGNOSIS — R29890 Loss of height: Secondary | ICD-10-CM | POA: Diagnosis not present

## 2024-02-22 DIAGNOSIS — M25562 Pain in left knee: Secondary | ICD-10-CM | POA: Diagnosis not present

## 2024-02-22 DIAGNOSIS — M25561 Pain in right knee: Secondary | ICD-10-CM | POA: Diagnosis not present

## 2024-02-22 DIAGNOSIS — M8588 Other specified disorders of bone density and structure, other site: Secondary | ICD-10-CM | POA: Diagnosis not present

## 2024-04-13 DIAGNOSIS — H903 Sensorineural hearing loss, bilateral: Secondary | ICD-10-CM | POA: Diagnosis not present

## 2024-04-13 DIAGNOSIS — H9313 Tinnitus, bilateral: Secondary | ICD-10-CM | POA: Diagnosis not present

## 2024-04-14 ENCOUNTER — Other Ambulatory Visit: Payer: Self-pay | Admitting: Internal Medicine

## 2024-04-14 DIAGNOSIS — R000 Tachycardia, unspecified: Secondary | ICD-10-CM

## 2024-04-17 DIAGNOSIS — H903 Sensorineural hearing loss, bilateral: Secondary | ICD-10-CM | POA: Diagnosis not present

## 2024-04-20 ENCOUNTER — Other Ambulatory Visit: Payer: Self-pay | Admitting: Internal Medicine

## 2024-04-20 DIAGNOSIS — R000 Tachycardia, unspecified: Secondary | ICD-10-CM

## 2024-04-24 ENCOUNTER — Other Ambulatory Visit: Payer: Self-pay | Admitting: Internal Medicine

## 2024-04-24 DIAGNOSIS — E785 Hyperlipidemia, unspecified: Secondary | ICD-10-CM

## 2024-04-27 ENCOUNTER — Ambulatory Visit

## 2024-05-18 ENCOUNTER — Ambulatory Visit (HOSPITAL_BASED_OUTPATIENT_CLINIC_OR_DEPARTMENT_OTHER)
Admission: RE | Admit: 2024-05-18 | Discharge: 2024-05-18 | Disposition: A | Discharge status: Home / Self Care | Source: Ambulatory Visit | Attending: Acute Care | Admitting: Acute Care

## 2024-05-18 DIAGNOSIS — I700 Atherosclerosis of aorta: Secondary | ICD-10-CM | POA: Diagnosis not present

## 2024-05-18 DIAGNOSIS — R918 Other nonspecific abnormal finding of lung field: Secondary | ICD-10-CM | POA: Insufficient documentation

## 2024-05-19 ENCOUNTER — Other Ambulatory Visit: Payer: Self-pay | Admitting: Internal Medicine

## 2024-05-19 DIAGNOSIS — R000 Tachycardia, unspecified: Secondary | ICD-10-CM

## 2024-05-22 ENCOUNTER — Other Ambulatory Visit: Payer: Self-pay | Admitting: Internal Medicine

## 2024-05-22 DIAGNOSIS — E039 Hypothyroidism, unspecified: Secondary | ICD-10-CM

## 2024-06-07 ENCOUNTER — Ambulatory Visit: Admitting: Internal Medicine

## 2024-06-07 ENCOUNTER — Ambulatory Visit: Payer: Self-pay | Admitting: Internal Medicine

## 2024-06-07 ENCOUNTER — Encounter: Payer: Self-pay | Admitting: Internal Medicine

## 2024-06-07 VITALS — BP 142/78 | HR 60 | Temp 98.2°F | Resp 16 | Ht 60.0 in | Wt 124.4 lb

## 2024-06-07 DIAGNOSIS — E039 Hypothyroidism, unspecified: Secondary | ICD-10-CM | POA: Diagnosis not present

## 2024-06-07 DIAGNOSIS — Z85828 Personal history of other malignant neoplasm of skin: Secondary | ICD-10-CM | POA: Insufficient documentation

## 2024-06-07 DIAGNOSIS — C44629 Squamous cell carcinoma of skin of left upper limb, including shoulder: Secondary | ICD-10-CM | POA: Insufficient documentation

## 2024-06-07 DIAGNOSIS — I10 Essential (primary) hypertension: Secondary | ICD-10-CM

## 2024-06-07 DIAGNOSIS — R000 Tachycardia, unspecified: Secondary | ICD-10-CM | POA: Diagnosis not present

## 2024-06-07 LAB — CBC WITH DIFFERENTIAL/PLATELET
Basophils Absolute: 0 K/uL (ref 0.0–0.1)
Basophils Relative: 0.9 % (ref 0.0–3.0)
Eosinophils Absolute: 0.1 K/uL (ref 0.0–0.7)
Eosinophils Relative: 2.6 % (ref 0.0–5.0)
HCT: 42.4 % (ref 36.0–46.0)
Hemoglobin: 14.1 g/dL (ref 12.0–15.0)
Lymphocytes Relative: 33.8 % (ref 12.0–46.0)
Lymphs Abs: 1.4 K/uL (ref 0.7–4.0)
MCHC: 33.3 g/dL (ref 30.0–36.0)
MCV: 91.5 fl (ref 78.0–100.0)
Monocytes Absolute: 0.4 K/uL (ref 0.1–1.0)
Monocytes Relative: 8.7 % (ref 3.0–12.0)
Neutro Abs: 2.2 K/uL (ref 1.4–7.7)
Neutrophils Relative %: 54 % (ref 43.0–77.0)
Platelets: 177 K/uL (ref 150.0–400.0)
RBC: 4.63 Mil/uL (ref 3.87–5.11)
RDW: 13.9 % (ref 11.5–15.5)
WBC: 4.1 K/uL (ref 4.0–10.5)

## 2024-06-07 LAB — TSH: TSH: 3.88 u[IU]/mL (ref 0.35–5.50)

## 2024-06-07 LAB — BASIC METABOLIC PANEL WITH GFR
BUN: 13 mg/dL (ref 6–23)
CO2: 30 meq/L (ref 19–32)
Calcium: 9.5 mg/dL (ref 8.4–10.5)
Chloride: 102 meq/L (ref 96–112)
Creatinine, Ser: 0.7 mg/dL (ref 0.40–1.20)
GFR: 80.55 mL/min (ref >60.00)
Glucose, Bld: 88 mg/dL (ref 70–99)
Potassium: 4.2 meq/L (ref 3.5–5.1)
Sodium: 139 meq/L (ref 135–145)

## 2024-06-07 NOTE — Patient Instructions (Signed)

## 2024-06-07 NOTE — Progress Notes (Unsigned)
 Subjective:  Patient ID: [name], female    DOB: December 11, 1940  Age: 83 y.o. MRN: [id number]  CC: Medical Management of Chronic Issues (Labs and prescription refills. Do you recommend [facility]. Patient had a CT done and wants to know if she needs to see cardiology. ), Hypertension, and Hypothyroidism   HPI [name] presents for f/up ----  Discussed the use of AI scribe software for clinical note transcription with the patient, who gave verbal consent to proceed.  History of Present Illness   [name] is an 83 year old female who presents with concerns about coronary artery calcification and low diastolic blood pressure.  She has undergone three CT scans in the past year, with the most recent one showing coronary artery calcification. She experiences occasional heart palpitations and infrequent sharp chest pain, which last occurred a couple of days ago, sometimes upon waking in the morning. She regularly walks on a treadmill for 30 to 45 minutes, covering 2.5 to 3.2 miles, without experiencing exertional symptoms.  She does not experience weakness, dizziness, or lightheadedness with these readings.  No swelling in her legs or feet.       Outpatient Medications Prior to Visit  Medication Sig Dispense Refill   ALPRAZolam  (XANAX ) 0.25 MG tablet TAKE ONE TABLET BY MOUTH THREE TIMES DAILY AS NEEDED FOR SLEEP OR ANXIETY 180 tablet 1   Biotin 5000 MCG CAPS Take by mouth.     carvedilol  (COREG ) 3.125 MG tablet Take 1 tablet (3.125 mg total) by mouth 2 (two) times daily with a meal. TAKE 1 TABLET BY MOUTH TWICE DAILY WITH A MEAL. Patient to follow up with PCP prior to future refills 60 tablet 0   Cholecalciferol (VITAMIN D3) 5000 UNITS TABS Take 1 tablet by mouth daily.     Coenzyme Q10 (COQ10) 100 MG CAPS Take by mouth.     Cyanocobalamin (VITAMIN B-12 CR) 1500 MCG TBCR Take 1 tablet by mouth 3 (three) times a week.     LIVALO  2 MG TABS Take 1 tablet by mouth once  daily 90 tablet 0   NP THYROID  30 MG tablet TAKE 1 TABLET BY MOUTH ONCE DAILY BEFORE BREAKFAST 90 tablet 0   Omega-3 Fatty Acids (FISH OIL) 1000 MG CAPS Take 2 capsules by mouth daily.     pantoprazole  (PROTONIX ) 40 MG tablet TAKE 1 TABLET BY MOUTH ONCE DAILY AS NEEDED FOR ACID REFLUX 90 tablet 0   zinc gluconate 50 MG tablet Take 50 mg by mouth daily.     No facility-administered medications prior to visit.    ROS Review of Systems  Objective:  BP (!) 142/78 (BP Location: Left Arm, Patient Position: Sitting, Cuff Size: Normal)   Pulse 60   Temp 98.2 F (36.8 C) (Oral)   Resp 16   Ht 5' (1.524 m)   Wt 124 lb 6.4 oz (56.4 kg)   SpO2 95%   BMI 24.30 kg/m   BP Readings from Last 3 Encounters:  06/07/24 (!) 142/78  12/06/23 116/74  11/22/23 112/72    Wt Readings from Last 3 Encounters:  06/07/24 124 lb 6.4 oz (56.4 kg)  01/27/24 125 lb (56.7 kg)  12/06/23 122 lb (55.3 kg)    Physical Exam Cardiovascular:     Rate and Rhythm: Normal rate and regular rhythm.     Heart sounds: Normal heart sounds, S1 normal and S2 normal.     Comments: EKG--- NSR, 64 bpm No LVH, Q waves, or  ST/T wave changes  unchanged Musculoskeletal:     Right lower leg: No edema.     Left lower leg: No edema.     Lab Results  Component Value Date   WBC 3.9 (L) 12/06/2023   HGB 14.0 12/06/2023   HCT 42.4 12/06/2023   PLT 200.0 12/06/2023   GLUCOSE 85 12/06/2023   CHOL 164 12/06/2023   TRIG 66.0 12/06/2023   HDL 69.50 12/06/2023   LDLDIRECT 130.0 08/03/2011   LDLCALC 81 12/06/2023   ALT 15 12/06/2023   AST 24 12/06/2023   NA 139 12/06/2023   K 4.5 12/06/2023   CL 103 12/06/2023   CREATININE 0.70 12/06/2023   BUN 12 12/06/2023   CO2 28 12/06/2023   TSH 2.14 12/06/2023   HGBA1C 5.5 06/06/2019    CT CHEST WO CONTRAST Result Date: 05/26/2024 CLINICAL DATA:  Lung nodule, < 6mm, high cancer risk EXAM: CT CHEST WITHOUT CONTRAST TECHNIQUE: Multidetector CT imaging of the chest was performed  following the standard protocol without IV contrast. RADIATION DOSE REDUCTION: This exam was performed according to the departmental dose-optimization program which includes automated exposure control, adjustment of the mA and/or kV according to patient size and/or use of iterative reconstruction technique. COMPARISON:  CT chest 10/27/2023 FINDINGS: Cardiovascular: Normal heart size. No significant pericardial effusion. The thoracic aorta is normal in caliber. Mild atherosclerotic plaque of the thoracic aorta. At least 2 vessel coronary artery calcifications. Mediastinum/Nodes: No gross hilar adenopathy, noting limited sensitivity for the detection of hilar adenopathy on this noncontrast study. No enlarged mediastinal or axillary lymph nodes. Thyroid  gland, trachea, and esophagus demonstrate no significant findings. Lungs/Pleura: Triangular 3 mm subpleural right lower lobe pulmonary micronodule likely representing an intrapulmonary lymph node-no further follow-up indicated. Stable left lower lobe subpleural micronodules (3: 94, 101, 106) -no further follow-up indicated. Stable lingular calcified pulmonary micronodule (3:96). Stable punctate left upper lobe micronodules-no further follow-up indicated. Interval development of a left upper lobe 0.3 x 0.2 cm pulmonary micronodule (3:58). Interval development of a cluster of ground-glass micro airspace opacities within the right upper lobe (3:57). No pulmonary mass. No pleural effusion. No pneumothorax. Upper Abdomen: Cholelithiasis.  Splenule noted. Musculoskeletal: No chest wall abnormality. No suspicious lytic or blastic osseous lesions. No acute displaced fracture. Multilevel degenerative changes of the spine. IMPRESSION: 1. Interval development of a cluster of ground-glass micro airspace opacities within the right upper lobe Adenocarcinoma cannot be excluded. Follow-up by CT is recommended in 2 years and again at 4 years. These recommendations are taken from:  Recommendations for the Management of Subsolid Pulmonary Nodules Detected at CT: A Statement from the Fleischner Society Radiology 2013; 266:1, 304-317. 2. Interval development of a left upper lobe 0.3 x 0.2 cm pulmonary micronodule. No follow-up needed if patient is low-risk.This recommendation follows the consensus statement: Guidelines for Management of Incidental Pulmonary Nodules Detected on CT Images: From the Fleischner Society 2017; Radiology 2017; 284:228-243. 3. Otherwise stable pulmonary nodules-no further follow-up indicated. 4. Aortic Atherosclerosis (ICD10-I70.0) including coronary artery calcification. 5. Cholelithiasis. Electronically Signed   By: Morgane  Naveau M.D.   On: 05/26/2024 01:23    Assessment & Plan:  Primary hypertension -     Basic metabolic panel with GFR; Future -     CBC with Differential/Platelet; Future -     EKG 12-Lead -     CT CARDIAC SCORING ([facility] LOCATIONS ONLY); Future  Acquired hypothyroidism -     TSH; Future  Tachycardia     Follow-up: Return in about  6 months (around 12/08/2024).  Debby Molt, MD

## 2024-06-08 MED ORDER — CARVEDILOL 3.125 MG PO TABS: 3.1250 mg | ORAL_TABLET | Freq: Two times a day (BID) | ORAL | 1 refills | Status: DC

## 2024-06-11 ENCOUNTER — Other Ambulatory Visit: Payer: Self-pay | Admitting: Internal Medicine

## 2024-06-11 DIAGNOSIS — H43813 Vitreous degeneration, bilateral: Secondary | ICD-10-CM | POA: Diagnosis not present

## 2024-06-11 DIAGNOSIS — H40013 Open angle with borderline findings, low risk, bilateral: Secondary | ICD-10-CM | POA: Diagnosis not present

## 2024-06-11 DIAGNOSIS — H0102B Squamous blepharitis left eye, upper and lower eyelids: Secondary | ICD-10-CM | POA: Diagnosis not present

## 2024-06-11 DIAGNOSIS — E785 Hyperlipidemia, unspecified: Secondary | ICD-10-CM

## 2024-06-11 DIAGNOSIS — H0102A Squamous blepharitis right eye, upper and lower eyelids: Secondary | ICD-10-CM | POA: Diagnosis not present

## 2024-06-11 MED ORDER — LIVALO 2 MG PO TABS: 1.0000 | ORAL_TABLET | Freq: Every day | ORAL | 1 refills | Status: DC

## 2024-06-14 ENCOUNTER — Ambulatory Visit
Admission: RE | Admit: 2024-06-14 | Discharge: 2024-06-14 | Disposition: A | Discharge status: Home / Self Care | Source: Ambulatory Visit | Attending: Internal Medicine | Admitting: Internal Medicine

## 2024-06-14 DIAGNOSIS — I10 Essential (primary) hypertension: Secondary | ICD-10-CM

## 2024-06-15 ENCOUNTER — Ambulatory Visit

## 2024-06-19 ENCOUNTER — Ambulatory Visit: Admitting: Acute Care

## 2024-06-19 ENCOUNTER — Encounter: Payer: Self-pay | Admitting: Acute Care

## 2024-06-19 VITALS — BP 112/65 | HR 86 | Temp 98.2°F | Ht 59.0 in | Wt 126.8 lb

## 2024-06-19 DIAGNOSIS — Z87891 Personal history of nicotine dependence: Secondary | ICD-10-CM

## 2024-06-19 DIAGNOSIS — R918 Other nonspecific abnormal finding of lung field: Secondary | ICD-10-CM

## 2024-06-19 NOTE — Patient Instructions (Signed)
 It is good to see you today. We have reviewed you scan . There are a few new tiny nodules that I want to watch closely. We will do a 6 month follow up CT Chest. You will get a call to get this scheduled.  You will follow up with me after the scan to review the results.  Call if you need us  sooner.  Call to be seen sooner for any unexplained weight loss or blood in your sputum. Please contact office for sooner follow up if symptoms do not improve or worsen or seek emergency care

## 2024-06-19 NOTE — Progress Notes (Signed)
 History of Present Illness [name] is a 83 y.o. female  female former smoker with past medical history of gastroesophageal reflux, hyperlipidemia, former smoker quit in 1985.Patient had CT imaging of the chest in May 2024 and was referred for evaluation of lung nodules .  Patient was referred in June 2024 for pulmonary nodules by Glade DOROTHA Hope, MD.  Synopsis 83 year old female, past medical history of gastroesophageal reflux, hyperlipidemia, former smoker quit in 1985.Patient had CT imaging of the chest in May 2024 and was referred for evaluation of lung nodules. She initially had a chest x-ray that revealed a nodule in the lingula that abutted the major fissure. CT imaging reveals 7 x 9 x 9 mm lobulated nodule in the posterior portion of the lingula abutting the major fissure. The nodule itself contains small area of calcification.  Plan after that visit was for a 76-month follow-up CT chest to reevaluate the area of concern.  She is here to review the results of her latest scan.    06/19/2024 [name] is an 83 year old female who presents for follow-up of pulmonary nodules.  She has a history of pulmonary nodules, with a recent scan on May 18, 2024, showing stable nodules, and  interval development of a cluster of ground glass micro airspace opacities in the right upper lobe. And  a new three millimeter by two millimeter micro nodule in the left upper lobe.  No recent illness, changes in health, breathing issues, shortness of breath, hemoptysis, or unexplained weight loss. She maintains an active lifestyle, walking two to three miles on her treadmill every morning and performing her own housework.   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   While radiology recommend a 2 year follow up, we will reassess in 6 months to ensure no further growth of the right upper lobe and left upper lobe micro nodules. She is in agreement  with this plan.    Test Results: CT Chest 05/18/2024 Interval development of a cluster of ground-glass micro airspace opacities within the right upper lobe. Adenocarcinoma cannot be excluded. Follow-up by CT is recommended in 2 years and again at 4 years. These recommendations are taken from: Recommendations for the Management of Subsolid Pulmonary Nodules Detected at CT: A Statement from the Fleischner Society Radiology 2013; 266:1, 304-317. 2. Interval development of a left upper lobe 0.3 x 0.2 cm pulmonary micronodule. No follow-up needed if patient is low-risk.This recommendation follows the consensus statement: Guidelines for Management of Incidental Pulmonary Nodules Detected on CT Images: From the Fleischner Society 2017; Radiology 2017; 284:228-243. 3. Otherwise stable pulmonary nodules-no further follow-up indicated. 4. Aortic Atherosclerosis (ICD10-I70.0) including coronary artery calcification. 5. Cholelithiasis.  CT chest 10/27/2023  Previously noted 9 mm pulmonary nodule within the lingula has resolved in keeping with an infectious or inflammatory process. 2. Stable 4 mm noncalcified pulmonary nodules within the left lower lobe, likely benign given their stability over time. In a patient with risk factors for pulmonary malignancy, follow-up CT examination can be obtained in 6-12 months to confirm a benign etiology.     Latest Ref Rng & Units 06/07/2024   11:27 AM 12/06/2023   11:58 AM 03/31/2023   10:43 AM  CBC  WBC 4.0 - 10.5 K/uL 4.1  3.9  3.6   Hemoglobin 12.0 - 15.0 g/dL 85.8  85.9  86.9   Hematocrit 36.0 - 46.0 % 42.4  42.4  39.2  Platelets 150.0 - 400.0 K/uL 177.0  200.0  222.0        Latest Ref Rng & Units 06/07/2024   11:27 AM 12/06/2023   11:58 AM 03/31/2023   10:43 AM  BMP  Glucose 70 - 99 mg/dL 88  85  94   BUN 6 - 23 mg/dL 13  12  8    Creatinine 0.40 - 1.20 mg/dL 9.29  9.29  9.27   Sodium 135 - 145 mEq/L 139  139  138   Potassium 3.5 - 5.1 mEq/L 4.2   4.5  4.3   Chloride 96 - 112 mEq/L 102  103  102   CO2 19 - 32 mEq/L 30  28  30    Calcium  8.4 - 10.5 mg/dL 9.5  9.6  9.3     BNP No results found for: BNP  ProBNP    Component Value Date/Time   PROBNP 35.0 04/19/2018 1134    PFT No results found for: FEV1PRE, FEV1POST, FVCPRE, FVCPOST, TLC, DLCOUNC, PREFEV1FVCRT, PSTFEV1FVCRT  CT CARDIAC SCORING ([facility] LOCATIONS ONLY) Result Date: 06/18/2024 : CLINICAL DATA:  Cardiovascular Disease Risk stratification EXAM: Coronary Calcium  Score TECHNIQUE: A gated, non-contrast computed tomography scan of the heart was performed using 3mm slice thickness. Axial images were analyzed on a dedicated workstation. Calcium  scoring of the coronary arteries was performed using the Agatston method. FINDINGS: Coronary Calcium  Score: Left main: 0 Left anterior descending artery: 1.82 Left circumflex artery: 0 Right coronary artery: 0 Total: 1.82 Pericardium: Normal. Ascending Aorta: Normal caliber. Pulmonary artery: Normal caliber Non-cardiac: See separate report from [facility]. IMPRESSION: Coronary calcium  score of 1.82. This was 7 percentile for age-, race-, and sex-matched controls. Mild calcifications of mitral annulus noted. RECOMMENDATIONS: Coronary artery calcium  (CAC) score is a strong predictor of incident coronary heart disease (CHD) and provides predictive information beyond traditional risk factors. CAC scoring is reasonable to use in the decision to withhold, postpone, or initiate statin therapy in intermediate-risk or selected borderline-risk asymptomatic adults (age 61-75 years and LDL-C >=70 to <190 mg/dL) who do not have diabetes or established atherosclerotic cardiovascular disease (ASCVD).* In intermediate-risk (10-year ASCVD risk >=7.5% to <20%) adults or selected borderline-risk (10-year ASCVD risk >=5% to <7.5%) adults in whom a CAC score is measured for the purpose of making a treatment decision the following recommendations  have been made: If CAC=0, it is reasonable to withhold statin therapy and reassess in 5 to 10 years, as long as higher risk conditions are absent (diabetes mellitus, family history of premature CHD in first degree relatives (males <55 years; females <65 years), cigarette smoking, or LDL >=190 mg/dL). If CAC is 1 to 99, it is reasonable to initiate statin therapy for patients >=56 years of age. If CAC is >=100 or >=75th percentile, it is reasonable to initiate statin therapy at any age. Cardiology referral should be considered for patients with CAC scores >=400 or >=75th percentile. *2018 AHA/ACC/AACVPR/AAPA/ABC/ACPM/ADA/AGS/APhA/ASPC/NLA/PCNA Guideline on the Management of Blood Cholesterol: A Report of the American College of Cardiology/American Heart Association Task Force on Clinical Practice Guidelines. J Am Coll Cardiol. 2019;73(24):3168-3209. Electronically Signed   By: Lamar Fitch M.D.   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   Hx of cardiovascular stress test    ETT-Myoview  (04/2014):  No ischemia, EF  85%; Normal // Myoview  11/17: EF 92, no scar or ischemia, Low Risk // Nuclear stress test 9/19: EF 84, no ischemia; Low Risk    Hx of echocardiogram    Echo (12/15):  EF 55-60%, no RWMA, normal diast function, MAC, LA 33 mm   Hyperlipidemia    HYPOTHYROIDISM 04/30/2009   Irritable bowel syndrome 04/30/2009   RAYNAUD'S DISEASE 04/30/2009     Social History   Tobacco Use   Smoking status: Former    Current packs/day: 0.00    Types: Cigarettes    Quit date: 10/27/1984    Years since quitting: 39.6   Smokeless tobacco: Never   Tobacco comments:    quit 1985  Vaping Use   Vaping status: Never Used  Substance Use Topics   Alcohol use: Yes    Alcohol/week: 0.0 standard drinks of alcohol    Comment:  1-2 per week   Drug use: No    Ms.Meaders reports that she quit smoking about 39 years ago. Her smoking use included cigarettes. She has never used smokeless tobacco. She reports current alcohol use. She reports that she does not use drugs.  Tobacco Cessation: Counseling given: Not Answered Tobacco comments: quit 1985 Former smoker quit 1985  Past surgical hx, Family hx, Social hx all reviewed.  Current Outpatient Medications on File Prior to Visit  Medication Sig   ALPRAZolam  (XANAX ) 0.25 MG tablet TAKE ONE TABLET BY MOUTH THREE TIMES DAILY AS NEEDED FOR SLEEP OR ANXIETY   Biotin 5000 MCG CAPS Take by mouth.   carvedilol  (COREG ) 3.125 MG tablet Take 1 tablet (3.125 mg total) by mouth 2 (two) times daily with a meal. T   Cholecalciferol (VITAMIN D3) 5000 UNITS TABS Take 1 tablet by mouth daily.   Coenzyme Q10 (COQ10) 100 MG CAPS Take by mouth.   Cyanocobalamin (VITAMIN B-12 CR) 1500 MCG TBCR Take 1 tablet by mouth 3 (three) times a week.   LIVALO  2 MG TABS Take 1 tablet (2 mg total) by mouth daily.   NP THYROID  30 MG tablet TAKE 1 TABLET BY MOUTH ONCE DAILY BEFORE BREAKFAST   Omega-3 Fatty Acids (FISH OIL) 1000 MG CAPS Take 2 capsules by mouth daily.   pantoprazole  (PROTONIX ) 40 MG tablet TAKE 1 TABLET BY MOUTH ONCE DAILY AS NEEDED FOR ACID REFLUX   zinc gluconate 50 MG tablet Take 50 mg by mouth daily.   No current facility-administered medications on file prior to visit.     No Known Allergies  Review Of Systems:  Constitutional:   No  weight loss, night sweats,  Fevers, chills, fatigue, or  lassitude.  HEENT:   No headaches,  Difficulty swallowing,  Tooth/dental problems, or  Sore throat,                No sneezing, itching, ear ache, nasal congestion, post nasal drip,   CV:  No chest pain,  Orthopnea, PND, swelling in lower extremities, anasarca, dizziness, palpitations, syncope.   GI  No heartburn, indigestion, abdominal pain, nausea, vomiting, diarrhea, change in bowel  habits, loss of appetite, bloody stools.   Resp: No shortness of breath with exertion or at rest.  No excess mucus, no productive cough,  No non-productive cough,  No coughing up of blood.  No change in color of mucus.  No wheezing.  No chest wall deformity  Skin: no rash or lesions.  GU: no dysuria, change in color of urine, no urgency or frequency.  No flank pain, no hematuria  MS:  No joint pain or swelling.  No decreased range of motion.  No back pain.  Psych:  No change in mood or affect. No depression or anxiety.  No memory loss.   Vital Signs BP 112/65 (BP Location: Left Arm, Patient Position: Sitting, Cuff Size: Normal)   Pulse 86   Temp 98.2 F (36.8 C) (Oral)   Ht 4' 11 (1.499 m)   Wt 126 lb 12.8 oz (57.5 kg)   SpO2 95%   BMI 25.61 kg/m    Physical Exam:  General- No distress,  A&Ox3, pleasant ENT: No sinus tenderness, TM clear, pale nasal mucosa, no oral exudate,no post nasal drip, no LAN Cardiac: S1, S2, regular rate and rhythm, no murmur Chest: No wheeze/ rales/ dullness; no accessory muscle use, no nasal flaring, no sternal retractions, slightly diminished per bases Abd.: Soft Non-tender, ND, BS +, Body mass index is 25.61 kg/m.  Ext: No clubbing cyanosis, edema, no obvious deformities Neuro:  normal strength, MAE x 4, A&O x 3 Skin: No rashes, warm and dry, no obvious skin lesions  Psych: normal mood and behavior   Assessment/Plan Recent scan showed new ground glass micro opacities in the right upper lobe and a new micronodule in the left upper lobe. Stable nodules in the left lower lobe likely benign. Adenocarcinoma cannot be excluded, per radiology, but monitoring is preferred. - Schedule follow-up scan in six months. - Arrange follow-up appointment 1-2 weeks post-scan to discuss results. - Instruct to report unexplained weight loss or hemoptysis.  I spent 20 minutes dedicated to the care of this patient on the date of this encounter to include pre-visit  review of records, face-to-face time with the patient discussing conditions above, post visit ordering of testing, clinical documentation with the electronic health record, making appropriate referrals as documented, and communicating necessary information to the patient's healthcare team.   Lauraine JULIANNA Lites, NP 06/19/2024  3:34 PM

## 2024-08-16 ENCOUNTER — Other Ambulatory Visit: Payer: Self-pay | Admitting: Internal Medicine

## 2024-08-16 DIAGNOSIS — E039 Hypothyroidism, unspecified: Secondary | ICD-10-CM

## 2024-09-26 ENCOUNTER — Ambulatory Visit: Payer: Self-pay

## 2024-09-26 NOTE — Telephone Encounter (Signed)
 FYI Only or Action Required?: FYI only for provider: appointment scheduled on 09/27/2024.  Patient was last seen in primary care on 06/07/2024 by Joshua Debby CROME, MD.  Called Nurse Triage reporting Abdominal Pain.  Symptoms began several days ago.  Interventions attempted: Rest, hydration, or home remedies.  Symptoms are: gradually improving.  Triage Disposition: See Physician Within 24 Hours  Patient/caregiver understands and will follow disposition?: Yes  Copied from CRM #[id number]. Topic: Clinical - Red Word Triage >> Sep 26, 2024  9:31 AM Alfonso HERO wrote: Red Word that prompted transfer to Nurse Triage: soreness in abdomin and fever and chills. Reason for Disposition  [1] MODERATE pain (e.g., interferes with normal activities) AND [2] pain comes and goes (cramps) AND [3] present > 24 hours  (Exception: Pain with Vomiting or Diarrhea - see that Guideline.)  Answer Assessment - Initial Assessment Questions 1. LOCATION: Where does it hurt?      Lower mid abdomen, pelvic 2. RADIATION: Does the pain shoot anywhere else? (e.g., chest, back)     denies 3. ONSET: When did the pain begin? (e.g., minutes, hours or days ago)      Four days ago  5. PATTERN Does the pain come and go, or is it constant?     Comes and goes 6. SEVERITY: How bad is the pain?  (e.g., Scale 1-10; mild, moderate, or severe)     Pain improving 7. RECURRENT SYMPTOM: Have you ever had this type of stomach pain before? If Yes, ask: When was the last time? and What happened that time?      denies 8. CAUSE: What do you think is causing the stomach pain? (e.g., gallstones, recent abdominal surgery)     unknown 9. RELIEVING/AGGRAVATING FACTORS: What makes it better or worse? (e.g., antacids, bending or twisting motion, bowel movement)     Sitting feels pressure, 10. OTHER SYMPTOMS: Do you have any other symptoms? (e.g., back pain, diarrhea, fever, urination pain, vomiting)       Low grade fever and  chills,denies pain with urination or any blood in urine or stool, decreased appetite  Protocols used: Abdominal Pain - Female-A-AH

## 2024-09-26 NOTE — Progress Notes (Unsigned)
    Subjective:    Patient ID: [name], female    DOB: Mar 24, 1941, 83 y.o.   MRN: [id number]      HPI [name] is here for No chief complaint on file.  Ct Ab/P normal - may 2024 - was having LLQ pain      Medications and allergies reviewed with patient and updated if appropriate.  Current Outpatient Medications on File Prior to Visit  Medication Sig Dispense Refill   ALPRAZolam  (XANAX ) 0.25 MG tablet TAKE ONE TABLET BY MOUTH THREE TIMES DAILY AS NEEDED FOR SLEEP OR ANXIETY 180 tablet 1   Biotin 5000 MCG CAPS Take by mouth.     carvedilol  (COREG ) 3.125 MG tablet Take 1 tablet (3.125 mg total) by mouth 2 (two) times daily with a meal. T 180 tablet 1   Cholecalciferol (VITAMIN D3) 5000 UNITS TABS Take 1 tablet by mouth daily.     Coenzyme Q10 (COQ10) 100 MG CAPS Take by mouth.     Cyanocobalamin (VITAMIN B-12 CR) 1500 MCG TBCR Take 1 tablet by mouth 3 (three) times a week.     LIVALO  2 MG TABS Take 1 tablet (2 mg total) by mouth daily. 90 tablet 1   NP THYROID  30 MG tablet TAKE 1 TABLET BY MOUTH ONCE DAILY BEFORE BREAKFAST 90 tablet 0   Omega-3 Fatty Acids (FISH OIL) 1000 MG CAPS Take 2 capsules by mouth daily.     pantoprazole  (PROTONIX ) 40 MG tablet TAKE 1 TABLET BY MOUTH ONCE DAILY AS NEEDED FOR ACID REFLUX 90 tablet 0   zinc gluconate 50 MG tablet Take 50 mg by mouth daily.     No current facility-administered medications on file prior to visit.    Review of Systems     Objective:  There were no vitals filed for this visit. BP Readings from Last 3 Encounters:  06/19/24 112/65  06/07/24 (!) 142/78  12/06/23 116/74   Wt Readings from Last 3 Encounters:  06/19/24 126 lb 12.8 oz (57.5 kg)  06/07/24 124 lb 6.4 oz (56.4 kg)  01/27/24 125 lb (56.7 kg)   There is no height or weight on file to calculate BMI.    Physical Exam         Assessment & Plan:    See Problem List for Assessment and Plan of chronic medical problems.

## 2024-09-27 ENCOUNTER — Encounter: Payer: Self-pay | Admitting: Internal Medicine

## 2024-09-27 ENCOUNTER — Ambulatory Visit (INDEPENDENT_AMBULATORY_CARE_PROVIDER_SITE_OTHER): Admitting: Internal Medicine

## 2024-09-27 ENCOUNTER — Ambulatory Visit: Payer: Self-pay | Admitting: Internal Medicine

## 2024-09-27 VITALS — BP 126/80 | HR 66 | Temp 98.4°F | Ht 59.0 in | Wt 124.0 lb

## 2024-09-27 DIAGNOSIS — R350 Frequency of micturition: Secondary | ICD-10-CM | POA: Diagnosis not present

## 2024-09-27 DIAGNOSIS — I10 Essential (primary) hypertension: Secondary | ICD-10-CM

## 2024-09-27 DIAGNOSIS — R1030 Lower abdominal pain, unspecified: Secondary | ICD-10-CM

## 2024-09-27 LAB — COMPREHENSIVE METABOLIC PANEL WITH GFR
ALT: 9 U/L (ref 0–35)
AST: 15 U/L (ref 0–37)
Albumin: 4 g/dL (ref 3.5–5.2)
Alkaline Phosphatase: 48 U/L (ref 39–117)
BUN: 8 mg/dL (ref 6–23)
CO2: 30 meq/L (ref 19–32)
Calcium: 9 mg/dL (ref 8.4–10.5)
Chloride: 101 meq/L (ref 96–112)
Creatinine, Ser: 0.58 mg/dL (ref 0.40–1.20)
GFR: 84.1 mL/min (ref >60.00)
Glucose, Bld: 84 mg/dL (ref 70–99)
Potassium: 4.2 meq/L (ref 3.5–5.1)
Sodium: 138 meq/L (ref 135–145)
Total Bilirubin: 0.5 mg/dL (ref 0.2–1.2)
Total Protein: 6.8 g/dL (ref 6.0–8.3)

## 2024-09-27 LAB — URINALYSIS, ROUTINE W REFLEX MICROSCOPIC
Bilirubin Urine: NEGATIVE
Ketones, ur: NEGATIVE
Nitrite: NEGATIVE
Specific Gravity, Urine: <=1.005 — AB (ref 1.000–1.030)
Total Protein, Urine: NEGATIVE
Urine Glucose: NEGATIVE
Urobilinogen, UA: 1 (ref 0.0–1.0)
pH: 6 (ref 5.0–8.0)

## 2024-09-27 LAB — CBC WITH DIFFERENTIAL/PLATELET
Basophils Absolute: 0 K/uL (ref 0.0–0.1)
Basophils Relative: 0.8 % (ref 0.0–3.0)
Eosinophils Absolute: 0.1 K/uL (ref 0.0–0.7)
Eosinophils Relative: 2.1 % (ref 0.0–5.0)
HCT: 39.3 % (ref 36.0–46.0)
Hemoglobin: 13 g/dL (ref 12.0–15.0)
Lymphocytes Relative: 27.6 % (ref 12.0–46.0)
Lymphs Abs: 1.2 K/uL (ref 0.7–4.0)
MCHC: 33.2 g/dL (ref 30.0–36.0)
MCV: 91 fl (ref 78.0–100.0)
Monocytes Absolute: 0.4 K/uL (ref 0.1–1.0)
Monocytes Relative: 10.4 % (ref 3.0–12.0)
Neutro Abs: 2.5 K/uL (ref 1.4–7.7)
Neutrophils Relative %: 59.1 % (ref 43.0–77.0)
Platelets: 223 K/uL (ref 150.0–400.0)
RBC: 4.31 Mil/uL (ref 3.87–5.11)
RDW: 13.4 % (ref 11.5–15.5)
WBC: 4.3 K/uL (ref 4.0–10.5)

## 2024-09-27 LAB — LIPASE: Lipase: 34 U/L (ref 11.0–59.0)

## 2024-09-27 LAB — AMYLASE: Amylase: 49 U/L (ref 27–131)

## 2024-09-27 NOTE — Patient Instructions (Addendum)
      Blood work was ordered.       Medications changes include :   None    A CTi   was ordered and someone will call you to schedule an appointment.     Return if symptoms worsen or fail to improve.

## 2024-09-28 LAB — URINE CULTURE: Result:: NO GROWTH

## 2024-10-01 ENCOUNTER — Ambulatory Visit (HOSPITAL_BASED_OUTPATIENT_CLINIC_OR_DEPARTMENT_OTHER)
Admission: RE | Admit: 2024-10-01 | Discharge: 2024-10-01 | Disposition: A | Discharge status: Home / Self Care | Source: Ambulatory Visit | Attending: Internal Medicine | Admitting: Internal Medicine

## 2024-10-01 DIAGNOSIS — R1030 Lower abdominal pain, unspecified: Secondary | ICD-10-CM | POA: Diagnosis present

## 2024-10-01 MED ORDER — AMOXICILLIN-POT CLAVULANATE 875-125 MG PO TABS: 1.0000 | ORAL_TABLET | Freq: Two times a day (BID) | ORAL | 0 refills | Status: AC

## 2024-10-01 MED ORDER — IOHEXOL 300 MG/ML  SOLN
85.0000 mL | Freq: Once | INTRAMUSCULAR | Status: AC | PRN
  Administered 2024-10-01: 85 mL via INTRAVENOUS

## 2024-11-01 ENCOUNTER — Ambulatory Visit: Admitting: Internal Medicine

## 2024-11-01 ENCOUNTER — Ambulatory Visit (INDEPENDENT_AMBULATORY_CARE_PROVIDER_SITE_OTHER)

## 2024-11-01 ENCOUNTER — Encounter: Payer: Self-pay | Admitting: Internal Medicine

## 2024-11-01 VITALS — BP 142/68 | HR 90 | Temp 98.5°F | Resp 16 | Ht 59.0 in | Wt 126.4 lb

## 2024-11-01 DIAGNOSIS — M545 Low back pain, unspecified: Secondary | ICD-10-CM | POA: Diagnosis not present

## 2024-11-01 DIAGNOSIS — E785 Hyperlipidemia, unspecified: Secondary | ICD-10-CM

## 2024-11-01 DIAGNOSIS — I10 Essential (primary) hypertension: Secondary | ICD-10-CM

## 2024-11-01 DIAGNOSIS — R000 Tachycardia, unspecified: Secondary | ICD-10-CM

## 2024-11-01 DIAGNOSIS — E039 Hypothyroidism, unspecified: Secondary | ICD-10-CM

## 2024-11-01 NOTE — Patient Instructions (Signed)
 Managing Chronic Back Pain Chronic back pain is pain that lasts longer than 3 months. It often affects the lower back. It may feel like a muscle ache or a sharp, stabbing pain. It can be mild, moderate, or severe. There are things you can do to help manage your pain. See what works best for you. Your health care provider may also give you other instructions. What actions can I take to manage my chronic back pain? You may be given a treatment plan by your provider. Treatment often starts with rest and pain relief. It may also include: Physical therapy. These are exercises to help restore movement and strength to your back. Techniques to help you relax. Counseling or therapy. Cognitive behavioral therapy (CBT) is a form of therapy that helps you set goals and make changes. Acupuncture or massage therapy. Local electrical stimulation. Injections. You may be given medicines to numb an area or relieve pain. If other treatments do not help, you may need surgery. How to use body mechanics and posture to help with pain You can help relieve stress on your back with good posture and healthy body mechanics. Body mechanics are all the ways your body moves during the day. Posture is part of body mechanics. Good posture means: Your spine is in its correct S-curve, or neutral, position. Your shoulders are pulled back a bit. Your head is not tipped forward. To improve your posture and body mechanics, follow these guidelines. Standing  When standing, keep your feet about hip-width apart. Keep your knees slightly bent. Your ears, shoulders, and hips should line up. Your spine should be neutral. When you stand in one place for a long time, place one foot on a stable object that is 2-4 inches (5-10 cm) high, such as a footstool. Sitting  When sitting, keep your feet flat on the floor. Use a footrest, if needed. Keep your thighs parallel to the floor. Try not to round your shoulders or tilt your head  forward. When working at a desk or a computer: Position your desk so your hands are a little lower than your elbows. Slide your chair under your desk so you are close enough to have good posture. Position your monitor so you are looking straight ahead and do not have to tilt your head to view the screen. Lifting  Keep your feet shoulder-width apart. Tighten the muscles of your abdomen. Bend your knees and hips. Keep your spine neutral. Lift using the strength of your legs, not your back. Do not lock your knees straight out. Ask for help to lift heavy or awkward objects. Resting  Do not lie down in a way that causes pain. If you have pain when you sit, bend, stoop, or squat, lie in a way that your body does not bend much. Try not to curl up on your side with your arms and knees near your chest (fetal position). If it hurts to stand for a long time or reach with your arms, lie with your spine neutral and knees bent slightly. Try lying: On your side with a pillow between your knees. On your back with a pillow under your knees. How to recognize changes in your chronic back pain Let your provider know if your pain gets worse or does not get better with treatment. Your back pain may be getting worse if you have pain that: Starts to cause problems with your posture. Gets worse when you sit, stand, walk, bend, or lift things. Happens when you are active,  at rest, or both. Makes it hard for you to move around (limits mobility). Occurs with fever, weight loss, or trouble peeing (urinating). Causes numbness and tingling. Follow these instructions at home: Medicines You may need to take medicines for pain and inflammation. These may be taken by mouth or put on the skin. You may also be given muscle relaxants. Take over-the-counter and prescription medicines only as told by your provider. Ask your provider if the medicine prescribed to you: Requires you to avoid driving or using machinery. Can  cause constipation. You may need to take these actions to prevent or treat constipation: Drink enough fluid to keep your pee (urine) pale yellow. Take over-the-counter or prescription medicines. Eat foods that are high in fiber, such as beans, whole grains, and fresh fruits and vegetables. Limit foods that are high in fat and processed sugars, such as fried or sweet foods. Lifestyle Do not use any products that contain nicotine or tobacco. These products include cigarettes, chewing tobacco, and vaping devices, such as e-cigarettes. If you need help quitting, ask your provider. Eat a healthy diet. Eat lots of vegetables, fruits, fish, and lean meats. Work with your provider to stay at a healthy weight. General instructions Get regular exercise as told. Exercise can help with flexibility and strength. If physical therapy was prescribed, do exercises as told by your provider. Use ice or heat therapy as told by your provider. Where can I get support? Think about joining a support group for people with chronic back pain. You can find some groups at: Pain Connection Program: painconnection.org The American Chronic Pain Association: acpanow.com Contact a health care provider if: Your pain does not get better with rest or medicine. You have new pain. You have a fever. You lose weight quickly. You have trouble doing your normal activities. You feel weak or numb in one or both of your legs or feet. Get help right away if: You are not able to control when you pee or poop. You have severe back pain and: Nausea or vomiting. Pain in your chest or abdomen. Shortness of breath. You faint. These symptoms may be an emergency. Get help right away. Call 911. Do not wait to see if the symptoms will go away. Do not drive yourself to the hospital. This information is not intended to replace advice given to you by your health care provider. Make sure you discuss any questions you have with your health care  provider. Document Revised: 06/28/2022 Document Reviewed: 06/28/2022 Elsevier Patient Education  2024 [employer].

## 2024-11-01 NOTE — Progress Notes (Unsigned)
 Subjective:  Patient ID: [name], female    DOB: May 31, 1941  Age: 83 y.o. MRN: [id number]  CC: Back Pain (Lower back feels tired she wants to know if this is connected to the abdominal pain that she was having. She states that she has arthritis and wants to know if this could be it? )   HPI [name] presents for f/up ---  Discussed the use of AI scribe software for clinical note transcription with the patient, who gave verbal consent to proceed.  History of Present Illness [name] is an 83 year old female who presents with back pain and concerns about colon cancer.  She has been experiencing back pain for the past ten days, described as a 'tired pain' across her back without radiation. There have been no recent falls or injuries, and the pain is not severe. She is able to walk on the treadmill every morning without difficulty and reads while walking. She has not taken any medication for the back pain as she prefers to avoid medication if possible. No dizziness, lightheadedness, chest pain, or shortness of breath. There is no numbness, weakness, or tingling in her legs.  In October, she was diagnosed with diverticulitis after experiencing stomach pain. A CT scan was performed at that time, and she was treated with antibiotics for ten days, which resolved the abdominal pain. During the treatment, she experienced stools with a lot of mucus, but her bowel movements have since returned to normal. She denies any blood in her stool, weight loss, or changes in bowel habits. She is concerned about colon cancer due to a friend's experience and inquires about non-colonoscopy tests for colon cancer. Her last colonoscopy was in 2012.  She reports a history of low blood pressure readings at home, although her blood pressure tends to be higher during office visits.   Outpatient Medications Prior to Visit  Medication Sig Dispense Refill   ALPRAZolam  (XANAX ) 0.25 MG tablet TAKE  ONE TABLET BY MOUTH THREE TIMES DAILY AS NEEDED FOR SLEEP OR ANXIETY 180 tablet 1   Biotin 5000 MCG CAPS Take by mouth.     Cholecalciferol (VITAMIN D3) 5000 UNITS TABS Take 1 tablet by mouth daily.     Coenzyme Q10 (COQ10) 100 MG CAPS Take by mouth.     Cyanocobalamin (VITAMIN B-12 CR) 1500 MCG TBCR Take 1 tablet by mouth 3 (three) times a week.     Omega-3 Fatty Acids (FISH OIL) 1000 MG CAPS Take 2 capsules by mouth daily.     pantoprazole  (PROTONIX ) 40 MG tablet TAKE 1 TABLET BY MOUTH ONCE DAILY AS NEEDED FOR ACID REFLUX 90 tablet 0   zinc gluconate 50 MG tablet Take 50 mg by mouth daily.     carvedilol  (COREG ) 3.125 MG tablet Take 1 tablet (3.125 mg total) by mouth 2 (two) times daily with a meal. T 180 tablet 1   LIVALO  2 MG TABS Take 1 tablet (2 mg total) by mouth daily. 90 tablet 1   NP THYROID  30 MG tablet TAKE 1 TABLET BY MOUTH ONCE DAILY BEFORE BREAKFAST 90 tablet 0   No facility-administered medications prior to visit.    ROS Review of Systems  Constitutional:  Negative for appetite change, chills, diaphoresis, fatigue and fever.  HENT: Negative.  Negative for trouble swallowing.   Eyes: Negative.   Respiratory:  Negative for chest tightness, shortness of breath and wheezing.   Cardiovascular:  Negative for chest pain, palpitations and leg swelling.  Gastrointestinal:  Negative for abdominal pain, constipation, diarrhea, nausea and vomiting.  Endocrine: Negative.  Negative for cold intolerance and heat intolerance.  Genitourinary: Negative.  Negative for difficulty urinating and dysuria.  Musculoskeletal:  Positive for back pain. Negative for myalgias and neck pain.  Skin: Negative.   Neurological: Negative.  Negative for dizziness, seizures and weakness.  Hematological:  Negative for adenopathy. Does not bruise/bleed easily.  Psychiatric/Behavioral: Negative.      Objective:  BP (!) 142/68 (BP Location: Left Arm, Patient Position: Sitting, Cuff Size: Normal)   Pulse 90    Temp 98.5 F (36.9 C) (Oral)   Resp 16   Ht 4' 11 (1.499 m)   Wt 126 lb 6.4 oz (57.3 kg)   SpO2 99%   BMI 25.53 kg/m   BP Readings from Last 3 Encounters:  11/01/24 (!) 142/68  09/27/24 126/80  06/19/24 112/65    Wt Readings from Last 3 Encounters:  11/01/24 126 lb 6.4 oz (57.3 kg)  09/27/24 124 lb (56.2 kg)  06/19/24 126 lb 12.8 oz (57.5 kg)    Physical Exam Vitals reviewed.  Constitutional:      Appearance: Normal appearance.  HENT:     Nose: Nose normal.     Mouth/Throat:     Mouth: Mucous membranes are moist.  Eyes:     General: No scleral icterus.    Conjunctiva/sclera: Conjunctivae normal.  Cardiovascular:     Rate and Rhythm: Normal rate and regular rhythm.     Heart sounds: No murmur heard.    No friction rub. No gallop.  Pulmonary:     Effort: Pulmonary effort is normal.     Breath sounds: No stridor. No wheezing, rhonchi or rales.  Abdominal:     General: Abdomen is flat.     Palpations: There is no mass.     Tenderness: There is no abdominal tenderness. There is no guarding.     Hernia: No hernia is present.  Musculoskeletal:        General: Normal range of motion.     Cervical back: Neck supple.     Right lower leg: No edema.     Left lower leg: No edema.  Lymphadenopathy:     Cervical: No cervical adenopathy.  Skin:    General: Skin is warm and dry.  Neurological:     General: No focal deficit present.     Mental Status: She is alert.  Psychiatric:        Mood and Affect: Mood normal.        Behavior: Behavior normal.     Lab Results  Component Value Date   WBC 4.3 09/27/2024   HGB 13.0 09/27/2024   HCT 39.3 09/27/2024   PLT 223.0 09/27/2024   GLUCOSE 84 09/27/2024   CHOL 149 11/01/2024   TRIG 115.0 11/01/2024   HDL 61.80 11/01/2024   LDLDIRECT 130.0 08/03/2011   LDLCALC 64 11/01/2024   ALT 9 09/27/2024   AST 15 09/27/2024   NA 138 09/27/2024   K 4.2 09/27/2024   CL 101 09/27/2024   CREATININE 0.58 09/27/2024   BUN 8  09/27/2024   CO2 30 09/27/2024   TSH 2.42 11/01/2024   HGBA1C 5.5 06/06/2019    CT ABDOMEN PELVIS W CONTRAST Result Date: 10/01/2024 EXAM: CT ABDOMEN AND PELVIS WITH CONTRAST 10/01/2024 05:58:01 PM TECHNIQUE: CT of the abdomen and pelvis was performed with the administration of 85 mL of iohexol  (OMNIPAQUE ) 300 MG/ML solution. Multiplanar reformatted images are provided for  review. Automated exposure control, iterative reconstruction, and/or weight-based adjustment of the mA/kV was utilized to reduce the radiation dose to as low as reasonably achievable. COMPARISON: 04/06/2023 CLINICAL HISTORY: Abdominal pain, acute, nonlocalized; lower abdominal pain - tenderness on exam, decreased appetite. FINDINGS: LOWER CHEST: Likely subsegmental atelectasis in the right middle lobe and lingula. LIVER: The liver is unremarkable. GALLBLADDER AND BILE DUCTS: Decompressed gallbladder containing a single radiopaque gallstone. No biliary ductal dilatation. SPLEEN: No acute abnormality. PANCREAS: No acute abnormality. ADRENAL GLANDS: No acute abnormality. KIDNEYS, URETERS AND BLADDER: No stones in the kidneys or ureters. No hydronephrosis. No perinephric or periureteral stranding. Mild circumferential wall thickening of the urinary bladder. GI AND BOWEL: Decompressed stomach containing ingested material. Small periampullary duodenal diverticulum measuring 1.2 cm. Multiple sigmoid diverticula noted with a small amount of pericolonic inflammation near the rectosigmoid junction in the pelvis. Decompressed normal appendix. There is no bowel obstruction. PERITONEUM AND RETROPERITONEUM: No ascites. No free air. VASCULATURE: The portal vein is patent. Scattered aortoiliac atherosclerosis. Aorta is normal in caliber. LYMPH NODES: No lymphadenopathy. REPRODUCTIVE ORGANS: No acute abnormality. BONES AND SOFT TISSUES: Diffuse osteopenia. 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 Mild bilateral hip osteoarthritis.  No focal soft tissue abnormality. IMPRESSION: 1. Acute uncomplicated sigmoid diverticulitis. No pericolonic abscess or pneumoperitoneum. 2. Mild circumferential wall thickening of the urinary bladder, which may be due to underdistention or acute cystitis. Correlation with urinalysis recommended. Electronically signed by: Rogelia Myers MD 10/01/2024 06:40 PM EST RP Workstation: [id number]   DG Lumbar Spine Complete Result Date: 11/01/2024 CLINICAL DATA:  Low back pain 10 days. EXAM: LUMBAR SPINE - COMPLETE 4+ VIEW COMPARISON:  None Available. FINDINGS: Subtle curvature of the lumbar spine convex left. Mild diffuse decreased bone mineralization. Mild to moderate spondylosis of the lumbar spine to include facet arthropathy over the lower lumbar spine. No evidence of compression fracture. No significant spondylolisthesis or spondylolysis. Disc space narrowing at the L2-3, L3-4 L4-5 levels. IMPRESSION: 1. No acute findings. 2. 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 Electronically Signed   By: Toribio Agreste M.D.   On: 11/01/2024 15:53     Assessment & Plan:  Acquired hypothyroidism- She is euthyroid. -     TSH; Future  Primary hypertension- Her BP is well controlled.  Hyperlipidemia with target LDL less than 130- LDL goal achieved. Doing well on the statin  -     Lipid panel; Future -     TSH; Future  Acute bilateral low back pain without sciatica -     DG Lumbar Spine Complete; Future     Follow-up: Return in about 6 months (around 05/02/2025).  Debby Molt, MD

## 2024-11-02 ENCOUNTER — Ambulatory Visit: Payer: Self-pay | Admitting: Internal Medicine

## 2024-11-02 LAB — LIPID PANEL
Cholesterol: 149 mg/dL (ref 0–200)
HDL: 61.8 mg/dL (ref >39.00)
LDL Cholesterol: 64 mg/dL (ref 0–99)
NonHDL: 86.91
Total CHOL/HDL Ratio: 2
Triglycerides: 115 mg/dL (ref 0.0–149.0)
VLDL: 23 mg/dL (ref 0.0–40.0)

## 2024-11-02 LAB — TSH: TSH: 2.42 u[IU]/mL (ref 0.35–5.50)

## 2024-11-02 MED ORDER — THYROID 30 MG PO TABS: 30.0000 mg | ORAL_TABLET | Freq: Every day | ORAL | 0 refills | Status: AC

## 2024-11-02 MED ORDER — LIVALO 2 MG PO TABS: 1.0000 | ORAL_TABLET | Freq: Every day | ORAL | 1 refills | Status: AC

## 2024-11-02 MED ORDER — CARVEDILOL 3.125 MG PO TABS: 3.1250 mg | ORAL_TABLET | Freq: Two times a day (BID) | ORAL | 1 refills | Status: AC

## 2024-11-08 ENCOUNTER — Ambulatory Visit: Admitting: Internal Medicine

## 2024-12-21 ENCOUNTER — Ambulatory Visit (HOSPITAL_BASED_OUTPATIENT_CLINIC_OR_DEPARTMENT_OTHER)
Admission: RE | Admit: 2024-12-21 | Discharge: 2024-12-21 | Disposition: A | Source: Ambulatory Visit | Attending: Acute Care | Admitting: Acute Care

## 2024-12-21 DIAGNOSIS — R918 Other nonspecific abnormal finding of lung field: Secondary | ICD-10-CM | POA: Insufficient documentation

## 2025-01-01 ENCOUNTER — Ambulatory Visit: Admitting: Acute Care

## 2025-01-29 ENCOUNTER — Ambulatory Visit
